# Patient Record
Sex: Female | Born: 1945 | Race: White | Hispanic: No | Smoking: Former smoker
Health system: Southern US, Community
[De-identification: ages and names within clinical notes are randomized; demographics above are authoritative.]

## PROBLEM LIST (undated history)

## (undated) DIAGNOSIS — E785 Hyperlipidemia, unspecified: Secondary | ICD-10-CM

## (undated) DIAGNOSIS — N189 Chronic kidney disease, unspecified: Secondary | ICD-10-CM

## (undated) DIAGNOSIS — I509 Heart failure, unspecified: Secondary | ICD-10-CM

## (undated) DIAGNOSIS — J45909 Unspecified asthma, uncomplicated: Secondary | ICD-10-CM

## (undated) DIAGNOSIS — I10 Essential (primary) hypertension: Secondary | ICD-10-CM

## (undated) DIAGNOSIS — I4891 Unspecified atrial fibrillation: Secondary | ICD-10-CM

## (undated) DIAGNOSIS — J449 Chronic obstructive pulmonary disease, unspecified: Secondary | ICD-10-CM

## (undated) HISTORY — PX: OTHER SURGICAL HISTORY: SHX169

---

## 2008-10-21 ENCOUNTER — Ambulatory Visit: Payer: Self-pay | Admitting: Family Medicine

## 2011-08-04 ENCOUNTER — Ambulatory Visit: Payer: Self-pay | Admitting: Internal Medicine

## 2011-11-06 ENCOUNTER — Inpatient Hospital Stay: Payer: Self-pay | Admitting: Internal Medicine

## 2011-11-06 ENCOUNTER — Ambulatory Visit: Payer: Self-pay

## 2012-06-02 ENCOUNTER — Ambulatory Visit: Payer: Self-pay

## 2013-01-31 ENCOUNTER — Ambulatory Visit: Payer: Self-pay | Admitting: Family Medicine

## 2015-10-20 ENCOUNTER — Other Ambulatory Visit: Payer: Self-pay | Admitting: Internal Medicine

## 2015-10-20 ENCOUNTER — Ambulatory Visit
Admission: RE | Admit: 2015-10-20 | Discharge: 2015-10-20 | Disposition: A | Discharge status: Home / Self Care | Payer: BLUE CROSS/BLUE SHIELD | Source: Ambulatory Visit | Attending: Internal Medicine | Admitting: Internal Medicine

## 2015-10-20 DIAGNOSIS — I517 Cardiomegaly: Secondary | ICD-10-CM | POA: Diagnosis not present

## 2015-10-20 DIAGNOSIS — J189 Pneumonia, unspecified organism: Secondary | ICD-10-CM | POA: Diagnosis not present

## 2015-10-20 DIAGNOSIS — R0602 Shortness of breath: Secondary | ICD-10-CM | POA: Diagnosis present

## 2015-10-20 DIAGNOSIS — J90 Pleural effusion, not elsewhere classified: Secondary | ICD-10-CM | POA: Insufficient documentation

## 2015-10-20 DIAGNOSIS — J449 Chronic obstructive pulmonary disease, unspecified: Secondary | ICD-10-CM | POA: Insufficient documentation

## 2015-11-07 ENCOUNTER — Ambulatory Visit
Admission: RE | Admit: 2015-11-07 | Discharge: 2015-11-07 | Disposition: A | Discharge status: Home / Self Care | Payer: BLUE CROSS/BLUE SHIELD | Source: Ambulatory Visit | Attending: Internal Medicine | Admitting: Internal Medicine

## 2015-11-07 ENCOUNTER — Other Ambulatory Visit: Payer: Self-pay | Admitting: Internal Medicine

## 2015-11-07 DIAGNOSIS — R9389 Abnormal findings on diagnostic imaging of other specified body structures: Secondary | ICD-10-CM

## 2015-11-07 DIAGNOSIS — J439 Emphysema, unspecified: Secondary | ICD-10-CM | POA: Diagnosis not present

## 2015-11-07 DIAGNOSIS — R918 Other nonspecific abnormal finding of lung field: Secondary | ICD-10-CM | POA: Insufficient documentation

## 2015-11-28 ENCOUNTER — Emergency Department: Payer: Medicare Other

## 2015-11-28 ENCOUNTER — Inpatient Hospital Stay
Admission: EM | Admit: 2015-11-28 | Discharge: 2015-11-30 | DRG: 190 | Disposition: A | Discharge status: Home / Self Care | Payer: Medicare Other | Attending: Internal Medicine | Admitting: Internal Medicine

## 2015-11-28 ENCOUNTER — Encounter: Payer: Self-pay | Admitting: Emergency Medicine

## 2015-11-28 DIAGNOSIS — J9602 Acute respiratory failure with hypercapnia: Secondary | ICD-10-CM | POA: Diagnosis present

## 2015-11-28 DIAGNOSIS — Z801 Family history of malignant neoplasm of trachea, bronchus and lung: Secondary | ICD-10-CM | POA: Diagnosis not present

## 2015-11-28 DIAGNOSIS — Z8249 Family history of ischemic heart disease and other diseases of the circulatory system: Secondary | ICD-10-CM

## 2015-11-28 DIAGNOSIS — Z7901 Long term (current) use of anticoagulants: Secondary | ICD-10-CM

## 2015-11-28 DIAGNOSIS — I509 Heart failure, unspecified: Secondary | ICD-10-CM | POA: Diagnosis present

## 2015-11-28 DIAGNOSIS — F17210 Nicotine dependence, cigarettes, uncomplicated: Secondary | ICD-10-CM | POA: Diagnosis present

## 2015-11-28 DIAGNOSIS — G4730 Sleep apnea, unspecified: Secondary | ICD-10-CM | POA: Diagnosis present

## 2015-11-28 DIAGNOSIS — E785 Hyperlipidemia, unspecified: Secondary | ICD-10-CM | POA: Diagnosis present

## 2015-11-28 DIAGNOSIS — J9601 Acute respiratory failure with hypoxia: Secondary | ICD-10-CM | POA: Diagnosis present

## 2015-11-28 DIAGNOSIS — J441 Chronic obstructive pulmonary disease with (acute) exacerbation: Secondary | ICD-10-CM | POA: Diagnosis present

## 2015-11-28 DIAGNOSIS — J189 Pneumonia, unspecified organism: Secondary | ICD-10-CM | POA: Diagnosis present

## 2015-11-28 DIAGNOSIS — I4891 Unspecified atrial fibrillation: Secondary | ICD-10-CM | POA: Diagnosis present

## 2015-11-28 DIAGNOSIS — R0602 Shortness of breath: Secondary | ICD-10-CM | POA: Diagnosis present

## 2015-11-28 DIAGNOSIS — I429 Cardiomyopathy, unspecified: Secondary | ICD-10-CM | POA: Diagnosis present

## 2015-11-28 DIAGNOSIS — R002 Palpitations: Secondary | ICD-10-CM | POA: Diagnosis present

## 2015-11-28 DIAGNOSIS — J45909 Unspecified asthma, uncomplicated: Secondary | ICD-10-CM | POA: Diagnosis present

## 2015-11-28 DIAGNOSIS — N189 Chronic kidney disease, unspecified: Secondary | ICD-10-CM | POA: Diagnosis present

## 2015-11-28 DIAGNOSIS — J90 Pleural effusion, not elsewhere classified: Secondary | ICD-10-CM | POA: Diagnosis present

## 2015-11-28 HISTORY — DX: Essential (primary) hypertension: I10

## 2015-11-28 HISTORY — DX: Unspecified asthma, uncomplicated: J45.909

## 2015-11-28 HISTORY — DX: Chronic obstructive pulmonary disease, unspecified: J44.9

## 2015-11-28 HISTORY — DX: Heart failure, unspecified: I50.9

## 2015-11-28 LAB — COMPREHENSIVE METABOLIC PANEL
ALK PHOS: 68 U/L (ref 38–126)
ALT: 25 U/L (ref 14–54)
ANION GAP: 9 (ref 5–15)
AST: 20 U/L (ref 15–41)
Albumin: 3.8 g/dL (ref 3.5–5.0)
BUN: 18 mg/dL (ref 6–20)
CALCIUM: 9.6 mg/dL (ref 8.9–10.3)
CHLORIDE: 99 mmol/L — AB (ref 101–111)
CO2: 37 mmol/L — AB (ref 22–32)
CREATININE: 0.74 mg/dL (ref 0.44–1.00)
Glucose, Bld: 110 mg/dL — ABNORMAL HIGH (ref 65–99)
Potassium: 4.7 mmol/L (ref 3.5–5.1)
SODIUM: 145 mmol/L (ref 135–145)
Total Bilirubin: 0.7 mg/dL (ref 0.3–1.2)
Total Protein: 6.9 g/dL (ref 6.5–8.1)

## 2015-11-28 LAB — CBC WITH DIFFERENTIAL/PLATELET
BASOS ABS: 0.1 10*3/uL (ref 0–0.1)
Basophils Relative: 1 %
EOS ABS: 0 10*3/uL (ref 0–0.7)
EOS PCT: 0 %
HCT: 45.3 % (ref 35.0–47.0)
HEMOGLOBIN: 14.2 g/dL (ref 12.0–16.0)
LYMPHS ABS: 0.9 10*3/uL — AB (ref 1.0–3.6)
Lymphocytes Relative: 8 %
MCH: 30.2 pg (ref 26.0–34.0)
MCHC: 31.3 g/dL — ABNORMAL LOW (ref 32.0–36.0)
MCV: 96.6 fL (ref 80.0–100.0)
Monocytes Absolute: 0.4 10*3/uL (ref 0.2–0.9)
Monocytes Relative: 4 %
NEUTROS PCT: 87 %
Neutro Abs: 10.2 10*3/uL — ABNORMAL HIGH (ref 1.4–6.5)
PLATELETS: 337 10*3/uL (ref 150–440)
RBC: 4.69 MIL/uL (ref 3.80–5.20)
RDW: 17.5 % — ABNORMAL HIGH (ref 11.5–14.5)
WBC: 11.6 10*3/uL — AB (ref 3.6–11.0)

## 2015-11-28 LAB — BLOOD GAS, ARTERIAL
ALLENS TEST (PASS/FAIL): POSITIVE — AB
Acid-Base Excess: 8.1 mmol/L — ABNORMAL HIGH (ref 0.0–3.0)
Bicarbonate: 37.6 mEq/L — ABNORMAL HIGH (ref 21.0–28.0)
FIO2: 0.24
O2 SAT: 91.4 %
PCO2 ART: 80 mmHg — AB (ref 32.0–48.0)
PH ART: 7.28 — AB (ref 7.350–7.450)
Patient temperature: 37
pO2, Arterial: 70 mmHg — ABNORMAL LOW (ref 83.0–108.0)

## 2015-11-28 LAB — TROPONIN I
TROPONIN I: 0.05 ng/mL — AB (ref <0.031)
TROPONIN I: 0.05 ng/mL — AB (ref <0.031)
TROPONIN I: 0.04 ng/mL — AB (ref <0.031)

## 2015-11-28 LAB — LACTIC ACID, PLASMA
LACTIC ACID, VENOUS: 1 mmol/L (ref 0.5–2.0)
LACTIC ACID, VENOUS: 1 mmol/L (ref 0.5–2.0)

## 2015-11-28 LAB — GLUCOSE, CAPILLARY: GLUCOSE-CAPILLARY: 98 mg/dL (ref 65–99)

## 2015-11-28 LAB — MRSA PCR SCREENING: MRSA BY PCR: NEGATIVE

## 2015-11-28 MED ORDER — IPRATROPIUM-ALBUTEROL 0.5-2.5 (3) MG/3ML IN SOLN
3.0000 mL | Freq: Once | RESPIRATORY_TRACT | Status: AC
  Administered 2015-11-28: 3 mL via RESPIRATORY_TRACT

## 2015-11-28 MED ORDER — TIOTROPIUM BROMIDE MONOHYDRATE 18 MCG IN CAPS
1.0000 | ORAL_CAPSULE | Freq: Every day | RESPIRATORY_TRACT | Status: DC
  Administered 2015-11-28 – 2015-11-30 (×2): 18 ug via RESPIRATORY_TRACT
  Filled 2015-11-28 (×2): qty 5

## 2015-11-28 MED ORDER — FUROSEMIDE 10 MG/ML IJ SOLN
60.0000 mg | Freq: Once | INTRAMUSCULAR | Status: AC
  Administered 2015-11-28: 60 mg via INTRAVENOUS
  Filled 2015-11-28: qty 8

## 2015-11-28 MED ORDER — SODIUM CHLORIDE 0.9 % IV BOLUS (SEPSIS)
1000.0000 mL | Freq: Once | INTRAVENOUS | Status: AC
  Administered 2015-11-28: 1000 mL via INTRAVENOUS

## 2015-11-28 MED ORDER — METHYLPREDNISOLONE SODIUM SUCC 125 MG IJ SOLR
125.0000 mg | Freq: Once | INTRAMUSCULAR | Status: AC
  Administered 2015-11-28: 125 mg via INTRAVENOUS
  Filled 2015-11-28: qty 2

## 2015-11-28 MED ORDER — DABIGATRAN ETEXILATE MESYLATE 75 MG PO CAPS
150.0000 mg | ORAL_CAPSULE | Freq: Two times a day (BID) | ORAL | Status: DC
  Administered 2015-11-28 – 2015-11-30 (×4): 150 mg via ORAL
  Filled 2015-11-28 (×3): qty 2

## 2015-11-28 MED ORDER — FUROSEMIDE 20 MG PO TABS
20.0000 mg | ORAL_TABLET | Freq: Every day | ORAL | Status: DC
  Administered 2015-11-29 – 2015-11-30 (×2): 20 mg via ORAL
  Filled 2015-11-28 (×2): qty 1

## 2015-11-28 MED ORDER — LEVOFLOXACIN IN D5W 750 MG/150ML IV SOLN
750.0000 mg | Freq: Once | INTRAVENOUS | Status: AC
  Administered 2015-11-28: 750 mg via INTRAVENOUS
  Filled 2015-11-28: qty 150

## 2015-11-28 MED ORDER — PROPRANOLOL HCL 20 MG PO TABS
40.0000 mg | ORAL_TABLET | Freq: Two times a day (BID) | ORAL | Status: DC
  Administered 2015-11-28 – 2015-11-30 (×5): 40 mg via ORAL
  Filled 2015-11-28 (×2): qty 2
  Filled 2015-11-28: qty 4
  Filled 2015-11-28 (×2): qty 2
  Filled 2015-11-28: qty 4

## 2015-11-28 MED ORDER — ACETAMINOPHEN 325 MG PO TABS: 650.0000 mg | ORAL_TABLET | Freq: Four times a day (QID) | ORAL | Status: DC | PRN

## 2015-11-28 MED ORDER — SODIUM CHLORIDE 0.9 % IJ SOLN
3.0000 mL | Freq: Two times a day (BID) | INTRAMUSCULAR | Status: DC
  Administered 2015-11-29 – 2015-11-30 (×4): 3 mL via INTRAVENOUS

## 2015-11-28 MED ORDER — VITAMIN D 1000 UNITS PO TABS
1000.0000 [IU] | ORAL_TABLET | Freq: Every day | ORAL | Status: DC
  Administered 2015-11-28 – 2015-11-30 (×3): 1000 [IU] via ORAL
  Filled 2015-11-28 (×3): qty 1

## 2015-11-28 MED ORDER — LISINOPRIL 5 MG PO TABS
2.5000 mg | ORAL_TABLET | Freq: Every day | ORAL | Status: DC
Start: 2015-11-28 — End: 2015-11-30
  Administered 2015-11-28 – 2015-11-29 (×2): 2.5 mg via ORAL
  Filled 2015-11-28: qty 1
  Filled 2015-11-28: qty 3

## 2015-11-28 MED ORDER — DILTIAZEM HCL ER COATED BEADS 240 MG PO CP24
240.0000 mg | ORAL_CAPSULE | Freq: Every day | ORAL | Status: DC
  Administered 2015-11-28 – 2015-11-30 (×3): 240 mg via ORAL
  Filled 2015-11-28: qty 2
  Filled 2015-11-28 (×2): qty 1

## 2015-11-28 MED ORDER — DABIGATRAN ETEXILATE MESYLATE 75 MG PO CAPS
150.0000 mg | ORAL_CAPSULE | Freq: Once | ORAL | Status: DC
  Filled 2015-11-28: qty 2

## 2015-11-28 MED ORDER — METHYLPREDNISOLONE SODIUM SUCC 125 MG IJ SOLR
60.0000 mg | Freq: Four times a day (QID) | INTRAMUSCULAR | Status: DC
  Administered 2015-11-28 – 2015-11-29 (×4): 60 mg via INTRAVENOUS
  Filled 2015-11-28 (×4): qty 2

## 2015-11-28 MED ORDER — ATORVASTATIN CALCIUM 20 MG PO TABS
20.0000 mg | ORAL_TABLET | Freq: Every day | ORAL | Status: DC
  Administered 2015-11-28 – 2015-11-30 (×3): 20 mg via ORAL
  Filled 2015-11-28 (×3): qty 1

## 2015-11-28 MED ORDER — DILTIAZEM HCL 100 MG IV SOLR
10.0000 mg/h | Freq: Once | INTRAVENOUS | Status: AC
  Administered 2015-11-28: 10 mg/h via INTRAVENOUS
  Filled 2015-11-28: qty 100

## 2015-11-28 MED ORDER — IPRATROPIUM-ALBUTEROL 0.5-2.5 (3) MG/3ML IN SOLN
3.0000 mL | Freq: Four times a day (QID) | RESPIRATORY_TRACT | Status: DC
  Administered 2015-11-28 – 2015-11-30 (×8): 3 mL via RESPIRATORY_TRACT
  Filled 2015-11-28 (×8): qty 3

## 2015-11-28 MED ORDER — DILTIAZEM HCL 25 MG/5ML IV SOLN
10.0000 mg | Freq: Once | INTRAVENOUS | Status: AC
  Administered 2015-11-28: 10 mg via INTRAVENOUS
  Filled 2015-11-28: qty 5

## 2015-11-28 MED ORDER — DEXTROSE 5 % IV SOLN
5.0000 mg/h | Freq: Once | INTRAVENOUS | Status: AC
  Administered 2015-11-28: 5 mg/h via INTRAVENOUS
  Filled 2015-11-28: qty 100

## 2015-11-28 MED ORDER — MONTELUKAST SODIUM 10 MG PO TABS
10.0000 mg | ORAL_TABLET | Freq: Every day | ORAL | Status: DC
  Administered 2015-11-28 – 2015-11-30 (×3): 10 mg via ORAL
  Filled 2015-11-28 (×3): qty 1

## 2015-11-28 MED ORDER — DILTIAZEM HCL 25 MG/5ML IV SOLN
10.0000 mg | Freq: Once | INTRAVENOUS | Status: AC
  Administered 2015-11-28: 10 mg via INTRAVENOUS

## 2015-11-28 MED ORDER — BUDESONIDE 0.25 MG/2ML IN SUSP
0.2500 mg | Freq: Two times a day (BID) | RESPIRATORY_TRACT | Status: DC
  Administered 2015-11-28 – 2015-11-30 (×5): 0.25 mg via RESPIRATORY_TRACT
  Filled 2015-11-28 (×5): qty 2

## 2015-11-28 MED ORDER — ADULT MULTIVITAMIN W/MINERALS CH
1.0000 | ORAL_TABLET | Freq: Every day | ORAL | Status: DC
  Administered 2015-11-28 – 2015-11-30 (×3): 1 via ORAL
  Filled 2015-11-28 (×3): qty 1

## 2015-11-28 MED ORDER — LEVOFLOXACIN IN D5W 500 MG/100ML IV SOLN
500.0000 mg | INTRAVENOUS | Status: DC
  Filled 2015-11-28: qty 100

## 2015-11-28 MED ORDER — IPRATROPIUM-ALBUTEROL 0.5-2.5 (3) MG/3ML IN SOLN
RESPIRATORY_TRACT | Status: AC
  Filled 2015-11-28: qty 9

## 2015-11-28 MED ORDER — ACETAMINOPHEN 650 MG RE SUPP: 650.0000 mg | Freq: Four times a day (QID) | RECTAL | Status: DC | PRN

## 2015-11-28 NOTE — H&P (Signed)
[place] [facility] [place] NAME: Elaine Decker    MR#:  [id number]  DATE OF BIRTH:  10/14/1946  DATE OF ADMISSION:  11/28/2015  PRIMARY CARE PHYSICIAN: Glendon Axe, MD   REQUESTING/REFERRING PHYSICIAN: Dr Kerman Passey  CHIEF COMPLAINT:   Chief Complaint  Patient presents with  . Shortness of Breath    HISTORY OF PRESENT ILLNESS:  Elaine Decker  is a 69 y.o. female presented with shortness of breath. She states that she's been having trouble breathing for the last 3 weeks. Coughing up beige phlegm. Past few days feeling very fatigued. Hospitalist services were contacted for further evaluation when chest x-ray showed right lower lobe pneumonia and patient was also in rapid atrial fibrillation. The patient has also had some trouble with her vision and she saw her eye doctor recently. She's been having palpitations, cough wheeze and shortness of breath.  PAST MEDICAL HISTORY:   Past Medical History  Diagnosis Date  . COPD (chronic obstructive pulmonary disease) ([place])   . Hypertension   . Asthma   . CHF (congestive heart failure) ([place])     PAST SURGICAL HISTORY:   Past Surgical History  Procedure Laterality Date  . Surgery on the neck      SOCIAL HISTORY:   Social History  Substance Use Topics  . Smoking status: Current Every Day Smoker -- 1.00 packs/day  . Smokeless tobacco: Not on file  . Alcohol Use: No    FAMILY HISTORY:   Family History  Problem Relation Age of Onset  . CAD Mother   . Lung cancer Father     DRUG ALLERGIES:  No Known Allergies  REVIEW OF SYSTEMS:  CONSTITUTIONAL: No fever, positive for fatigue EYES: No blurred or double vision. She's been having some 3 the vision out of her right eye which she saw her eye doctor for and she was referred to a specialist EARS, NOSE, AND THROAT: No tinnitus or ear pain. No sore throat RESPIRATORY: Positive for cough, shortness of breath, and wheezing. No  hemoptysis.  CARDIOVASCULAR: No chest pain. Positive for palpitations GASTROINTESTINAL: No nausea, vomiting, diarrhea or abdominal pain. No blood in bowel movements GENITOURINARY: No dysuria, hematuria.  ENDOCRINE: No polyuria, nocturia,  HEMATOLOGY: No anemia, easy bruising or bleeding SKIN: No rash or lesion. MUSCULOSKELETAL: No joint pain or arthritis.   NEUROLOGIC: No tingling, numbness, weakness.  PSYCHIATRY: No anxiety or depression.   MEDICATIONS AT HOME:   Prior to Admission medications   Medication Sig Start Date End Date Taking? Authorizing Provider  ADVAIR DISKUS 250-50 MCG/DOSE AEPB Take 2 puffs by mouth 2 (two) times daily. 10/28/15  Yes Historical Provider, MD  atorvastatin (LIPITOR) 20 MG tablet Take 20 mg by mouth daily. 11/09/15  Yes Historical Provider, MD  CARTIA XT 240 MG 24 hr capsule Take 1 capsule by mouth daily. 11/05/15  Yes Historical Provider, MD  cholecalciferol (VITAMIN D) 1000 UNITS tablet Take 1,000 Units by mouth daily.   Yes Historical Provider, MD  furosemide (LASIX) 20 MG tablet Take 1 tablet by mouth daily. 11/14/15  Yes Historical Provider, MD  lisinopril (PRINIVIL,ZESTRIL) 2.5 MG tablet Take 1 tablet by mouth daily. 11/07/15  Yes Historical Provider, MD  montelukast (SINGULAIR) 10 MG tablet Take 1 tablet by mouth daily. 11/07/15  Yes Historical Provider, MD  Multiple Vitamin (MULTIVITAMIN) tablet Take 1 tablet by mouth daily.   Yes Historical Provider, MD  PRADAXA 150 MG CAPS capsule Take 1 capsule by mouth 2 (two) times  daily. 11/05/15  Yes Historical Provider, MD  propranolol (INDERAL) 40 MG tablet Take 1 tablet by mouth 2 (two) times daily. 09/13/15  Yes Historical Provider, MD  SPIRIVA RESPIMAT 1.25 MCG/ACT AERS Take 1 puff by mouth daily. 11/02/15  Yes Historical Provider, MD  VENTOLIN HFA 108 (90 BASE) MCG/ACT inhaler Take 2 puffs by mouth every 4 (four) hours as needed. 11/05/15  Yes Historical Provider, MD      VITAL SIGNS:  Blood pressure  141/88, pulse 104, temperature 98.7 F (37.1 C), temperature source Oral, resp. rate 19, height 5\' 3"  (1.6 m), weight 79.833 kg (176 lb), SpO2 93 %. Heart weight when I saw her was 155 PHYSICAL EXAMINATION:  GENERAL:  69 y.o.-year-old patient lying in the bed with no acute distress.  EYES: Pupils equal, round, reactive to light and accommodation. No scleral icterus. Extraocular muscles intact.  HEENT: Head atraumatic, normocephalic. Oropharynx and nasopharynx clear.  NECK:  Supple, no jugular venous distention. No thyroid enlargement, no tenderness.  LUNGS: Decreased breath sounds bilaterally, positive wheezing, no rales,rhonchi or crepitation. No use of accessory muscles of respiration.  CARDIOVASCULAR: S1, S2 irregularly irregular tachycardic. 2/6 systolic murmur, no rubs, or gallops.  ABDOMEN: Soft, nontender, nondistended. Bowel sounds present. No organomegaly or mass.  EXTREMITIES: 2+ pedal edema, no cyanosis, or clubbing.  NEUROLOGIC: Cranial nerves II through XII are intact. Muscle strength 5/5 in all extremities. Sensation intact. Gait not checked.  PSYCHIATRIC: The patient is alert and oriented x 3.  SKIN: Chronic lower extremity discoloration  LABORATORY PANEL:   CBC  Recent Labs Lab 11/28/15 0923  WBC 11.6*  HGB 14.2  HCT 45.3  PLT 337   ------------------------------------------------------------------------------------------------------------------  Chemistries   Recent Labs Lab 11/28/15 0923  NA 145  K 4.7  CL 99*  CO2 37*  GLUCOSE 110*  BUN 18  CREATININE 0.74  CALCIUM 9.6  AST 20  ALT 25  ALKPHOS 68  BILITOT 0.7   ------------------------------------------------------------------------------------------------------------------  Cardiac Enzymes  Recent Labs Lab 11/28/15 0923  TROPONINI 0.05*   ------------------------------------------------------------------------------------------------------------------  RADIOLOGY:  Dg Chest Portable 1  View  11/28/2015  CLINICAL DATA:  Short of breath EXAM: PORTABLE CHEST 1 VIEW COMPARISON:  11/07/2015 FINDINGS: Progression of right lower lobe airspace disease and right pleural effusion. Mild left lower lobe atelectasis and possibly minimal effusion Mild vascular congestion without edema. IMPRESSION: Right lower lobe airspace disease and right effusion has developed since the prior study. This may represent pneumonia or possibly heart failure. Mild left lower lobe atelectasis and minimal effusion. Electronically Signed   By: Franchot Gallo M.D.   On: 11/28/2015 09:47    EKG:   Atrial fibrillation rapid ventricular response  IMPRESSION AND PLAN:   1. Acute hypoxic hypercarbic respiratory failure. Patient placed on oxygen supplementation. Will put in for a consult for Dr. Devona Konig her pulmonologist 2. Rapid atrial fibrillation- patient was placed on a Cardizem drip in the ER and will titrate up to 15 mg per hour. Patient also takes low-dose beta blocker which I will restart. I will also try to restart her oral Cardizem to try to get off the drip. Cardiology consult with her cardiologist Dr. Lorinda Creed 3. COPD exacerbation, right lower lobe pneumonia- start IV Solu-Medrol, budesonide nebulizers, DuoNeb nebulizers and IV Levaquin. 4. History of sleep apnea- I will try BiPAP at night with her CO2 retention. May be candidate for Trilogy at home. 5. Tobacco abuse smoking cessation counseling done 3 minutes by me. Patient refused nicotine patch. 6. Essential  hypertension continue usual medications 7. History of congestive heart failure- ER physician gave a dose of Lasix but I think this is more secondary to COPD exacerbation and pneumonia.  All the records are reviewed and case discussed with ED provider. Management plans discussed with the patient, family and they are in agreement.  CODE STATUS: Full code  TOTAL TIME TAKING CARE OF THIS PATIENT: 50 minutes, patient will be admitted to the  critical care unit.    Loletha Grayer M.D on 11/28/2015 at 2:19 PM  Between 7am to 6pm - Pager - [phone]  After 6pm call admission pager [facility]  Office  [phone]  CC: Primary care physician; Glendon Axe, MD

## 2015-11-28 NOTE — ED Provider Notes (Signed)
[facility] Emergency Department Provider Note  Time seen: 9:30 AM  I have reviewed the triage vital signs and the nursing notes.   HISTORY  Chief Complaint Shortness of Breath    HPI Elaine Decker is a 69 y.o. female with a past medical history of CHF, CK D, COPD on 2 L O2 24/7, atrial fibrillation on Pradaxa, who presents to the emergency department with shortness of breath. According to the patient for the past 2 days she has been progressively more short of breath. Mild cough but states that is baseline for her. Patient only able to talk in 1-2 word sentences due to shortness of breath. Denies fever. Denies chest pain. On arrival to the emergency department and the patient has a oxygen saturation of 74% on 2 L O2.    PMHx: . Atrial fibrillation  . CHF (congestive heart failure)  . Chronic kidney disease  Chronic renal insufficiency  . COPD (chronic obstructive pulmonary disease)  . Hx of myocardial perfusion scan 05/01/07  EF 59%, normal. Sub max exercise.  Mar[place] Kitchen Hyperlipidemia  . Hypertension    There are no active problems to display for this patient.   No past surgical history on file.  No current outpatient prescriptions on file.  Allergies Review of patient's allergies indicates not on file.  No family history on file.  Social History Social History  Substance Use Topics  . Smoking status: Not on file  . Smokeless tobacco: Not on file  . Alcohol Use: Not on file    Review of Systems Constitutional: Negative for fever Cardiovascular: Negative for chest pain. Respiratory: Positive for shortness of breath. Gastrointestinal: Negative for abdominal pain Musculoskeletal: Negative for back pain. Neurological: Negative for headache 10-point ROS otherwise negative.  ____________________________________________   PHYSICAL EXAM:  VITAL SIGNS: ED Triage Vitals  Enc Vitals Group     BP 11/28/15 0917 133/78 mmHg     Pulse Rate  11/28/15 0917 76     Resp 11/28/15 0917 22     Temp 11/28/15 0917 98.9 F (37.2 C)     Temp Source 11/28/15 0917 Oral     SpO2 11/28/15 0917 86 %     Weight 11/28/15 0917 173 lb (78.472 kg)     Height 11/28/15 0917 5\' 3"  (1.6 m)     Head Cir --      Peak Flow --      Pain Score 11/28/15 0929 8     Pain Loc --      Pain Edu? --      Excl. in [place]? --     Constitutional: Alert and oriented. Well appearing and in no distress. Eyes: Normal exam ENT   Head: Normocephalic and atraumatic   Mouth/Throat: Mucous membranes are moist. Cardiovascular: Irregular rhythm, rate around 150 bpm Respiratory: Mild tachypnea, very diminished breath sounds bilaterally. No obvious wheezes, rales, rhonchi. Gastrointestinal: Soft and nontender. No distention.   Musculoskeletal: Nontender with normal range of motion in all extremities. 2+ lower extremity edema bilaterally. Neurologic:  Normal speech and language. No gross focal neurologic deficits are appreciated. Speech is normal. Skin:  Skin is warm, dry and intact.  Psychiatric: Mood and affect are normal. Speech and behavior are normal.  ____________________________________________    EKG  EKG reviewed and interpreted by myself shows atrial fibrillation at 153 bpm, narrow QRS, normal axis, nonspecific ST changes present. Mild lateral ST depressions likely due to demand ischemia. No ST elevation noted  ____________________________________________  RADIOLOGY  Right lower airspace disease, effusion versus pneumonia versus edema  ____________________________________________  CRITICAL CARE Performed by: Harvest Dark   Total critical care time: 30 minutes  Critical care time was exclusive of separately billable procedures and treating other patients.  Critical care was necessary to treat or prevent imminent or life-threatening deterioration.  Critical care was time spent personally by me on the following activities: development  of treatment plan with patient and/or surrogate as well as nursing, discussions with consultants, evaluation of patient's response to treatment, examination of patient, obtaining history from patient or surrogate, ordering and performing treatments and interventions, ordering and review of laboratory studies, ordering and review of radiographic studies, pulse oximetry and re-evaluation of patient's condition.      INITIAL IMPRESSION / ASSESSMENT AND PLAN / ED COURSE  Pertinent labs & imaging results that were available during my care of the patient were reviewed by me and considered in my medical decision making (see chart for details).  Patient presents the emergency department with dyspnea 2 days. Patient has very diminished breath sounds bilaterally, 74% oxygen saturation on 2 L O2. Patient has a lower extremity edema, history of COPD as well as CHF, it is unclear which she is suffering from today. I have ordered BiPAP, we will begin with DuoNeb times, we will closely monitor in the emergency department while awaiting lab results.  ----------------------------------------- 9:54 AM on 11/28/2015 -----------------------------------------  Heart rate continues to be elevated most consistent with age of fibrillation with rapid ventricular response. Blood pressure is elevated in the 140s, we will give diltiazem 10 mg IV and continue to closely monitor.  ----------------------------------------- 10:21 AM on 11/28/2015 -----------------------------------------  Patient feeling much better after breathing treatments, currently satting 90-93 percent on 2 L. I have canceled the BiPAP. Given the vast improvement after breathing treatments, most likely COPD exacerbation. Chest x-ray reading as possible infiltrate versus edema and right lung, patient states she was recently treated for pneumonia 3 weeks ago, which this could be related. We'll cover with antibiotics, Solu-Medrol, and continue to monitor  closely in the emergency department. The patient's heart rate briefly came down after diltiazem push, will repeat push, if unimproved after second bolus we'll place the patient on a drip.  Sometimes in patient's heart rate remains in the 130s/140s status post 2 rounds of IV diltiazem. We'll start on an IV diltiazem drip at this time. Patient is doing much better from a respiratory standpoint currently satting 95-96 percent on 4 L. We will cover with antibiotics, admit to the hospital for age of fibrillation with rapid ventricular response and COPD exacerbation.  ____________________________________________   FINAL CLINICAL IMPRESSION(S) / ED DIAGNOSES  Dyspnea COPD exacerbation Atrial fibrillation with rapid ventricular response  Harvest Dark, MD 11/28/15 1110

## 2015-11-28 NOTE — Consult Note (Signed)
[facility] NOTE  Patient ID: Elaine Decker MRN: [id number] DOB/AGE: 1946/07/11 69 y.o.  Admit date: 11/28/2015 Referring Physician Dr. Leslye Peer Primary Physician Dr. Lilla Shook Primary Cardiologist Dr. 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  Patient recently been treated for pneumonia.  She began noting an irregular heartbeat present to emergency room where she was noted be in atrial fibrillation with rapid ventricular response.  At home she had been treated with lisinopril and cardia XT.  She had been anticoagulated with Pradaxa.  She was placed on a Cardizem drip.  Currently is in atrial fibrillation with a rate of approximately 70 May, 1985. She continues to smoke cigarettes.  She is currently hemodynamically stable.  ROS Review of Systems - History obtained from chart review and the patient General ROS: positive for  - chills Respiratory ROS: positive for - shortness of breath Cardiovascular ROS: positive for - dyspnea on exertion, palpitations, rapid heart rate and shortness of breath Gastrointestinal ROS: no abdominal pain, change in bowel habits, or black or bloody stools Musculoskeletal ROS: negative Neurological ROS: no TIA or stroke symptoms   Past Medical History  Diagnosis Date  . COPD (chronic obstructive pulmonary disease) ([place])   . Hypertension   . Asthma   . CHF (congestive heart failure) ([facility])     Family History  Problem Relation Age of Onset  . CAD Mother   . Lung cancer Father     Social History   Social History  . Marital Status: Divorced    Spouse Name: N/A  . Number of Children: N/A  . Years of Education: N/A   Occupational History  . Not on file.   Social History Main Topics  . Smoking status: Current Every Day Smoker -- 1.00  packs/day  . Smokeless tobacco: Not on file  . Alcohol Use: No  . Drug Use: Not on file  . Sexual Activity: Not on file   Other Topics Concern  . Not on file   Social History Narrative  . No narrative on file    Past Surgical History  Procedure Laterality Date  . Surgery on the neck       Prescriptions prior to admission  Medication Sig Dispense Refill Last Dose  . ADVAIR DISKUS 250-50 MCG/DOSE AEPB Take 2 puffs by mouth 2 (two) times daily.  5 11/27/2015 at Unknown time  . atorvastatin (LIPITOR) 20 MG tablet Take 20 mg by mouth daily.  6 11/27/2015 at Unknown time  . CARTIA XT 240 MG 24 hr capsule Take 1 capsule by mouth daily.  6 11/27/2015 at Unknown time  . cholecalciferol (VITAMIN D) 1000 UNITS tablet Take 1,000 Units by mouth daily.   11/27/2015 at Unknown time  . furosemide (LASIX) 20 MG tablet Take 1 tablet by mouth daily.  2 11/27/2015 at Unknown time  . lisinopril (PRINIVIL,ZESTRIL) 2.5 MG tablet Take 1 tablet by mouth daily.  6 11/27/2015 at Unknown time  . montelukast (SINGULAIR) 10 MG tablet Take 1 tablet by mouth daily.  5 11/27/2015 at Unknown time  . Multiple Vitamin (MULTIVITAMIN) tablet Take 1 tablet by mouth daily.   11/27/2015 at Unknown time  . PRADAXA 150 MG CAPS capsule Take 1 capsule by mouth 2 (two) times daily.  6 11/27/2015 at Unknown time  . propranolol (INDERAL)  40 MG tablet Take 1 tablet by mouth 2 (two) times daily.  3 11/27/2015 at Unknown time  . SPIRIVA RESPIMAT 1.25 MCG/ACT AERS Take 1 puff by mouth daily.  5 11/27/2015 at Unknown time  . VENTOLIN HFA 108 (90 BASE) MCG/ACT inhaler Take 2 puffs by mouth every 4 (four) hours as needed.  5 11/27/2015 at Unknown time    Physical Exam: Blood pressure 118/70, pulse 89, temperature 98.4 F (36.9 C), temperature source Oral, resp. rate 19, height 5\' 3"  (1.6 m), weight 79.833 kg (176 lb), SpO2 92 %.    General appearance: alert and cooperative Resp: rhonchi bibasilar Cardio: irregularly irregular  rhythm GI: soft, non-tender; bowel sounds normal; no masses,  no organomegaly Extremities: extremities normal, atraumatic, no cyanosis or edema Neurologic: Grossly normal Labs:   Lab Results  Component Value Date   WBC 11.6* 11/28/2015   HGB 14.2 11/28/2015   HCT 45.3 11/28/2015   MCV 96.6 11/28/2015   PLT 337 11/28/2015    Recent Labs Lab 11/28/15 0923  NA 145  K 4.7  CL 99*  CO2 37*  BUN 18  CREATININE 0.74  CALCIUM 9.6  PROT 6.9  BILITOT 0.7  ALKPHOS 68  ALT 25  AST 20  GLUCOSE 110*   Lab Results  Component Value Date   TROPONINI 0.04* 11/28/2015      Radiology:   Probable pneumonia with no pulmonary edema EKG:  Atrial fibrillation with variable ventricular response  ASSESSMENT AND PLAN:   Patient's history of paroxysmally atrial fibrillation who has been treated as an outpatient with cardia XT as well as anticoagulated with Pradaxa.  She a cardiomyopathy which is mild at 45%.  She was treated for pneumonia.  She noted her heart rate was somewhat irregular and presented to the emergency room where she was noted be in atrial fibrillation with rapid ventricular response.  Rate is being controlled with Cardizem IV.  Will wean off of IV Cardizem and continue with p.o. Cardizem using 30 mg of Cardizem short-acting for breakthrough.  Will continue with Pradaxa.  Transferred to telemetry in follow-up consider discharge when rate is stable. Signed: Teodoro Spray MD, [facility] 11/28/2015, 7:56 PM

## 2015-11-28 NOTE — ED Notes (Signed)
Reports sob and states she feels disoriented.

## 2015-11-28 NOTE — ED Notes (Signed)
While on 2L nasal cannula, pt desat to 74% when up to bedside commode; pt placed on 4L O2 to maintain sat >92%.

## 2015-11-28 NOTE — Progress Notes (Signed)
Cardizem drip discontinued per order Dr. Ubaldo Glassing.

## 2015-11-28 NOTE — ED Notes (Signed)
Past few days c/o sob, cough, family found pt somewhat disoriented, was not on her o2

## 2015-11-29 LAB — BASIC METABOLIC PANEL
Anion gap: 4 — ABNORMAL LOW (ref 5–15)
BUN: 20 mg/dL (ref 6–20)
CALCIUM: 9 mg/dL (ref 8.9–10.3)
CHLORIDE: 97 mmol/L — AB (ref 101–111)
CO2: 41 mmol/L — AB (ref 22–32)
CREATININE: 0.83 mg/dL (ref 0.44–1.00)
GFR calc Af Amer: >60 mL/min (ref >60)
GFR calc non Af Amer: >60 mL/min (ref >60)
GLUCOSE: 176 mg/dL — AB (ref 65–99)
Potassium: 5 mmol/L (ref 3.5–5.1)
Sodium: 142 mmol/L (ref 135–145)

## 2015-11-29 LAB — CBC
HCT: 42.5 % (ref 35.0–47.0)
Hemoglobin: 13.7 g/dL (ref 12.0–16.0)
MCH: 30.8 pg (ref 26.0–34.0)
MCHC: 32.3 g/dL (ref 32.0–36.0)
MCV: 95.3 fL (ref 80.0–100.0)
PLATELETS: 223 10*3/uL (ref 150–440)
RBC: 4.45 MIL/uL (ref 3.80–5.20)
RDW: 16.1 % — ABNORMAL HIGH (ref 11.5–14.5)
WBC: 5.7 10*3/uL (ref 3.6–11.0)

## 2015-11-29 MED ORDER — PREDNISONE 50 MG PO TABS
50.0000 mg | ORAL_TABLET | Freq: Every day | ORAL | Status: DC
  Administered 2015-11-30: 50 mg via ORAL
  Filled 2015-11-29: qty 1

## 2015-11-29 MED ORDER — AZITHROMYCIN 500 MG IV SOLR
250.0000 mg | INTRAVENOUS | Status: DC
  Administered 2015-11-29: 250 mg via INTRAVENOUS
  Filled 2015-11-29 (×2): qty 250

## 2015-11-29 MED ORDER — DEXTROSE 5 % IV SOLN
1.0000 g | INTRAVENOUS | Status: DC
  Administered 2015-11-29: 1 g via INTRAVENOUS
  Filled 2015-11-29 (×2): qty 10

## 2015-11-29 NOTE — Progress Notes (Signed)
A&O. Up with one assist. Slept with bipap on all night. No complaints. IV solumedrol given. On antibiotics.

## 2015-11-29 NOTE — Progress Notes (Signed)
[place]  SUBJECTIVE: Remains in atrial fib with controlled rate.  Patient is still short of breath with productive cough.  Will continue to control rate with oral medications and follow heart rate.  Would defer aggressive attempts at cardioversion until her pulmonary status stabilizes.  Continue with anticoagulation with Pradaxa.   Filed Vitals:   11/29/15 1100 11/29/15 1220 11/29/15 1959 11/29/15 2009  BP:  132/82 122/75   Pulse:  106 100   Temp:  97.6 F (36.4 C) 97.8 F (36.6 C)   TempSrc:  Oral Oral   Resp:  20 18   Height:      Weight:      SpO2: 95% 92% 91% 92%    Intake/Output Summary (Last 24 hours) at 11/29/15 2032 Last data filed at 11/29/15 1803  Gross per 24 hour  Intake    840 ml  Output   1275 ml  Net   -435 ml    LABS: Basic Metabolic Panel:  Recent Labs  11/28/15 0923 11/29/15 0504  NA 145 142  K 4.7 5.0  CL 99* 97*  CO2 37* 41*  GLUCOSE 110* 176*  BUN 18 20  CREATININE 0.74 0.83  CALCIUM 9.6 9.0   Liver Function Tests:  Recent Labs  11/28/15 0923  AST 20  ALT 25  ALKPHOS 68  BILITOT 0.7  PROT 6.9  ALBUMIN 3.8   No results for input(s): LIPASE, AMYLASE in the last 72 hours. CBC:  Recent Labs  11/28/15 0923 11/29/15 0504  WBC 11.6* 5.7  NEUTROABS 10.2*  --   HGB 14.2 13.7  HCT 45.3 42.5  MCV 96.6 95.3  PLT 337 223   Cardiac Enzymes:  Recent Labs  11/28/15 0923 11/28/15 1442 11/28/15 1802  TROPONINI 0.05* 0.05* 0.04*   BNP: Invalid input(s): POCBNP D-Dimer: No results for input(s): DDIMER in the last 72 hours. Hemoglobin A1C: No results for input(s): HGBA1C in the last 72 hours. Fasting Lipid Panel: No results for input(s): CHOL, HDL, LDLCALC, TRIG, CHOLHDL, LDLDIRECT in the last 72 hours. Thyroid Function Tests: No results for input(s): TSH, T4TOTAL, T3FREE, THYROIDAB in the last 72 hours.  Invalid input(s): FREET3 Anemia Panel: No results for input(s): VITAMINB12,  FOLATE, FERRITIN, TIBC, IRON, RETICCTPCT in the last 72 hours.   Physical Exam: Blood pressure 122/75, pulse 100, temperature 97.8 F (36.6 C), temperature source Oral, resp. rate 18, height 5\' 3"  (1.6 m), weight 79.833 kg (176 lb), SpO2 92 %.    General appearance: alert and cooperative Resp: rhonchi bilaterally Cardio: irregularly irregular rhythm GI: soft, non-tender; bowel sounds normal; no masses,  no organomegaly Neurologic: Grossly normal  TELEMETRY: Reviewed telemetry pt in   Atrial fibrillation with variable ventricular response  ASSESSMENT AND PLAN:  Active Problems:   Rapid atrial fibrillation ([place]) -continue with rate control with Cardizem at 240 mg daily.  Continue with anticoagulation with dabigatran.  Will consider increasing Cardizem showed a rate increase however for now will continued rate control.  Would not aggressively try to cardiovert until pulmonary status stabilizes.    Teodoro Spray., MD, [facility] 11/29/2015 8:32 PM

## 2015-11-29 NOTE — Progress Notes (Signed)
[facility] [place] NAME: Elaine Decker    MR#:  [id number]  DATE OF BIRTH:  [birth year]  SUBJECTIVE:  Pt came in with increasing sob and cough Feels better today  REVIEW OF SYSTEMS:   Review of Systems  Constitutional: Negative for fever, chills and weight loss.  HENT: Negative for ear discharge, ear pain and nosebleeds.   Eyes: Negative for blurred vision, pain and discharge.  Respiratory: Positive for cough and shortness of breath. Negative for sputum production, wheezing and stridor.   Cardiovascular: Negative for chest pain, palpitations, orthopnea and PND.  Gastrointestinal: Negative for nausea, vomiting, abdominal pain and diarrhea.  Genitourinary: Negative for urgency and frequency.  Musculoskeletal: Negative for back pain and joint pain.  Neurological: Positive for weakness. Negative for sensory change, speech change and focal weakness.  Psychiatric/Behavioral: Negative for depression and hallucinations. The patient is not nervous/anxious.   All other systems reviewed and are negative.  Tolerating Diet:yes Tolerating PT: not needed  DRUG ALLERGIES:  No Known Allergies  VITALS:  Blood pressure 132/82, pulse 106, temperature 97.6 F (36.4 C), temperature source Oral, resp. rate 20, height 5\' 3"  (1.6 m), weight 176 lb (79.833 kg), SpO2 92 %.  PHYSICAL EXAMINATION:   Physical Exam  GENERAL:  69 y.o.-year-old patient lying in the bed with no acute distress.  EYES: Pupils equal, round, reactive to light and accommodation. No scleral icterus. Extraocular muscles intact.  HEENT: Head atraumatic, normocephalic. Oropharynx and nasopharynx clear.  NECK:  Supple, no jugular venous distention. No thyroid enlargement, no tenderness.  LUNGS: decreased breath sounds bilaterally, no wheezing, rales, rhonchi. No use of accessory muscles of respiration.  CARDIOVASCULAR: S1, S2 normal. No murmurs, rubs, or gallops.  ABDOMEN: Soft,  nontender, nondistended. Bowel sounds present. No organomegaly or mass.  EXTREMITIES: No cyanosis, clubbing or edema b/l.    NEUROLOGIC: Cranial nerves II through XII are intact. No focal Motor or sensory deficits b/l.   PSYCHIATRIC: The patient is alert and oriented x 3.  SKIN: No obvious rash, lesion, or ulcer.    LABORATORY PANEL:   CBC  Recent Labs Lab 11/29/15 0504  WBC 5.7  HGB 13.7  HCT 42.5  PLT 223    Chemistries   Recent Labs Lab 11/28/15 0923 11/29/15 0504  NA 145 142  K 4.7 5.0  CL 99* 97*  CO2 37* 41*  GLUCOSE 110* 176*  BUN 18 20  CREATININE 0.74 0.83  CALCIUM 9.6 9.0  AST 20  --   ALT 25  --   ALKPHOS 68  --   BILITOT 0.7  --     Cardiac Enzymes  Recent Labs Lab 11/28/15 1802  TROPONINI 0.04*    RADIOLOGY:  Dg Chest Portable 1 View  11/28/2015  CLINICAL DATA:  Short of breath EXAM: PORTABLE CHEST 1 VIEW COMPARISON:  11/07/2015 FINDINGS: Progression of right lower lobe airspace disease and right pleural effusion. Mild left lower lobe atelectasis and possibly minimal effusion Mild vascular congestion without edema. IMPRESSION: Right lower lobe airspace disease and right effusion has developed since the prior study. This may represent pneumonia or possibly heart failure. Mild left lower lobe atelectasis and minimal effusion. Electronically Signed   By: Franchot Gallo M.D.   On: 11/28/2015 09:47     ASSESSMENT AND PLAN:   Elaine Decker is a 69 y.o. female presented with shortness of breath. She states that she's been having trouble breathing for the last 3 weeks. Coughing up  beige phlegm. Past few days feeling very fatigued   1. Acute hypoxic hypercarbic respiratory failure.  -Patient placed on oxygen supplementation. - consult for Dr. Devona Konig her pulmonologist  2. Rapid atrial fibrillation- patient was placed on a Cardizem drip in the ER and will titrate up to 15 mg per hour.  -wean her off CCB drip and cont oral cardizem and po BB -  Cardiology consult with her cardiologist Dr. Josefa Half  3. COPD exacerbation, right lower lobe pneumonia- - IV Solu-Medrol, budesonide nebulizers, DuoNeb nebulizers and IV Levaquin.  4. History of sleep apnea-  will try BiPAP at night with her CO2 retention. May be candidate for Trilogy at home.  5. Tobacco abuse smoking cessation counseling done 3 minutes by me. Patient refused nicotine patch.  6. Essential hypertension continue usual medications  7. History of congestive heart failure- ER physician gave a dose of Lasix but think this is more secondary to COPD exacerbation and pneumonia.  Case discussed with Care Management/Social Worker. Management plans discussed with the patient, family and they are in agreement.  CODE STATUS: full  DVT Prophylaxis: lovenox  TOTAL TIME TAKING CARE OF THIS PATIENT35 minutes.  >50% time spent on counselling and coordination of care  POSSIBLE D/C 1-2DAYS, DEPENDING ON CLINICAL CONDITION.   Elaine Decker M.D on 11/29/2015 at 1:13 PM  Between 7am to 6pm - Pager - [phone]  After 6pm go to [email or web address] - password [email or web address] [facility]  Office  [phone]  CC: Primary care physician; Glendon Axe, MD

## 2015-11-29 NOTE — Consult Note (Signed)
Pulmonary Critical Care  Initial Consult Note   Elaine Decker [id number] DOB: 01/06/1946 DOA: 11/28/2015  Referring physician: Cordelia Poche, MD PCP: Glendon Axe, MD   Chief Complaint: Acute respiratory failure  HPI: Elaine Decker is a 69 y.o. female with a history of COPD presented to the hospital with increased respiratory distress. Patient was initially admitted to the ICU and placed on BIPAP. She has been started on steroids and antibiotics. She has had a cough and congestion noted. She states that she has had no fevers noted. Patient was felt to have a pneumonia on admission and she was also in Atrial fibrillation. She had no chest pain noted. She had been having some palpitations. She denies having any blood in her sputum. Patient has noted increased fatigue.    Review of Systems:  Constitutional:  No weight loss, night sweats, Fevers, chills, +fatigue.  HEENT:  No headaches, nasal congestion  Cardio-vascular:  No chest pain, anasarca, dizziness, +palpitations  GI:  No heartburn, indigestion, abdominal pain, nausea, vomiting  Resp:  +shortness of breath +productive cough, No coughing up of blood. +wheezing Skin:  no rash or lesions.  Musculoskeletal:  No joint pain or swelling.   Remainder ROS performed and is unremarkable other than noted in HPI  Past Medical History  Diagnosis Date  . COPD (chronic obstructive pulmonary disease) ([place])   . Hypertension   . Asthma   . CHF (congestive heart failure) [facility])    Past Surgical History  Procedure Laterality Date  . Surgery on the neck     Social History:  reports that she has been smoking.  She does not have any smokeless tobacco history on file. She reports that she does not drink alcohol. Her drug history is not on file.  No Known Allergies  Family History  Problem Relation Age of Onset  . CAD Mother   . Lung cancer Father     Prior to Admission medications   Medication Sig Start Date End Date Taking?  Authorizing Provider  ADVAIR DISKUS 250-50 MCG/DOSE AEPB Take 2 puffs by mouth 2 (two) times daily. 10/28/15  Yes Historical Provider, MD  atorvastatin (LIPITOR) 20 MG tablet Take 20 mg by mouth daily. 11/09/15  Yes Historical Provider, MD  CARTIA XT 240 MG 24 hr capsule Take 1 capsule by mouth daily. 11/05/15  Yes Historical Provider, MD  cholecalciferol (VITAMIN D) 1000 UNITS tablet Take 1,000 Units by mouth daily.   Yes Historical Provider, MD  furosemide (LASIX) 20 MG tablet Take 1 tablet by mouth daily. 11/14/15  Yes Historical Provider, MD  lisinopril (PRINIVIL,ZESTRIL) 2.5 MG tablet Take 1 tablet by mouth daily. 11/07/15  Yes Historical Provider, MD  montelukast (SINGULAIR) 10 MG tablet Take 1 tablet by mouth daily. 11/07/15  Yes Historical Provider, MD  Multiple Vitamin (MULTIVITAMIN) tablet Take 1 tablet by mouth daily.   Yes Historical Provider, MD  PRADAXA 150 MG CAPS capsule Take 1 capsule by mouth 2 (two) times daily. 11/05/15  Yes Historical Provider, MD  propranolol (INDERAL) 40 MG tablet Take 1 tablet by mouth 2 (two) times daily. 09/13/15  Yes Historical Provider, MD  SPIRIVA RESPIMAT 1.25 MCG/ACT AERS Take 1 puff by mouth daily. 11/02/15  Yes Historical Provider, MD  VENTOLIN HFA 108 (90 BASE) MCG/ACT inhaler Take 2 puffs by mouth every 4 (four) hours as needed. 11/05/15  Yes Historical Provider, MD   Physical Exam: Filed Vitals:   11/29/15 0520 11/29/15 1050 11/29/15 1055 11/29/15 1100  BP: 144/95  Pulse: 100     Temp: 97.7 F (36.5 C)     TempSrc: Oral     Resp: 20     Height:      Weight:      SpO2: 95% 95% 83% 95%    Wt Readings from Last 3 Encounters:  11/28/15 79.833 kg (176 lb)    General:  Appears calm and comfortable Eyes: PERRL, normal lids, irises & conjunctiva ENT: grossly normal hearing, lips & tongue Neck: no LAD, masses or thyromegaly Cardiovascular: IRR, No LE edema. Respiratory: Bilaterally diminished. Normal respiratory effort. Abdomen:  soft, nontender Skin: no rash or induration seen on limited exam Musculoskeletal: grossly normal tone BUE/BLE Psychiatric: grossly normal mood and affect Neurologic: grossly non-focal.          Labs on Admission:  Basic Metabolic Panel:  Recent Labs Lab 11/28/15 0923 11/29/15 0504  NA 145 142  K 4.7 5.0  CL 99* 97*  CO2 37* 41*  GLUCOSE 110* 176*  BUN 18 20  CREATININE 0.74 0.83  CALCIUM 9.6 9.0   Liver Function Tests:  Recent Labs Lab 11/28/15 0923  AST 20  ALT 25  ALKPHOS 68  BILITOT 0.7  PROT 6.9  ALBUMIN 3.8   No results for input(s): LIPASE, AMYLASE in the last 168 hours. No results for input(s): AMMONIA in the last 168 hours. CBC:  Recent Labs Lab 11/28/15 0923 11/29/15 0504  WBC 11.6* 5.7  NEUTROABS 10.2*  --   HGB 14.2 13.7  HCT 45.3 42.5  MCV 96.6 95.3  PLT 337 223   Cardiac Enzymes:  Recent Labs Lab 11/28/15 0923 11/28/15 1442 11/28/15 1802  TROPONINI 0.05* 0.05* 0.04*    BNP (last 3 results) No results for input(s): BNP in the last 8760 hours.  ProBNP (last 3 results) No results for input(s): PROBNP in the last 8760 hours.  CBG:  Recent Labs Lab 11/28/15 1439  GLUCAP 98    Radiological Exams on Admission: Dg Chest Portable 1 View  11/28/2015  CLINICAL DATA:  Short of breath EXAM: PORTABLE CHEST 1 VIEW COMPARISON:  11/07/2015 FINDINGS: Progression of right lower lobe airspace disease and right pleural effusion. Mild left lower lobe atelectasis and possibly minimal effusion Mild vascular congestion without edema. IMPRESSION: Right lower lobe airspace disease and right effusion has developed since the prior study. This may represent pneumonia or possibly heart failure. Mild left lower lobe atelectasis and minimal effusion. Electronically Signed   By: Franchot Gallo M.D.   On: 11/28/2015 09:47      EKG: Independently reviewed.  Assessment/Plan Active Problems:   Rapid atrial fibrillation ([place])   1. Acute respiratory  failure with hypoxia -will continue with oxygen therapy -continue with steroids -BIPAP as needed  2. RLL pneumonia -she has been started on IV antibiotics -there may be a effusion present also would consider getting a CT chest  3. COPD with exacerbation -aggressive bronchodilators -continue with steroids -started on antibiotics  4. Ongoing tobacco Use -had a lengthy discussion regarding smoking cessation  5. Atrial fibrillation -rate controlled  Code Status: full code  Family Communication: brother Disposition Plan: home      I have personally obtained a history, examined the patient, evaluated laboratory and imaging results, formulated the assessment and plan and placed orders.  The Patient requires high complexity decision making for assessment and support.    Allyne Gee, MD [facility] Pulmonary Critical Care Medicine Sleep Medicine

## 2015-11-30 ENCOUNTER — Inpatient Hospital Stay: Payer: Medicare Other

## 2015-11-30 ENCOUNTER — Encounter: Payer: Self-pay | Admitting: Radiology

## 2015-11-30 MED ORDER — FUROSEMIDE 40 MG PO TABS: 40.0000 mg | ORAL_TABLET | Freq: Every day | ORAL | Status: DC

## 2015-11-30 MED ORDER — IOHEXOL 300 MG/ML  SOLN
75.0000 mL | Freq: Once | INTRAMUSCULAR | Status: AC | PRN
  Administered 2015-11-30: 75 mL via INTRAVENOUS

## 2015-11-30 MED ORDER — DILTIAZEM HCL ER COATED BEADS 300 MG PO CP24: 300.0000 mg | ORAL_CAPSULE | Freq: Every day | ORAL | Status: DC

## 2015-11-30 MED ORDER — AZITHROMYCIN 250 MG PO TABS
250.0000 mg | ORAL_TABLET | Freq: Every day | ORAL | Status: DC
  Administered 2015-11-30: 250 mg via ORAL
  Filled 2015-11-30: qty 1

## 2015-11-30 MED ORDER — CEFUROXIME AXETIL 250 MG PO TABS
500.0000 mg | ORAL_TABLET | Freq: Two times a day (BID) | ORAL | Status: DC
  Administered 2015-11-30: 500 mg via ORAL
  Filled 2015-11-30: qty 2

## 2015-11-30 MED ORDER — PREDNISONE 50 MG PO TABS: ORAL_TABLET | ORAL | Status: DC

## 2015-11-30 MED ORDER — LISINOPRIL 5 MG PO TABS
5.0000 mg | ORAL_TABLET | Freq: Every day | ORAL | Status: DC
  Administered 2015-11-30: 5 mg via ORAL
  Filled 2015-11-30: qty 1

## 2015-11-30 MED ORDER — AZITHROMYCIN 250 MG PO TABS: ORAL_TABLET | ORAL | Status: DC

## 2015-11-30 MED ORDER — IPRATROPIUM-ALBUTEROL 0.5-2.5 (3) MG/3ML IN SOLN: 3.0000 mL | Freq: Four times a day (QID) | RESPIRATORY_TRACT | Status: AC

## 2015-11-30 MED ORDER — DILTIAZEM HCL ER COATED BEADS 180 MG PO CP24: 300.0000 mg | ORAL_CAPSULE | Freq: Every day | ORAL | Status: DC

## 2015-11-30 MED ORDER — CEFUROXIME AXETIL 500 MG PO TABS: 500.0000 mg | ORAL_TABLET | Freq: Two times a day (BID) | ORAL | Status: DC

## 2015-11-30 NOTE — Care Management Important Message (Signed)
Important Message  Patient Details  Name: Elaine Decker MRN: [id number] Date of Birth: [birth year]   Medicare Important Message Given:  Yes    Juliann Pulse A Deliyah Muckle 11/30/2015, 11:30 AM

## 2015-11-30 NOTE — Clinical Documentation Improvement (Signed)
Internal Medicine  Can the diagnosis of CHF be further specified?    Acuity - Acute, Chronic, Acute on Chronic   Type - Systolic, Diastolic, Systolic and Diastolic  Other  Clinically Undetermined  Please exercise your independent, professional judgment when responding. A specific answer is not anticipated or expected.   Thank You,  Katoya Amato J Noriah Osgood, RN, BSN Health Information Management [place] [phone] 

## 2015-11-30 NOTE — Discharge Instructions (Signed)
DO not take your PRADAXA till your right sided lung fluid is removed next week

## 2015-11-30 NOTE — Care Management (Signed)
Patient to be discharged home today.  Patient has continuous home O2 provided through [place] patient.  Brother to bring portable O2 for discharge.

## 2015-11-30 NOTE — Clinical Documentation Improvement (Signed)
Internal Medicine  Can the diagnosis of CKD be further specified?   CKD Stage I - GFR greater than or equal to 90  CKD Stage II - GFR 60-89  CKD Stage III - GFR 30-59  CKD Stage IV - GFR 15-29  CKD Stage V - GFR < 15  ESRD (End Stage Renal Disease)  Other condition  Unable to clinically determine   Supporting Information: : (risk factors, signs and symptoms, diagnostics, treatment) 11/29/15: GFR > 60; Crea= 0.83; BUN= 20  Please exercise your independent, professional judgment when responding. A specific answer is not anticipated or expected.   Thank You, Rolm Gala, RN, [place] [phone]

## 2015-11-30 NOTE — Discharge Summary (Signed)
[facility] NAME: Elaine Decker    MR#:  [id number]  [place] OF BIRTH:  08-27-1946  DATE OF ADMISSION:  11/28/2015 ADMITTING PHYSICIAN: Elaine Grayer, MD  DATE OF DISCHARGE: 11/30/15  PRIMARY CARE PHYSICIAN: Singh,Jasmine, MD    ADMISSION DIAGNOSIS:  Diff breathing   DISCHARGE DIAGNOSIS:  Acute on chornic copd flare Chronic home oxygen Right ML pneumonia with moderate right sided pleural effusion (thoracentesis to be done as outpt next week) Chronic smoker Chronic afib on pradaxa (on hold now due to planning thoracentesis)  SECONDARY DIAGNOSIS:   Past Medical History  Diagnosis Date  . COPD (chronic obstructive pulmonary disease) ([place])   . Hypertension   . Asthma   . CHF (congestive heart failure) [facility])     HOSPITAL COURSE:  Elaine Decker is a 69 y.o. female presented with shortness of breath. She states that she's been having trouble breathing for the last 3 weeks. Coughing up beige phlegm. Past few days feeling very fatigued  1. Acute hypoxic hypercarbic respiratory failure.  -Patient placed on oxygen supplementation. - consult withr Dr. Devona Konig her pulmonologist noted. Recommends outpt IR to do right sided thoracentesis. Patient is advised to be off Pradaxa until her procedure is completed. -Patient is scheduled to see Leta Baptist PA at Dr. Noberto Retort An office on Monday at 10 AM. He will discuss with IR and get the procedure scheduled.  2. Rapid atrial fibrillation- patient was placed on a Cardizem drip in the ER and will titrate up to 15 mg per hour.  -wean her off CCB drip and cont oral cardizem  - Cardiology consult with dr Ubaldo Glassing noted -Dr. Wanda Plump ATH is okay with patient being off pradaxa till  her effusion is tapped. -On Cardizem 300 by mouth daily  3. COPD exacerbation, right lower lobe pneumonia- - IV Solu-Medrol, budesonide nebulizers, DuoNeb nebulizers and IV Zithromax and Rocephin -Changed to by mouth  steroid taper and by mouth antibiotics as outpatient -Patient advised to use oxygen or daytime  4. History of sleep apnea- will try BiPAP at night with her CO2 retention.  5. Tobacco abuse smoking cessation counseling done 3 minutes by me. Patient refused nicotine patch. She is motivated to quit smoking 6. Essential hypertension continue usual medications  7. History of congestive heart failure- ER physician gave a dose of Lasix but think this is more secondary to COPD exacerbation and pneumonia. -No evidence of heart failure on current admission  Discussed with patient and brother at length agreeable for discharge planning. Discussed with pulmonary and cardiology and okay from their standpoint for patient to be discharged   CONSULTS OBTAINED:  Treatment Team:  Teodoro Spray, MD Allyne Gee, MD Isaias Cowman, MD  DRUG ALLERGIES:  No Known Allergies  DISCHARGE MEDICATIONS:   Current Discharge Medication List    START taking these medications   Details  azithromycin (ZITHROMAX) 250 MG tablet Take as directed Qty: 5 each, Refills: 0    cefUROXime (CEFTIN) 500 MG tablet Take 1 tablet (500 mg total) by mouth 2 (two) times daily with a meal. Qty: 10 tablet, Refills: 0    ipratropium-albuterol (DUONEB) 0.5-2.5 (3) MG/3ML SOLN Take 3 mLs by nebulization every 6 (six) hours. Qty: 360 mL, Refills: 1    predniSONE (DELTASONE) 50 MG tablet Take 50 mg daily then taper by 10 mg daily and then stop Qty: 15 tablet, Refills: 0      CONTINUE these medications which have  CHANGED   Details  diltiazem (CARDIZEM CD) 300 MG 24 hr capsule Take 1 capsule (300 mg total) by mouth daily. Qty: 30 capsule, Refills: 0      CONTINUE these medications which have NOT CHANGED   Details  ADVAIR DISKUS 250-50 MCG/DOSE AEPB Take 2 puffs by mouth 2 (two) times daily. Refills: 5    atorvastatin (LIPITOR) 20 MG tablet Take 20 mg by mouth daily. Refills: 6    cholecalciferol (VITAMIN D)  1000 UNITS tablet Take 1,000 Units by mouth daily.    furosemide (LASIX) 20 MG tablet Take 1 tablet by mouth daily. Refills: 2    lisinopril (PRINIVIL,ZESTRIL) 2.5 MG tablet Take 1 tablet by mouth daily. Refills: 6    montelukast (SINGULAIR) 10 MG tablet Take 1 tablet by mouth daily. Refills: 5    Multiple Vitamin (MULTIVITAMIN) tablet Take 1 tablet by mouth daily.    propranolol (INDERAL) 40 MG tablet Take 1 tablet by mouth 2 (two) times daily. Refills: 3    SPIRIVA RESPIMAT 1.25 MCG/ACT AERS Take 1 puff by mouth daily. Refills: 5    VENTOLIN HFA 108 (90 BASE) MCG/ACT inhaler Take 2 puffs by mouth every 4 (four) hours as needed. Refills: 5      STOP taking these medications     PRADAXA 150 MG CAPS capsule         If you experience worsening of your admission symptoms, develop shortness of breath, life threatening emergency, suicidal or homicidal thoughts you must seek medical attention immediately by calling 911 or calling your MD immediately  if symptoms less severe.  You Must read complete instructions/literature along with all the possible adverse reactions/side effects for all the Medicines you take and that have been prescribed to you. Take any new Medicines after you have completely understood and accept all the possible adverse reactions/side effects.   Please note  You were cared for by a hospitalist during your hospital stay. If you have any questions about your discharge medications or the care you received while you were in the hospital after you are discharged, you can call the unit and asked to speak with the hospitalist on call if the hospitalist that took care of you is not available. Once you are discharged, your primary care physician will handle any further medical issues. Please note that NO REFILLS for any discharge medications will be authorized once you are discharged, as it is imperative that you return to your primary care physician (or establish a  relationship with a primary care physician if you do not have one) for your aftercare needs so that they can reassess your need for medications and monitor your lab values. Today   SUBJECTIVE  Doing well. Some shortness of breath  VITAL SIGNS:  Blood pressure 124/74, pulse 100, temperature 98.1 F (36.7 C), temperature source Oral, resp. rate 16, height 5\' 3"  (1.6 m), weight 176 lb (79.833 kg), SpO2 91 %.  I/O:    Intake/Output Summary (Last 24 hours) at 11/30/15 1308 Last data filed at 11/30/15 0750  Gross per 24 hour  Intake    840 ml  Output    750 ml  Net     90 ml    PHYSICAL EXAMINATION:  GENERAL:  69 y.o.-year-old patient lying in the bed with no acute distress.  EYES: Pupils equal, round, reactive to light and accommodation. No scleral icterus. Extraocular muscles intact.  HEENT: Head atraumatic, normocephalic. Oropharynx and nasopharynx clear.  NECK:  Supple, no jugular venous  distention. No thyroid enlargement, no tenderness.  LUNGS: coarse breath sounds bilaterally, no wheezing, rales,rhonchi or crepitation. No use of accessory muscles of respiration.  CARDIOVASCULAR: S1, S2 normal. No murmurs, rubs, or gallops.  ABDOMEN: Soft, non-tender, non-distended. Bowel sounds present. No organomegaly or mass.  EXTREMITIES: No pedal edema, cyanosis, or clubbing.  NEUROLOGIC: Cranial nerves II through XII are intact. Muscle strength 5/5 in all extremities. Sensation intact. Gait not checked.  PSYCHIATRIC: The patient is alert and oriented x 3.  SKIN: No obvious rash, lesion, or ulcer.   DATA REVIEW:   CBC   Recent Labs Lab 11/29/15 0504  WBC 5.7  HGB 13.7  HCT 42.5  PLT 223    Chemistries   Recent Labs Lab 11/28/15 0923 11/29/15 0504  NA 145 142  K 4.7 5.0  CL 99* 97*  CO2 37* 41*  GLUCOSE 110* 176*  BUN 18 20  CREATININE 0.74 0.83  CALCIUM 9.6 9.0  AST 20  --   ALT 25  --   ALKPHOS 68  --   BILITOT 0.7  --     Microbiology Results   Recent Results  (from the past 240 hour(s))  Blood culture (routine x 2)     Status: None (Preliminary result)   Collection Time: 11/28/15  9:23 AM  Result Value Ref Range Status   Specimen Description BLOOD LEFT ASSIST CONTROL  Final   Special Requests   Final    BOTTLES DRAWN AEROBIC AND ANAEROBIC  5CC AERO 3CC ANERO   Culture NO GROWTH < 24 HOURS  Final   Report Status PENDING  Incomplete  Blood culture (routine x 2)     Status: None (Preliminary result)   Collection Time: 11/28/15  9:29 AM  Result Value Ref Range Status   Specimen Description BLOOD LEFT WRIST  Final   Special Requests   Final    BOTTLES DRAWN AEROBIC AND ANAEROBIC  1CC ANAERO 4CC AERO   Culture NO GROWTH < 24 HOURS  Final   Report Status PENDING  Incomplete  MRSA PCR Screening     Status: None   Collection Time: 11/28/15  2:49 PM  Result Value Ref Range Status   MRSA by PCR NEGATIVE NEGATIVE Final    Comment:        The GeneXpert MRSA Assay (FDA approved for NASAL specimens only), is one component of a comprehensive MRSA colonization surveillance program. It is not intended to diagnose MRSA infection nor to guide or monitor treatment for MRSA infections.     RADIOLOGY:  Ct Chest W Contrast  11/30/2015  CLINICAL DATA:  Admitted to hospital for pneumonia. Complains of cough with some shortness of breath. EXAM: CT CHEST WITH CONTRAST TECHNIQUE: Multidetector CT imaging of the chest was performed during intravenous contrast administration. CONTRAST:  63mL OMNIPAQUE IOHEXOL 300 MG/ML  SOLN COMPARISON:  Chest x-ray 11/28/2015 FINDINGS: Lungs are adequately inflated and demonstrate a moderate size right pleural effusion and small left pleural effusion. There is associated bibasilar consolidation likely compressive atelectasis right worse than left. Mild patchy airspace opacification over the right middle lobe which may be due to infection. Minimal focal subpleural nodularity over the lateral left base. Mild centrilobular  emphysematous disease. Possible web over the right mainstem bronchus. Moderate cardiomegaly. There is calcification over the mitral valve annulus. Pulmonary arterial system is within normal. There is calcified plaque over the thoracic aorta. Mild ectasia of the ascending thoracic aorta measuring 3.7 cm in AP diameter. No significant mediastinal or hilar  adenopathy. No evidence of axillary adenopathy. Remaining mediastinal structures are within normal. Images through the upper abdomen demonstrate a 1.5 cm simple cyst over the upper pole of the right kidney. Bilateral small adrenal adenomas. Possible gallstone. Degenerative change of the spine. IMPRESSION: Moderate size right effusion and small left effusion with associated bibasilar atelectasis. Patchy airspace process over the right middle lobe which may be due to pneumonia. Recommend followup CT 4-6 weeks. Mild emphysematous disease. Moderate cardiomegaly.  Calcification of the mitral valve annulus. Mild ectasia of the ascending thoracic aorta measuring 3.7 cm in AP diameter. Recommend annual imaging followup by CTA or MRA. This recommendation follows 2010 ACCF/AHA/AATS/ACR/ASA/SCA/SCAI/SIR/STS/SVM Guidelines for the Diagnosis and Management of Patients with Thoracic Aortic Disease. Circulation.2010; [id number]. Small bilateral adrenal adenomas. 1.5 cm right renal cyst. Possible gallstone. Electronically Signed   By: Marin Olp M.D.   On: 11/30/2015 10:50     Management plans discussed with the patient, family and they are in agreement.  CODE STATUS:     Code Status Orders        Start     Ordered   11/28/15 1142  Full code   Continuous     11/28/15 1141      TOTAL TIME TAKING CARE OF THIS PATIENT: 40 minutes.    Clementina Mareno M.D on 11/30/2015 at 1:08 PM  Between 7am to 6pm - Pager - [phone] After 6pm go to [email or web address] - password [email or web address] [facility]  Office  [phone]  CC: Primary care physician;  Glendon Axe, MD

## 2015-11-30 NOTE — Progress Notes (Signed)
[facility]  SUBJECTIVE: sob/sfib   Filed Vitals:   11/30/15 0208 11/30/15 0423 11/30/15 0808 11/30/15 0900  BP:  131/79  121/73  Pulse:  101  102  Temp:  98.1 F (36.7 C)  98.4 F (36.9 C)  TempSrc:  Oral  Oral  Resp:  18  16  Height:      Weight:      SpO2: 94% 94% 92% 88%    Intake/Output Summary (Last 24 hours) at 11/30/15 1133 Last data filed at 11/30/15 0750  Gross per 24 hour  Intake    840 ml  Output    750 ml  Net     90 ml    LABS: Basic Metabolic Panel:  Recent Labs  11/28/15 0923 11/29/15 0504  NA 145 142  K 4.7 5.0  CL 99* 97*  CO2 37* 41*  GLUCOSE 110* 176*  BUN 18 20  CREATININE 0.74 0.83  CALCIUM 9.6 9.0   Liver Function Tests:  Recent Labs  11/28/15 0923  AST 20  ALT 25  ALKPHOS 68  BILITOT 0.7  PROT 6.9  ALBUMIN 3.8   No results for input(s): LIPASE, AMYLASE in the last 72 hours. CBC:  Recent Labs  11/28/15 0923 11/29/15 0504  WBC 11.6* 5.7  NEUTROABS 10.2*  --   HGB 14.2 13.7  HCT 45.3 42.5  MCV 96.6 95.3  PLT 337 223   Cardiac Enzymes:  Recent Labs  11/28/15 0923 11/28/15 1442 11/28/15 1802  TROPONINI 0.05* 0.05* 0.04*   BNP: Invalid input(s): POCBNP D-Dimer: No results for input(s): DDIMER in the last 72 hours. Hemoglobin A1C: No results for input(s): HGBA1C in the last 72 hours. Fasting Lipid Panel: No results for input(s): CHOL, HDL, LDLCALC, TRIG, CHOLHDL, LDLDIRECT in the last 72 hours. Thyroid Function Tests: No results for input(s): TSH, T4TOTAL, T3FREE, THYROIDAB in the last 72 hours.  Invalid input(s): FREET3 Anemia Panel: No results for input(s): VITAMINB12, FOLATE, FERRITIN, TIBC, IRON, RETICCTPCT in the last 72 hours.   Physical Exam: Blood pressure 121/73, pulse 102, temperature 98.4 F (36.9 C), temperature source Oral, resp. rate 16, height 5\' 3"  (1.6 m), weight 79.833 kg (176 lb), SpO2 88 %.   General appearance: alert and cooperative Resp:  diminished breath sounds RLL Chest wall: no tenderness Cardio: irregularly irregular rhythm GI: soft, non-tender; bowel sounds normal; no masses,  no organomegaly Neurologic: Grossly normal  TELEMETRY: Reviewed telemetry pt in afib with variable ventricular response:  ASSESSMENT AND PLAN:  Active Problems:   Rapid atrial fibrillation (HCC)-rate is fairly well controlled but still with intermittant rapid vr. Would agree with increasing cardizem to 300 daily. Has large right pleural effusion which will need thoracentesis for therapeutic and diagnostic purposes. She will need to come off of pradaxa for at least 3 days prior to the procedure. She should resume the following day after the procedure. OK for discharge from cardiac standpoint with outpatient follow up.     Teodoro Spray., MD, [facility] 11/30/2015 11:33 AM

## 2015-11-30 NOTE — Progress Notes (Signed)
Pt is a&o, VSS, Aflutter on tele with no complaints of pain or discomfort. Repeat CXR completed. Pt seen by Cardiology and feels ok with going home. Orders to d/c pt to home. IV and tele removed. Pt awaiting brothers arrival with portable home O2 tank. Will continue to assess.

## 2015-12-03 LAB — CULTURE, BLOOD (ROUTINE X 2)
CULTURE: NO GROWTH
Culture: NO GROWTH

## 2015-12-05 ENCOUNTER — Ambulatory Visit
Admission: AD | Admit: 2015-12-05 | Discharge: 2015-12-05 | Disposition: A | Discharge status: Home / Self Care | Payer: BLUE CROSS/BLUE SHIELD | Source: Ambulatory Visit | Attending: Internal Medicine | Admitting: Internal Medicine

## 2015-12-05 ENCOUNTER — Other Ambulatory Visit: Payer: Self-pay | Admitting: Internal Medicine

## 2015-12-05 ENCOUNTER — Ambulatory Visit
Admission: RE | Admit: 2015-12-05 | Discharge: 2015-12-05 | Disposition: A | Discharge status: Home / Self Care | Payer: BLUE CROSS/BLUE SHIELD | Source: Ambulatory Visit | Attending: Internal Medicine | Admitting: Internal Medicine

## 2015-12-05 DIAGNOSIS — J90 Pleural effusion, not elsewhere classified: Secondary | ICD-10-CM | POA: Insufficient documentation

## 2015-12-05 DIAGNOSIS — R918 Other nonspecific abnormal finding of lung field: Secondary | ICD-10-CM | POA: Diagnosis not present

## 2015-12-06 ENCOUNTER — Ambulatory Visit
Admission: RE | Admit: 2015-12-06 | Discharge: 2015-12-06 | Disposition: A | Discharge status: Home / Self Care | Payer: BLUE CROSS/BLUE SHIELD | Source: Ambulatory Visit | Attending: Internal Medicine | Admitting: Internal Medicine

## 2015-12-06 DIAGNOSIS — R911 Solitary pulmonary nodule: Secondary | ICD-10-CM | POA: Diagnosis not present

## 2015-12-06 DIAGNOSIS — J90 Pleural effusion, not elsewhere classified: Secondary | ICD-10-CM | POA: Insufficient documentation

## 2015-12-06 LAB — LACTATE DEHYDROGENASE, PLEURAL OR PERITONEAL FLUID: LD, Fluid: 103 U/L — ABNORMAL HIGH (ref 3–23)

## 2015-12-06 LAB — BODY FLUID CELL COUNT WITH DIFFERENTIAL
EOS FL: 0 %
LYMPHS FL: 89 %
MONOCYTE-MACROPHAGE-SEROUS FLUID: 5 %
Neutrophil Count, Fluid: 6 %
Other Cells, Fluid: 0 %
WBC FLUID: 186 uL

## 2015-12-06 LAB — GLUCOSE, SEROUS FLUID: GLUCOSE FL: 117 mg/dL

## 2015-12-06 LAB — PROTEIN, BODY FLUID

## 2015-12-06 NOTE — Procedures (Signed)
US guided right thoracentesis.  Removed 300 ml of yellow fluid.  No immediate complication.

## 2015-12-07 LAB — CYTOLOGY - NON PAP

## 2015-12-10 LAB — BODY FLUID CULTURE: CULTURE: NO GROWTH

## 2015-12-12 LAB — PH, BODY FLUID: PH, BODY FLUID: 8.6

## 2015-12-21 ENCOUNTER — Other Ambulatory Visit: Payer: Self-pay | Admitting: Physician Assistant

## 2015-12-21 DIAGNOSIS — J189 Pneumonia, unspecified organism: Secondary | ICD-10-CM

## 2015-12-23 ENCOUNTER — Ambulatory Visit
Admission: RE | Admit: 2015-12-23 | Discharge: 2015-12-23 | Disposition: A | Discharge status: Home / Self Care | Payer: BLUE CROSS/BLUE SHIELD | Source: Ambulatory Visit | Attending: Physician Assistant | Admitting: Physician Assistant

## 2015-12-23 DIAGNOSIS — I712 Thoracic aortic aneurysm, without rupture: Secondary | ICD-10-CM | POA: Diagnosis not present

## 2015-12-23 DIAGNOSIS — I2510 Atherosclerotic heart disease of native coronary artery without angina pectoris: Secondary | ICD-10-CM | POA: Insufficient documentation

## 2015-12-23 DIAGNOSIS — J189 Pneumonia, unspecified organism: Secondary | ICD-10-CM | POA: Diagnosis present

## 2015-12-23 MED ORDER — IOHEXOL 350 MG/ML SOLN
75.0000 mL | Freq: Once | INTRAVENOUS | Status: AC | PRN
  Administered 2015-12-23: 75 mL via INTRAVENOUS

## 2016-01-11 ENCOUNTER — Other Ambulatory Visit: Payer: Self-pay | Admitting: Internal Medicine

## 2016-01-11 DIAGNOSIS — R0602 Shortness of breath: Secondary | ICD-10-CM

## 2016-01-12 ENCOUNTER — Ambulatory Visit
Admission: RE | Admit: 2016-01-12 | Discharge: 2016-01-12 | Disposition: A | Discharge status: Home / Self Care | Payer: BLUE CROSS/BLUE SHIELD | Source: Ambulatory Visit | Attending: Internal Medicine | Admitting: Internal Medicine

## 2016-01-12 ENCOUNTER — Other Ambulatory Visit: Payer: Self-pay | Admitting: Physician Assistant

## 2016-01-12 DIAGNOSIS — I517 Cardiomegaly: Secondary | ICD-10-CM | POA: Insufficient documentation

## 2016-01-12 DIAGNOSIS — D381 Neoplasm of uncertain behavior of trachea, bronchus and lung: Secondary | ICD-10-CM

## 2016-01-12 DIAGNOSIS — R918 Other nonspecific abnormal finding of lung field: Secondary | ICD-10-CM | POA: Diagnosis not present

## 2016-01-12 DIAGNOSIS — J90 Pleural effusion, not elsewhere classified: Secondary | ICD-10-CM | POA: Diagnosis not present

## 2016-01-12 DIAGNOSIS — R0602 Shortness of breath: Secondary | ICD-10-CM | POA: Diagnosis present

## 2016-01-18 ENCOUNTER — Ambulatory Visit
Admission: RE | Admit: 2016-01-18 | Discharge: 2016-01-18 | Disposition: A | Discharge status: Home / Self Care | Payer: BLUE CROSS/BLUE SHIELD | Source: Ambulatory Visit | Attending: Physician Assistant | Admitting: Physician Assistant

## 2016-01-18 DIAGNOSIS — J189 Pneumonia, unspecified organism: Secondary | ICD-10-CM | POA: Insufficient documentation

## 2016-01-18 DIAGNOSIS — J90 Pleural effusion, not elsewhere classified: Secondary | ICD-10-CM | POA: Diagnosis not present

## 2016-01-18 DIAGNOSIS — J9819 Other pulmonary collapse: Secondary | ICD-10-CM | POA: Diagnosis not present

## 2016-01-18 DIAGNOSIS — D381 Neoplasm of uncertain behavior of trachea, bronchus and lung: Secondary | ICD-10-CM

## 2016-01-18 DIAGNOSIS — I272 Other secondary pulmonary hypertension: Secondary | ICD-10-CM | POA: Diagnosis not present

## 2016-01-18 DIAGNOSIS — D3501 Benign neoplasm of right adrenal gland: Secondary | ICD-10-CM | POA: Diagnosis not present

## 2016-01-18 DIAGNOSIS — I2510 Atherosclerotic heart disease of native coronary artery without angina pectoris: Secondary | ICD-10-CM | POA: Insufficient documentation

## 2016-01-18 DIAGNOSIS — N200 Calculus of kidney: Secondary | ICD-10-CM | POA: Diagnosis not present

## 2016-01-18 LAB — POCT I-STAT CREATININE: CREATININE: 0.7 mg/dL (ref 0.44–1.00)

## 2016-01-18 MED ORDER — IOHEXOL 300 MG/ML  SOLN
75.0000 mL | Freq: Once | INTRAMUSCULAR | Status: AC | PRN
  Administered 2016-01-18: 75 mL via INTRAVENOUS

## 2016-01-19 ENCOUNTER — Inpatient Hospital Stay
Admission: EM | Admit: 2016-01-19 | Discharge: 2016-01-24 | DRG: 291 | Disposition: A | Discharge status: Home / Self Care | Payer: Medicare Other | Attending: Internal Medicine | Admitting: Internal Medicine

## 2016-01-19 ENCOUNTER — Encounter: Payer: Self-pay | Admitting: Emergency Medicine

## 2016-01-19 DIAGNOSIS — R7303 Prediabetes: Secondary | ICD-10-CM

## 2016-01-19 DIAGNOSIS — J9622 Acute and chronic respiratory failure with hypercapnia: Secondary | ICD-10-CM | POA: Diagnosis present

## 2016-01-19 DIAGNOSIS — Z7901 Long term (current) use of anticoagulants: Secondary | ICD-10-CM

## 2016-01-19 DIAGNOSIS — R0602 Shortness of breath: Secondary | ICD-10-CM | POA: Diagnosis present

## 2016-01-19 DIAGNOSIS — Z9981 Dependence on supplemental oxygen: Secondary | ICD-10-CM | POA: Diagnosis not present

## 2016-01-19 DIAGNOSIS — J45909 Unspecified asthma, uncomplicated: Secondary | ICD-10-CM | POA: Diagnosis present

## 2016-01-19 DIAGNOSIS — J441 Chronic obstructive pulmonary disease with (acute) exacerbation: Secondary | ICD-10-CM | POA: Diagnosis present

## 2016-01-19 DIAGNOSIS — I5023 Acute on chronic systolic (congestive) heart failure: Secondary | ICD-10-CM | POA: Diagnosis present

## 2016-01-19 DIAGNOSIS — Z8249 Family history of ischemic heart disease and other diseases of the circulatory system: Secondary | ICD-10-CM | POA: Diagnosis not present

## 2016-01-19 DIAGNOSIS — I4892 Unspecified atrial flutter: Secondary | ICD-10-CM | POA: Diagnosis present

## 2016-01-19 DIAGNOSIS — Z8701 Personal history of pneumonia (recurrent): Secondary | ICD-10-CM | POA: Diagnosis not present

## 2016-01-19 DIAGNOSIS — R188 Other ascites: Secondary | ICD-10-CM

## 2016-01-19 DIAGNOSIS — Z9889 Other specified postprocedural states: Secondary | ICD-10-CM

## 2016-01-19 DIAGNOSIS — E785 Hyperlipidemia, unspecified: Secondary | ICD-10-CM | POA: Diagnosis present

## 2016-01-19 DIAGNOSIS — Z801 Family history of malignant neoplasm of trachea, bronchus and lung: Secondary | ICD-10-CM

## 2016-01-19 DIAGNOSIS — J90 Pleural effusion, not elsewhere classified: Secondary | ICD-10-CM

## 2016-01-19 DIAGNOSIS — N189 Chronic kidney disease, unspecified: Secondary | ICD-10-CM | POA: Diagnosis present

## 2016-01-19 DIAGNOSIS — J440 Chronic obstructive pulmonary disease with acute lower respiratory infection: Secondary | ICD-10-CM | POA: Diagnosis present

## 2016-01-19 DIAGNOSIS — M7989 Other specified soft tissue disorders: Secondary | ICD-10-CM

## 2016-01-19 DIAGNOSIS — Z79899 Other long term (current) drug therapy: Secondary | ICD-10-CM | POA: Diagnosis not present

## 2016-01-19 DIAGNOSIS — I480 Paroxysmal atrial fibrillation: Secondary | ICD-10-CM | POA: Diagnosis present

## 2016-01-19 DIAGNOSIS — I10 Essential (primary) hypertension: Secondary | ICD-10-CM | POA: Diagnosis present

## 2016-01-19 DIAGNOSIS — J9621 Acute and chronic respiratory failure with hypoxia: Secondary | ICD-10-CM | POA: Diagnosis present

## 2016-01-19 DIAGNOSIS — I130 Hypertensive heart and chronic kidney disease with heart failure and stage 1 through stage 4 chronic kidney disease, or unspecified chronic kidney disease: Secondary | ICD-10-CM | POA: Diagnosis present

## 2016-01-19 DIAGNOSIS — J189 Pneumonia, unspecified organism: Secondary | ICD-10-CM

## 2016-01-19 DIAGNOSIS — Z87891 Personal history of nicotine dependence: Secondary | ICD-10-CM | POA: Diagnosis not present

## 2016-01-19 DIAGNOSIS — R531 Weakness: Secondary | ICD-10-CM

## 2016-01-19 DIAGNOSIS — R609 Edema, unspecified: Secondary | ICD-10-CM

## 2016-01-19 HISTORY — DX: Unspecified atrial fibrillation: I48.91

## 2016-01-19 HISTORY — DX: Hyperlipidemia, unspecified: E78.5

## 2016-01-19 HISTORY — DX: Chronic kidney disease, unspecified: N18.9

## 2016-01-19 LAB — CBC WITH DIFFERENTIAL/PLATELET
BASOS PCT: 1 %
Basophils Absolute: 0 10*3/uL (ref 0–0.1)
EOS ABS: 0.1 10*3/uL (ref 0–0.7)
EOS PCT: 1 %
HCT: 38.8 % (ref 35.0–47.0)
HEMOGLOBIN: 12.2 g/dL (ref 12.0–16.0)
Lymphocytes Relative: 10 %
Lymphs Abs: 1 10*3/uL (ref 1.0–3.6)
MCH: 29.5 pg (ref 26.0–34.0)
MCHC: 31.6 g/dL — AB (ref 32.0–36.0)
MCV: 93.6 fL (ref 80.0–100.0)
Monocytes Absolute: 0.7 10*3/uL (ref 0.2–0.9)
Monocytes Relative: 7 %
NEUTROS PCT: 81 %
Neutro Abs: 8.1 10*3/uL — ABNORMAL HIGH (ref 1.4–6.5)
PLATELETS: 243 10*3/uL (ref 150–440)
RBC: 4.15 MIL/uL (ref 3.80–5.20)
RDW: 17.1 % — ABNORMAL HIGH (ref 11.5–14.5)
WBC: 10 10*3/uL (ref 3.6–11.0)

## 2016-01-19 LAB — BASIC METABOLIC PANEL
Anion gap: 5 (ref 5–15)
BUN: 14 mg/dL (ref 6–20)
CHLORIDE: 99 mmol/L — AB (ref 101–111)
CO2: 37 mmol/L — ABNORMAL HIGH (ref 22–32)
CREATININE: 0.73 mg/dL (ref 0.44–1.00)
Calcium: 9.3 mg/dL (ref 8.9–10.3)
Glucose, Bld: 113 mg/dL — ABNORMAL HIGH (ref 65–99)
POTASSIUM: 4.1 mmol/L (ref 3.5–5.1)
SODIUM: 141 mmol/L (ref 135–145)

## 2016-01-19 LAB — TROPONIN I

## 2016-01-19 LAB — BRAIN NATRIURETIC PEPTIDE: B NATRIURETIC PEPTIDE 5: 1048 pg/mL — AB (ref 0.0–100.0)

## 2016-01-19 MED ORDER — LEVALBUTEROL HCL 0.63 MG/3ML IN NEBU: 0.6300 mg | INHALATION_SOLUTION | Freq: Four times a day (QID) | RESPIRATORY_TRACT | Status: DC | PRN

## 2016-01-19 MED ORDER — SODIUM CHLORIDE 0.9% FLUSH
3.0000 mL | Freq: Two times a day (BID) | INTRAVENOUS | Status: DC
  Administered 2016-01-19 – 2016-01-24 (×10): 3 mL via INTRAVENOUS

## 2016-01-19 MED ORDER — LISINOPRIL 5 MG PO TABS
2.5000 mg | ORAL_TABLET | Freq: Two times a day (BID) | ORAL | Status: DC
  Administered 2016-01-19 – 2016-01-24 (×10): 2.5 mg via ORAL
  Filled 2016-01-19 (×10): qty 1

## 2016-01-19 MED ORDER — FUROSEMIDE 10 MG/ML IJ SOLN
20.0000 mg | Freq: Once | INTRAMUSCULAR | Status: AC
  Administered 2016-01-19: 20 mg via INTRAVENOUS
  Filled 2016-01-19: qty 2

## 2016-01-19 MED ORDER — ACETAMINOPHEN 650 MG RE SUPP: 650.0000 mg | Freq: Four times a day (QID) | RECTAL | Status: DC | PRN

## 2016-01-19 MED ORDER — ONDANSETRON HCL 4 MG/2ML IJ SOLN: 4.0000 mg | Freq: Four times a day (QID) | INTRAMUSCULAR | Status: DC | PRN

## 2016-01-19 MED ORDER — ACETAMINOPHEN 325 MG PO TABS: 650.0000 mg | ORAL_TABLET | Freq: Four times a day (QID) | ORAL | Status: DC | PRN

## 2016-01-19 MED ORDER — MONTELUKAST SODIUM 10 MG PO TABS
10.0000 mg | ORAL_TABLET | Freq: Every day | ORAL | Status: DC
  Administered 2016-01-19 – 2016-01-23 (×5): 10 mg via ORAL
  Filled 2016-01-19 (×5): qty 1

## 2016-01-19 MED ORDER — DABIGATRAN ETEXILATE MESYLATE 75 MG PO CAPS
150.0000 mg | ORAL_CAPSULE | Freq: Two times a day (BID) | ORAL | Status: DC
Start: 2016-01-19 — End: 2016-01-22
  Administered 2016-01-19 – 2016-01-22 (×6): 150 mg via ORAL
  Filled 2016-01-19 (×6): qty 2

## 2016-01-19 MED ORDER — ATORVASTATIN CALCIUM 20 MG PO TABS
20.0000 mg | ORAL_TABLET | Freq: Every day | ORAL | Status: DC
  Administered 2016-01-19 – 2016-01-23 (×5): 20 mg via ORAL
  Filled 2016-01-19 (×5): qty 1

## 2016-01-19 MED ORDER — TIOTROPIUM BROMIDE MONOHYDRATE 18 MCG IN CAPS
18.0000 ug | ORAL_CAPSULE | Freq: Every day | RESPIRATORY_TRACT | Status: DC
  Administered 2016-01-20 – 2016-01-24 (×5): 18 ug via RESPIRATORY_TRACT
  Filled 2016-01-19: qty 5

## 2016-01-19 MED ORDER — PROPRANOLOL HCL 20 MG PO TABS
40.0000 mg | ORAL_TABLET | Freq: Two times a day (BID) | ORAL | Status: DC
  Administered 2016-01-19 – 2016-01-24 (×10): 40 mg via ORAL
  Filled 2016-01-19 (×11): qty 2

## 2016-01-19 MED ORDER — MOMETASONE FURO-FORMOTEROL FUM 100-5 MCG/ACT IN AERO
2.0000 | INHALATION_SPRAY | Freq: Two times a day (BID) | RESPIRATORY_TRACT | Status: DC
  Administered 2016-01-19 – 2016-01-24 (×10): 2 via RESPIRATORY_TRACT
  Filled 2016-01-19: qty 8.8

## 2016-01-19 MED ORDER — ONDANSETRON HCL 4 MG PO TABS: 4.0000 mg | ORAL_TABLET | Freq: Four times a day (QID) | ORAL | Status: DC | PRN

## 2016-01-19 MED ORDER — DILTIAZEM HCL ER COATED BEADS 240 MG PO CP24
240.0000 mg | ORAL_CAPSULE | Freq: Every day | ORAL | Status: DC
  Administered 2016-01-20 – 2016-01-24 (×5): 240 mg via ORAL
  Filled 2016-01-19 (×6): qty 1

## 2016-01-19 NOTE — ED Notes (Signed)
Pt given meal tray at this time, sitting up in bed eating and tolerating well. No acute distress noted.

## 2016-01-19 NOTE — ED Notes (Addendum)
Pt reports shortness of breath for a few days, worse today. Pt states "I've got fluid on my lungs"; pt on portable oxygen (2L). Pt had CT yesterday, were unable to get her admitted due to limited beds in this hospital- Dr Humphrey Rolls ordered.

## 2016-01-19 NOTE — ED Provider Notes (Signed)
[facility] Emergency Department Provider Note   ____________________________________________  Time seen: ~1625  I have reviewed the triage vital signs and the nursing notes.   HISTORY  Chief Complaint Shortness of Breath   History limited by: Not Limited   HPI Elaine Decker is a 70 y.o. female who presents to the emergency department today because of concerns for increasing shortness breath and findings on CT scan. Patient states that she has been dealing with shortness of breath for a number of months. She was placed on home oxygen after her hospitalization at the end of last year. She states she has been using this intermittently. She has been found to have fluid in the lung in the past and states that she did have this fluid drained at one point. The patient states that she has also been put on multiple rounds of antibiotics. She states she has been on 3 rounds of antibiotics and finished the final round 2 days ago. She denies any recent fevers.     Past Medical History  Diagnosis Date  . COPD (chronic obstructive pulmonary disease) ([place])   . Hypertension   . Asthma   . CHF (congestive heart failure) [facility])     Patient Active Problem List   Diagnosis Date Noted  . Rapid atrial fibrillation ([place]) 11/28/2015    Past Surgical History  Procedure Laterality Date  . Surgery on the neck      Current Outpatient Rx  Name  Route  Sig  Dispense  Refill  . ADVAIR DISKUS 250-50 MCG/DOSE AEPB   Oral   Take 2 puffs by mouth 2 (two) times daily.      5     Dispense as written.   Marland Kitchen atorvastatin (LIPITOR) 20 MG tablet   Oral   Take 20 mg by mouth daily.      6   . azithromycin (ZITHROMAX) 250 MG tablet      Take as directed   5 each   0   . cefUROXime (CEFTIN) 500 MG tablet   Oral   Take 1 tablet (500 mg total) by mouth 2 (two) times daily with a meal.   10 tablet   0   . cholecalciferol (VITAMIN D) 1000 UNITS tablet   Oral   Take  1,000 Units by mouth daily.         Marland Kitchen diltiazem (CARDIZEM CD) 300 MG 24 hr capsule   Oral   Take 1 capsule (300 mg total) by mouth daily.   30 capsule   0   . furosemide (LASIX) 20 MG tablet   Oral   Take 1 tablet by mouth daily.      2   . ipratropium-albuterol (DUONEB) 0.5-2.5 (3) MG/3ML SOLN   Nebulization   Take 3 mLs by nebulization every 6 (six) hours.   360 mL   1   . lisinopril (PRINIVIL,ZESTRIL) 2.5 MG tablet   Oral   Take 1 tablet by mouth daily.      6   . montelukast (SINGULAIR) 10 MG tablet   Oral   Take 1 tablet by mouth daily.      5   . Multiple Vitamin (MULTIVITAMIN) tablet   Oral   Take 1 tablet by mouth daily.         . potassium chloride SA (K-DUR,KLOR-CON) 20 MEQ tablet   Oral   Take 20 mEq by mouth daily.         . predniSONE (DELTASONE) 50  MG tablet      Take 50 mg daily then taper by 10 mg daily and then stop   15 tablet   0   . propranolol (INDERAL) 40 MG tablet   Oral   Take 1 tablet by mouth 2 (two) times daily.      3   . SPIRIVA RESPIMAT 1.25 MCG/ACT AERS   Oral   Take 1 puff by mouth daily.      5     Dispense as written.   . VENTOLIN HFA 108 (90 BASE) MCG/ACT inhaler   Oral   Take 2 puffs by mouth every 4 (four) hours as needed.      5     Dispense as written.     Allergies Review of patient's allergies indicates no known allergies.  Family History  Problem Relation Age of Onset  . CAD Mother   . Lung cancer Father     Social History Social History  Substance Use Topics  . Smoking status: Former Smoker -- 1.00 packs/day  . Smokeless tobacco: None  . Alcohol Use: No    Review of Systems  Constitutional: Negative for fever. Cardiovascular: Negative for chest pain. Respiratory: Positive for shortness of breath. Gastrointestinal: Negative for abdominal pain, vomiting and diarrhea. Neurological: Negative for headaches, focal weakness or numbness.  10-point ROS otherwise  negative.  ____________________________________________   PHYSICAL EXAM:  VITAL SIGNS: ED Triage Vitals  Enc Vitals Group     BP 01/19/16 1615 143/63 mmHg     Pulse Rate 01/19/16 1615 84     Resp 01/19/16 1615 20     Temp 01/19/16 1615 98 F (36.7 C)     Temp Source 01/19/16 1615 Oral     SpO2 --      Weight 01/19/16 1615 170 lb (77.111 kg)     Height 01/19/16 1615 5\' 3"  (1.6 m)     Head Cir --      Peak Flow --      Pain Score 01/19/16 1616 0   Constitutional: Alert and oriented. Well appearing and in no distress. Eyes: Conjunctivae are normal. PERRL. Normal extraocular movements. ENT   Head: Normocephalic and atraumatic.   Nose: No congestion/rhinnorhea.   Mouth/Throat: Mucous membranes are moist.   Neck: No stridor. Hematological/Lymphatic/Immunilogical: No cervical lymphadenopathy. Cardiovascular: Normal rate, regular rhythm.  No murmurs, rubs, or gallops. Respiratory: Normal respiratory effort without tachypnea nor retractions. Breath sounds diminished in the right lower lung Gastrointestinal: Soft and nontender. No distention. There is no CVA tenderness. Genitourinary: Deferred Musculoskeletal: Normal range of motion in all extremities. No joint effusions. 1+ bilateral pitting edema Neurologic:  Normal speech and language. No gross focal neurologic deficits are appreciated.  Skin:  Skin is warm, dry and intact. No rash noted. Psychiatric: Mood and affect are normal. Speech and behavior are normal. Patient exhibits appropriate insight and judgment.  ____________________________________________    LABS (pertinent positives/negatives)  Labs Reviewed  CBC WITH DIFFERENTIAL/PLATELET - Abnormal; Notable for the following:    MCHC 31.6 (*)    RDW 17.1 (*)    Neutro Abs 8.1 (*)    All other components within normal limits  BASIC METABOLIC PANEL - Abnormal; Notable for the following:    Chloride 99 (*)    CO2 37 (*)    Glucose, Bld 113 (*)    All other  components within normal limits  TROPONIN I     ____________________________________________   EKG  I, Nance Pear, attending physician, personally  viewed and interpreted this EKG  EKG Time: 1620 Rate: 97 Rhythm: atrial flutter with variable av block Axis: normal Intervals: qtc 490 QRS: narrow, q wave V1 ST changes: no st eleavtion Impression: abnormal ekg   ____________________________________________    RADIOLOGY  None   ____________________________________________   PROCEDURES  Procedure(s) performed: None  Critical Care performed: No  ____________________________________________   INITIAL IMPRESSION / ASSESSMENT AND PLAN / ED COURSE  Pertinent labs & imaging results that were available during my care of the patient were reviewed by me and considered in my medical decision making (see chart for details).  Patient presented to the emergency department today because of concerns for shortness of breath and abnormal CT scan. I reviewed the results of the CT scan does show pleural effusion and collapse of right long. Patient without any fevers or leukocytosis here. Will plan on admission to the hospital service for potential thoracentesis.  ____________________________________________   FINAL CLINICAL IMPRESSION(S) / ED DIAGNOSES  Final diagnoses:  Shortness of breath  Pleural effusion     Nance Pear, MD 01/19/16 Joen Laura

## 2016-01-19 NOTE — H&P (Addendum)
[facility] NAME: [name]    MR#:  [id number]  DATE OF BIRTH:  January 17, 1946  DATE OF ADMISSION:  01/19/2016  PRIMARY CARE PHYSICIAN: Glendon Axe, MD   REQUESTING/REFERRING PHYSICIAN: Archie Balboa, M.D.  CHIEF COMPLAINT:   Chief Complaint  Patient presents with  . Shortness of Breath    HISTORY OF PRESENT ILLNESS:  Elaine Decker  is a 70 y.o. female who presents with increasing shortness of breath. Patient has a known history of COPD, and recently has been having recurrent right-sided pleural effusion. She was treated multiple times with rounds of antibiotics for presumed pneumonia. She states that she had some fluid draining from her right lung in December, and that studies were sent on this but she does not know what the results were what studies were sent for analysis. Her pulmonologist is Dr. Humphrey Rolls.  She comes in today due to worsening of her chronic shortness of breath. She states she is on oxygen at home, but despite this she has been getting more dyspneic.  Initial O2 sats by EMS were in the 60s. Here in the ED lab workup was largely benign. CT chest showed moderate right-sided pleural effusion which is noticeably increased from prior imaging. Suggests some possible left lower lobe infiltrate, the patient denies any strong infectious symptoms. She is also in A. fib/A flutter. Oxygen saturation improved significantly with increase in O2 via nasal cannula. Hospitalists were called for further evaluation and admission.  PAST MEDICAL HISTORY:   Past Medical History  Diagnosis Date  . COPD (chronic obstructive pulmonary disease) ([place])   . Hypertension   . Asthma   . CHF (congestive heart failure) ([place])   . A-fib ([place])   . CKD (chronic kidney disease)   . HLD (hyperlipidemia)     PAST SURGICAL HISTORY:   Past Surgical History  Procedure Laterality Date  . Surgery on the neck      SOCIAL HISTORY:   Social History   Substance Use Topics  . Smoking status: Former Smoker -- 1.00 packs/day  . Smokeless tobacco: Not on file  . Alcohol Use: No    FAMILY HISTORY:   Family History  Problem Relation Age of Onset  . CAD Mother   . Lung cancer Father     DRUG ALLERGIES:  No Known Allergies  MEDICATIONS AT HOME:   Prior to Admission medications   Medication Sig Start Date End Date Taking? Authorizing Provider  acetaminophen (TYLENOL) 325 MG tablet Take 325-650 mg by mouth at bedtime.   Yes Historical Provider, MD  albuterol (PROVENTIL HFA;VENTOLIN HFA) 108 (90 Base) MCG/ACT inhaler Inhale 2 puffs into the lungs every 4 (four) hours as needed for wheezing or shortness of breath.   Yes Historical Provider, MD  atorvastatin (LIPITOR) 20 MG tablet Take 20 mg by mouth at bedtime.    Yes Historical Provider, MD  cholecalciferol (VITAMIN D) 1000 UNITS tablet Take 1,000 Units by mouth daily.   Yes Historical Provider, MD  dabigatran (PRADAXA) 150 MG CAPS capsule Take 150 mg by mouth 2 (two) times daily.   Yes Historical Provider, MD  diltiazem (DILACOR XR) 240 MG 24 hr capsule Take 240 mg by mouth daily.   Yes Historical Provider, MD  Fluticasone-Salmeterol (ADVAIR) 250-50 MCG/DOSE AEPB Inhale 1 puff into the lungs 2 (two) times daily.   Yes Historical Provider, MD  ipratropium-albuterol (DUONEB) 0.5-2.5 (3) MG/3ML SOLN Take 3 mLs by nebulization every 6 (six) hours. Patient taking  differently: Take 3 mLs by nebulization every 6 (six) hours as needed (for shortness of breath).  11/30/15  Yes Fritzi Mandes, MD  lisinopril (PRINIVIL,ZESTRIL) 2.5 MG tablet Take 2.5 mg by mouth 2 (two) times daily.    Yes Historical Provider, MD  montelukast (SINGULAIR) 10 MG tablet Take 10 mg by mouth at bedtime.    Yes Historical Provider, MD  Multiple Vitamin (MULTIVITAMIN) tablet Take 1 tablet by mouth daily.   Yes Historical Provider, MD  propranolol (INDERAL) 40 MG tablet Take 40 mg by mouth 2 (two) times daily.    Yes Historical  Provider, MD  tiotropium (SPIRIVA) 18 MCG inhalation capsule Place 18 mcg into inhaler and inhale daily.   Yes Historical Provider, MD    REVIEW OF SYSTEMS:  Review of Systems  Constitutional: Positive for malaise/fatigue. Negative for fever, chills and weight loss.  HENT: Negative for ear pain, hearing loss and tinnitus.   Eyes: Negative for blurred vision, double vision, pain and redness.  Respiratory: Positive for sputum production and shortness of breath. Negative for cough and hemoptysis.   Cardiovascular: Positive for leg swelling. Negative for chest pain, palpitations and orthopnea.  Gastrointestinal: Negative for nausea, vomiting, abdominal pain, diarrhea and constipation.  Genitourinary: Negative for dysuria, frequency and hematuria.  Musculoskeletal: Negative for back pain, joint pain and neck pain.  Skin:       No acne, rash, or lesions  Neurological: Negative for dizziness, tremors, focal weakness and weakness.  Endo/Heme/Allergies: Negative for polydipsia. Does not bruise/bleed easily.  Psychiatric/Behavioral: Negative for depression. The patient is not nervous/anxious and does not have insomnia.      VITAL SIGNS:   Filed Vitals:   01/19/16 1615 01/19/16 1646 01/19/16 1815  BP: 143/63 140/60 139/85  Pulse: 84 100 35  Temp: 98 F (36.7 C)    TempSrc: Oral    Resp: 20 22 16   Height: 5\' 3"  (1.6 m)    Weight: 77.111 kg (170 lb)    SpO2:  97% 95%   Wt Readings from Last 3 Encounters:  01/19/16 77.111 kg (170 lb)  11/28/15 79.833 kg (176 lb)    PHYSICAL EXAMINATION:  Physical Exam  Vitals reviewed. Constitutional: She is oriented to person, place, and time. She appears well-developed and well-nourished. No distress.  HENT:  Head: Normocephalic and atraumatic.  Mouth/Throat: Oropharynx is clear and moist.  Eyes: Conjunctivae and EOM are normal. Pupils are equal, round, and reactive to light. No scleral icterus.  Neck: Normal range of motion. Neck supple. No JVD  present. No thyromegaly present.  Cardiovascular: Intact distal pulses.  Exam reveals no gallop and no friction rub.   No murmur heard. Irregular rhythm, controlled rate  Respiratory: She is in respiratory distress (Mild). She has no wheezes. She has no rales.  Breath sounds diminished in her mid right lung, and absent in her right lower lobe. Left-sided breath sounds normal  GI: Soft. Bowel sounds are normal. She exhibits no distension. There is no tenderness.  Musculoskeletal: Normal range of motion. She exhibits edema (2+ up to her thighs bilateral lower extremities).  No arthritis, no gout  Lymphadenopathy:    She has no cervical adenopathy.  Neurological: She is alert and oriented to person, place, and time. No cranial nerve deficit.  No dysarthria, no aphasia  Skin: Skin is warm and dry. No rash noted. No erythema.  Psychiatric: She has a normal mood and affect. Her behavior is normal. Judgment and thought content normal.    LABORATORY PANEL:  CBC  Recent Labs Lab 01/19/16 1646  WBC 10.0  HGB 12.2  HCT 38.8  PLT 243   ------------------------------------------------------------------------------------------------------------------  Chemistries   Recent Labs Lab 01/19/16 1646  NA 141  K 4.1  CL 99*  CO2 37*  GLUCOSE 113*  BUN 14  CREATININE 0.73  CALCIUM 9.3   ------------------------------------------------------------------------------------------------------------------  Cardiac Enzymes  Recent Labs Lab 01/19/16 1646  TROPONINI <0.03   ------------------------------------------------------------------------------------------------------------------  RADIOLOGY:  Ct Chest W Contrast  01/18/2016  CLINICAL DATA:  Follow-up abnormal CT chest 12/23/2015, pneumonia on 3 rounds of antibiotics without relief. Productive cough over shortness of breath and congestion. EXAM: CT CHEST WITH CONTRAST TECHNIQUE: Multidetector CT imaging of the chest was performed  during intravenous contrast administration. CONTRAST:  22mL OMNIPAQUE IOHEXOL 300 MG/ML  SOLN COMPARISON:  12/23/2015 and 11/30/2015. FINDINGS: Mediastinum/Nodes: Low internal jugular and mediastinal lymph nodes are not enlarged by CT size criteria. There is right hilar lymphoid tissue. No axillary adenopathy. Ascending aorta measures up to 3.9 cm. Pulmonary arteries and heart are enlarged. Coronary artery calcification. No pericardial effusion. Lungs/Pleura: Moderate right pleural effusion, increased, with trace anterior loculation. Tiny left pleural effusion, new. Increasing collapse/ consolidation in the right middle and right lower lobes. Scattered peribronchovascular nodularity and consolidation, worst in the left lower lobe, as before. Findings are superimposed on mild centrilobular emphysema. Airway is otherwise unremarkable. Upper abdomen: Liver may be slightly decreased in attenuation. Low-attenuation adrenal nodules measure 1.6 cm on the right and 2.2 cm on the left, stable. 2.0 cm low-attenuation lesion in the upper pole right kidney is likely a cyst. Punctate left renal stone. Visualized portions of the spleen, pancreas, stomach and bowel are otherwise grossly unremarkable. No upper abdominal adenopathy. Musculoskeletal: No worrisome lytic or sclerotic lesions. Degenerative changes are seen in the spine. Old left rib fractures. IMPRESSION: 1. Increasing collapse/consolidation in the right middle and right lower lobes with persistent scattered peribronchovascular nodularity and consolidation, worst in the left lower lobe. Findings are indicative of ongoing/progressive pneumonia. Continued follow-up to clearing is recommended. 2. Moderate right pleural effusion, increased. Tiny left pleural effusion, new. 3. Enlarged pulmonary arteries, indicative of pulmonary arterial hypertension. 4. Coronary artery calcification. 5. Liver appears fatty. 6. Right adrenal adenoma.  Probable left adrenal adenoma. 7.  Punctate left renal stone. Electronically Signed   By: Lorin Picket M.D.   On: 01/18/2016 09:59    EKG:   Orders placed or performed during the hospital encounter of 01/19/16  . ED EKG  . ED EKG  . EKG 12-Lead  . EKG 12-Lead    IMPRESSION AND PLAN:  Principal Problem:   Acute on chronic respiratory failure with hypoxia (HCC) - continue O2 via nasal cannula, monitor with continuous pulse ox and telemetry tonight. Treat underlying problems as below. Active Problems:   Paroxysmal atrial fibrillation (HCC) - currently rate controlled, patient is intracranially with Primaxin, we'll continue rate controlling medications and monitor her closely. Concerned that she may be in heart failure as a potential cause of this recurrent effusion. BNP ordered, as well as an echocardiogram. Cardiology consulted.   Recurrent pleural effusion, right - previously has been presumed to be related to recurring pneumonias and infection. However, over the past 6 months patient states that she's been treated 3 times antibiotics for pneumonia. She has a possibility of infectious symptoms at this time, with no fever or chills, normal white blood cell count. We will hold off on antibiotics for now, though I have a low threshold for starting these  if anything changes in her clinical situation. Ordered thoracentesis to drain and evaluate this fluid. Pulmonology consult as well.   COPD exacerbation ([place]) - likely exacerbated by the effusion. Continue home inhalers, and when necessary DuoNeb's here.   Essential hypertension - controlled, continue home meds   HLD (hyperlipidemia) - continue home meds  All the records are reviewed and case discussed with ED provider. Management plans discussed with the patient and/or family.  DVT PROPHYLAXIS: Systemic anticoagulation  GI PROPHYLAXIS: None  ADMISSION STATUS: Inpatient  CODE STATUS: Full Code Status History    Date Active Date Inactive Code Status Order ID Comments User  Context   11/28/2015 11:41 AM 11/30/2015  5:51 PM Full Code [id number]  Loletha Grayer, MD ED      TOTAL TIME TAKING CARE OF THIS PATIENT: 50 minutes.    Metta Koranda FIELDING 01/19/2016, 7:54 PM  [facility]  Office  [phone]  CC: Primary care physician; Glendon Axe, MD

## 2016-01-20 ENCOUNTER — Inpatient Hospital Stay: Payer: Medicare Other

## 2016-01-20 LAB — BODY FLUID CELL COUNT WITH DIFFERENTIAL
EOS FL: 0 %
LYMPHS FL: 75 %
MONOCYTE-MACROPHAGE-SEROUS FLUID: 12 %
NEUTROPHIL FLUID: 13 %
OTHER CELLS FL: 0 %
Total Nucleated Cell Count, Fluid: 2794 cu mm

## 2016-01-20 LAB — CBC
HCT: 38.8 % (ref 35.0–47.0)
Hemoglobin: 12 g/dL (ref 12.0–16.0)
MCH: 30.1 pg (ref 26.0–34.0)
MCHC: 31 g/dL — ABNORMAL LOW (ref 32.0–36.0)
MCV: 97 fL (ref 80.0–100.0)
PLATELETS: 243 10*3/uL (ref 150–440)
RBC: 4 MIL/uL (ref 3.80–5.20)
RDW: 17.8 % — AB (ref 11.5–14.5)
WBC: 8.9 10*3/uL (ref 3.6–11.0)

## 2016-01-20 LAB — BASIC METABOLIC PANEL
Anion gap: 4 — ABNORMAL LOW (ref 5–15)
BUN: 14 mg/dL (ref 6–20)
CHLORIDE: 101 mmol/L (ref 101–111)
CO2: 40 mmol/L — ABNORMAL HIGH (ref 22–32)
CREATININE: 0.77 mg/dL (ref 0.44–1.00)
Calcium: 9.1 mg/dL (ref 8.9–10.3)
Glucose, Bld: 131 mg/dL — ABNORMAL HIGH (ref 65–99)
POTASSIUM: 4.3 mmol/L (ref 3.5–5.1)
SODIUM: 145 mmol/L (ref 135–145)

## 2016-01-20 LAB — LACTATE DEHYDROGENASE, PLEURAL OR PERITONEAL FLUID: LD FL: 60 U/L — AB (ref 3–23)

## 2016-01-20 LAB — GLUCOSE, SEROUS FLUID: Glucose, Fluid: 125 mg/dL

## 2016-01-20 LAB — PROTEIN, BODY FLUID

## 2016-01-20 LAB — PROCALCITONIN: Procalcitonin: <0.1 ng/mL

## 2016-01-20 MED ORDER — FUROSEMIDE 10 MG/ML IJ SOLN
40.0000 mg | Freq: Every day | INTRAMUSCULAR | Status: DC
  Administered 2016-01-20 – 2016-01-24 (×5): 40 mg via INTRAVENOUS
  Filled 2016-01-20 (×5): qty 4

## 2016-01-20 MED ORDER — PREDNISONE 50 MG PO TABS
50.0000 mg | ORAL_TABLET | Freq: Every day | ORAL | Status: DC
  Administered 2016-01-21 – 2016-01-24 (×4): 50 mg via ORAL
  Filled 2016-01-20 (×4): qty 1

## 2016-01-20 NOTE — Care Management (Signed)
It is reported that patient has chronic 02 and need for recurrent thoracentesis.  CM has not assessed

## 2016-01-20 NOTE — Progress Notes (Signed)
Elaine Decker is a 70 y.o. female patient admitted from ED awake, alert - oriented  X 4 - no acute distress noted.  VSS - Blood pressure 145/79, pulse 94, temperature 97.7 F (36.5 C), temperature source Oral, resp. rate 18, height 5\' 3"  (1.6 m), weight 187 lb 12.8 oz (85.186 kg), SpO2 90 %.    IV in place, occlusive dsg intact without redness.  Orientation to room, and floor completed with information packet given to patient/family. Admission INP armband ID verified with patient/family, and in place.   SR up x 2, fall assessment complete, with patient and family able to verbalize understanding of risk associated with falls, and verbalized understanding to call nsg before up out of bed.  Call light within reach, patient able to voice, and demonstrate understanding.  Skin assessed and telemetry box verified with Delrae Rend, RN.   Will cont to eval and treat per MD orders.  Rachael Fee, RN

## 2016-01-20 NOTE — Procedures (Signed)
US guided right thoracentesis.  Removed 1.1 liters of amber colored fluid without complication.  CXR pending.

## 2016-01-20 NOTE — Consult Note (Signed)
[facility] Cardiology  CARDIOLOGY CONSULT NOTE  Patient ID: Elaine Decker MRN: [id number] DOB/AGE: 68/29/1947 68 y.o.  Admit date: 01/19/2016 Referring Physician Hower Primary Physician Candiss Norse Primary Cardiologist Aziel Morgan Reason for Consultation atrial fibrillation  HPI: 70 year old female referred for evaluation of atrial fibrillation, and chronic systolic congestive heart failure. She has known history of paroxysmal atrial fibrillation/atrial flutter, chads Vasc 3, currently on Pradaxa for stroke prevention. Patient has known history of mildly reduced left ventricular function, LVEF of 45%, with mild chronic systolic congestive heart failure. The patient has known COPD, on home oxygen therapy, with history of recurrent right pleural effusion, status post thoracentesis 12/16 with initial clinical improvement. Patient returns with worsening shortness of breath. CT scan reveals moderate right sided pleural effusion and possible left lower lobe infiltrate. The patient denies chest pain, palpitations, heart racing. She does have chronic peripheral edema.  Review of systems complete and found to be negative unless listed above     Past Medical History  Diagnosis Date  . COPD (chronic obstructive pulmonary disease) ([place])   . Hypertension   . Asthma   . CHF (congestive heart failure) ([place])   . A-fib ([place])   . CKD (chronic kidney disease)   . HLD (hyperlipidemia)     Past Surgical History  Procedure Laterality Date  . Surgery on the neck      Prescriptions prior to admission  Medication Sig Dispense Refill Last Dose  . acetaminophen (TYLENOL) 325 MG tablet Take 325-650 mg by mouth at bedtime.   01/18/2016 at pm  . albuterol (PROVENTIL HFA;VENTOLIN HFA) 108 (90 Base) MCG/ACT inhaler Inhale 2 puffs into the lungs every 4 (four) hours as needed for wheezing or shortness of breath.   PRN  . atorvastatin (LIPITOR) 20 MG tablet Take 20 mg by mouth at bedtime.   6 01/18/2016 at pm  . cholecalciferol  (VITAMIN D) 1000 UNITS tablet Take 1,000 Units by mouth daily.   01/19/2016 at am  . dabigatran (PRADAXA) 150 MG CAPS capsule Take 150 mg by mouth 2 (two) times daily.   01/19/2016 at 0700  . diltiazem (DILACOR XR) 240 MG 24 hr capsule Take 240 mg by mouth daily.   01/19/2016 at am  . Fluticasone-Salmeterol (ADVAIR) 250-50 MCG/DOSE AEPB Inhale 1 puff into the lungs 2 (two) times daily.   01/19/2016 at am  . ipratropium-albuterol (DUONEB) 0.5-2.5 (3) MG/3ML SOLN Take 3 mLs by nebulization every 6 (six) hours. (Patient taking differently: Take 3 mLs by nebulization every 6 (six) hours as needed (for shortness of breath). ) 360 mL 1 PRN  . lisinopril (PRINIVIL,ZESTRIL) 2.5 MG tablet Take 2.5 mg by mouth 2 (two) times daily.   6 01/19/2016 at am  . montelukast (SINGULAIR) 10 MG tablet Take 10 mg by mouth at bedtime.   5 01/18/2016 at pm  . Multiple Vitamin (MULTIVITAMIN) tablet Take 1 tablet by mouth daily.   01/19/2016 at am  . propranolol (INDERAL) 40 MG tablet Take 40 mg by mouth 2 (two) times daily.   3 01/19/2016 at 0700  . tiotropium (SPIRIVA) 18 MCG inhalation capsule Place 18 mcg into inhaler and inhale daily.   01/19/2016 at am   Social History   Social History  . Marital Status: Divorced    Spouse Name: N/A  . Number of Children: N/A  . Years of Education: N/A   Occupational History  . Not on file.   Social History Main Topics  . Smoking status: Former Smoker -- 1.00 packs/day  Quit date: 11/27/2015  . Smokeless tobacco: Not on file  . Alcohol Use: No  . Drug Use: No  . Sexual Activity: Not on file   Other Topics Concern  . Not on file   Social History Narrative    Family History  Problem Relation Age of Onset  . CAD Mother   . Lung cancer Father       Review of systems complete and found to be negative unless listed above      PHYSICAL EXAM  General: Well developed, well nourished, in no acute distress HEENT:  Normocephalic and atramatic Neck:  No JVD.  Lungs: Clear  bilaterally to auscultation and percussion. Heart: HRRR . Normal S1 and S2 without gallops or murmurs.  Abdomen: Bowel sounds are positive, abdomen soft and non-tender  Msk:  Back normal, normal gait. Normal strength and tone for age. Extremities: No clubbing, cyanosis or edema.   Neuro: Alert and oriented X 3. Psych:  Good affect, responds appropriately  Labs:   Lab Results  Component Value Date   WBC 8.9 01/20/2016   HGB 12.0 01/20/2016   HCT 38.8 01/20/2016   MCV 97.0 01/20/2016   PLT 243 01/20/2016    Recent Labs Lab 01/19/16 1646  NA 141  K 4.1  CL 99*  CO2 37*  BUN 14  CREATININE 0.73  CALCIUM 9.3  GLUCOSE 113*   Lab Results  Component Value Date   TROPONINI <0.03 01/19/2016   No results found for: CHOL No results found for: HDL No results found for: LDLCALC No results found for: TRIG No results found for: CHOLHDL No results found for: LDLDIRECT    Radiology: Dg Chest 2 View  01/12/2016  CLINICAL DATA:  Shortness of breath. EXAM: CHEST  2 VIEW COMPARISON:  CT 12/23/2015. Chest x-ray 12/06/2015, 12/05/2015, 01/07/2011. FINDINGS: Mediastinum and hilar structures are stable. Cardiomegaly with mild pulmonary vascular prominence and interstitial prominence with right pleural effusion. Findings suggest mild congestive heart failure. Low lung volumes with basilar atelectasis . Old left rib fractures. IMPRESSION: 1. Cardiomegaly with pulmonary vascular prominence interstitial prominence with small right pleural effusions suggesting congestive heart failure. 2. Low lung volumes with basilar atelectasis and/or mild infiltrates, right side greater than left. Electronically Signed   By: Marcello Moores  Register   On: 01/12/2016 10:07   Ct Chest W Contrast  01/18/2016  CLINICAL DATA:  Follow-up abnormal CT chest 12/23/2015, pneumonia on 3 rounds of antibiotics without relief. Productive cough over shortness of breath and congestion. EXAM: CT CHEST WITH CONTRAST TECHNIQUE: Multidetector CT  imaging of the chest was performed during intravenous contrast administration. CONTRAST:  37mL OMNIPAQUE IOHEXOL 300 MG/ML  SOLN COMPARISON:  12/23/2015 and 11/30/2015. FINDINGS: Mediastinum/Nodes: Low internal jugular and mediastinal lymph nodes are not enlarged by CT size criteria. There is right hilar lymphoid tissue. No axillary adenopathy. Ascending aorta measures up to 3.9 cm. Pulmonary arteries and heart are enlarged. Coronary artery calcification. No pericardial effusion. Lungs/Pleura: Moderate right pleural effusion, increased, with trace anterior loculation. Tiny left pleural effusion, new. Increasing collapse/ consolidation in the right middle and right lower lobes. Scattered peribronchovascular nodularity and consolidation, worst in the left lower lobe, as before. Findings are superimposed on mild centrilobular emphysema. Airway is otherwise unremarkable. Upper abdomen: Liver may be slightly decreased in attenuation. Low-attenuation adrenal nodules measure 1.6 cm on the right and 2.2 cm on the left, stable. 2.0 cm low-attenuation lesion in the upper pole right kidney is likely a cyst. Punctate left renal stone. Visualized  portions of the spleen, pancreas, stomach and bowel are otherwise grossly unremarkable. No upper abdominal adenopathy. Musculoskeletal: No worrisome lytic or sclerotic lesions. Degenerative changes are seen in the spine. Old left rib fractures. IMPRESSION: 1. Increasing collapse/consolidation in the right middle and right lower lobes with persistent scattered peribronchovascular nodularity and consolidation, worst in the left lower lobe. Findings are indicative of ongoing/progressive pneumonia. Continued follow-up to clearing is recommended. 2. Moderate right pleural effusion, increased. Tiny left pleural effusion, new. 3. Enlarged pulmonary arteries, indicative of pulmonary arterial hypertension. 4. Coronary artery calcification. 5. Liver appears fatty. 6. Right adrenal adenoma.   Probable left adrenal adenoma. 7. Punctate left renal stone. Electronically Signed   By: Lorin Picket M.D.   On: 01/18/2016 09:59   Ct Chest W Contrast  12/23/2015  CLINICAL DATA:  Pneumonia. EXAM: CT CHEST WITH CONTRAST TECHNIQUE: Multidetector CT imaging of the chest was performed during intravenous contrast administration. CONTRAST:  92mL OMNIPAQUE IOHEXOL 350 MG/ML SOLN COMPARISON:  CT scan of November 30, 2015. FINDINGS: 4 cm ascending thoracic aortic aneurysm is noted. There is no evidence of thoracic aortic dissection. No pneumothorax is noted. Minimal right pleural effusion is noted which is significantly improved compared to prior exam. No pleural effusion is noted on the left. Mild emphysematous changes noted in the upper lobes bilaterally. There are new multiple ill-defined nodular airspace opacities in left lung base most consistent with multi focal pneumonia. Right posterior basilar opacity noted on prior exam is significantly improved, although residual opacity remains consistent with atelectasis or pneumonia. New large area of consolidation is noted anteriorly in right upper lobe most consistent with pneumonia. Stable mildly enlarged mediastinal adenopathy is noted. This most likely is inflammatory in origin. Mild coronary artery calcifications are noted. Visualized portion of upper abdomen demonstrates stable bilateral adrenal nodules. Old left rib fractures are noted. IMPRESSION: 4 cm ascending thoracic aortic aneurysm without evidence of dissection. Recommend annual imaging followup by CTA or MRA. This recommendation follows 2010 ACCF/AHA/AATS/ACR/ASA/SCA/SCAI/SIR/STS/SVM Guidelines for the Diagnosis and Management of Patients with Thoracic Aortic Disease. Circulation. 2010; [id number]. Mild coronary artery calcifications are noted suggesting coronary artery disease. Stable bilateral adrenal nodules are noted. Mild emphysematous changes noted in the upper lobes bilaterally. Minimal right  pleural effusion is noted which is significantly improved compared to prior exam. New multiple nodular airspace opacities are noted in the left lung base, as well as new large areas of consolidation seen anteriorly in right upper lobe, most consistent with multifocal pneumonia. Followup CT scan in 3-4 weeks after antibiotic administration is recommended to ensure resolution and rule out underlying neoplasm or malignancy. Electronically Signed   By: Marijo Conception, M.D.   On: 12/23/2015 13:09    EKG: Atrial fibrillation/atrial flutter at a rate of 95 bpm  ASSESSMENT AND PLAN:   1. Paroxysmal H fibrillation/atrial flutter, rate currently controlled, chads Vascor 3, on Pradaxa for stroke prevention 2. Known history of mildly reduced left ventricular function, LVEF 45%, with chronic systolic congestive heart failure, with mild peripheral edema, without orthopnea or PND 3. Known COPD, history of recurrent pneumonia, recurrent right-sided pleural effusion, temporary improvement after recent thoracentesis. Do not suspect right pleural effusion is primarily due to congestive heart failure.  Recommendations  1. Continue current medications 2. Review 2-D echocardiogram 3. Low-dose maintenance furosemide for peripheral edema  Signed: Jawan Chavarria MD,PhD, [facility] 01/20/2016, 8:20 AM

## 2016-01-20 NOTE — Progress Notes (Signed)
[place] [facility] [place] NAME: Elaine Decker    MR#:  [id number]  DATE OF BIRTH:  01/05/1946  SUBJECTIVE:  Patient admitted to the hospital2/2/17 with shortness of breath and she she's been dealing with intermittently for many months. She also complains of having some lower extremity edema, nonproductive cough denies chest pain fevers chills No acute issues overnight, still complains of shortness of breath mainly with activity still complains of nonproductive cough  REVIEW OF SYSTEMS:  CONSTITUTIONAL: No fever, positive fatigue or weakness.  EYES: No blurred or double vision.  EARS, NOSE, AND THROAT: No tinnitus or ear pain.  RESPIRATORY: Positive cough, shortness of breath, denies wheezing or hemoptysis.  CARDIOVASCULAR: No chest pain, orthopnea, positive edema.  GASTROINTESTINAL: No nausea, vomiting, diarrhea or abdominal pain.  GENITOURINARY: No dysuria, hematuria.  ENDOCRINE: No polyuria, nocturia,  HEMATOLOGY: No anemia, easy bruising or bleeding SKIN: No rash or lesion. MUSCULOSKELETAL: No joint pain or arthritis.   NEUROLOGIC: No tingling, numbness, weakness.  PSYCHIATRY: No anxiety or depression.   DRUG ALLERGIES:  No Known Allergies  VITALS:  Blood pressure 129/78, pulse 73, temperature 97.7 F (36.5 C), temperature source Oral, resp. rate 18, height 5\' 3"  (1.6 m), weight 85.276 kg (188 lb), SpO2 95 %.  PHYSICAL EXAMINATION:  VITAL SIGNS: Filed Vitals:   01/20/16 0532 01/20/16 0900  BP: 115/60 129/78  Pulse: 63 73  Temp:    Resp: 18 18   GENERAL:69 y.o.female currently in no acute distress.  HEAD: Normocephalic, atraumatic.  EYES: Pupils equal, round, reactive to light. Extraocular muscles intact. No scleral icterus.  MOUTH: Moist mucosal membrane. Dentition intact. No abscess noted.  EAR, NOSE, THROAT: Clear without exudates. No external lesions.  NECK: Supple. No thyromegaly. No nodules. No JVD.  PULMONARY:  Diminished breath sounds in all lung fields secondary to poor respiratory effort without wheeze rails or rhonci. No use of accessory muscles, poor respiratory effort. Poor air entry bilaterally CHEST: Nontender to palpation.  CARDIOVASCULAR: S1 and S2. Regular rate and rhythm. No murmurs, rubs, or gallops. 2+ edema. Pedal pulses 2+ bilaterally.  GASTROINTESTINAL: Soft, nontender, nondistended. No masses. Positive bowel sounds. No hepatosplenomegaly.  MUSCULOSKELETAL: No swelling, clubbing, or edema. Range of motion full in all extremities.  NEUROLOGIC: Cranial nerves II through XII are intact. No gross focal neurological deficits. Sensation intact. Reflexes intact.  SKIN: No ulceration, lesions, rashes, or cyanosis. Skin warm and dry. Turgor intact.  PSYCHIATRIC: Mood, affect within normal limits. The patient is awake, alert and oriented x 3. Insight, judgment intact.      LABORATORY PANEL:   CBC  Recent Labs Lab 01/20/16 0534  WBC 8.9  HGB 12.0  HCT 38.8  PLT 243   ------------------------------------------------------------------------------------------------------------------  Chemistries   Recent Labs Lab 01/20/16 0534  NA 145  K 4.3  CL 101  CO2 40*  GLUCOSE 131*  BUN 14  CREATININE 0.77  CALCIUM 9.1   ------------------------------------------------------------------------------------------------------------------  Cardiac Enzymes  Recent Labs Lab 01/19/16 1646  TROPONINI <0.03   ------------------------------------------------------------------------------------------------------------------  RADIOLOGY:  No results found.  EKG:   Orders placed or performed during the hospital encounter of 01/19/16  . ED EKG  . ED EKG  . EKG 12-Lead  . EKG 12-Lead    ASSESSMENT AND PLAN:  70 year old Caucasian female history of COPD, systolic congestive heart failure, former tobacco use presenting with shortness of breath admitted to the hospital 01/19/16  1.  Acute on chronic respiratory failure with hypoxia: Likely  multifactorial concern for worsening pleural effusion, pneumonia, COPD exacerbation, systolic congestive heart failure. She has radiographic evidence but no real signs or symptoms of infection at this point in time including fevers, chills, leukocytosis-will add procalcitonin time to help sort out infectious etiology Patient scheduled for thoracentesis Cardiology input appreciated, we'll continue diuresis follow urine output renal function Actually, DuoNeb, steroids  2. Paroxysmal atrial fibrillation: Pradaxa, rate controlled 3. Essential hypertension: Lisinopril 4. Hyperlipidemia unspecified statin therapy 5. Venous thromboembolism prophylactic: Therapeutic pradaxa     All the records are reviewed and case discussed with Care Management/Social Workerr. Management plans discussed with the patient, family and they are in agreement.  CODE STATUS: Full  TOTAL TIME TAKING CARE OF THIS PATIENT: 40 minutes.   POSSIBLE D/C IN 2-3 DAYS, DEPENDING ON CLINICAL CONDITION.   Lilla Callejo,  Karenann Cai.D on 01/20/2016 at 10:27 AM  Between 7am to 6pm - Pager - [phone]  After 6pm: House Pager: - [phone]  [facility]  Office  [phone]  CC: Primary care physician; Glendon Axe, MD

## 2016-01-21 ENCOUNTER — Inpatient Hospital Stay
Admit: 2016-01-21 | Discharge: 2016-01-21 | Disposition: A | Discharge status: Home / Self Care | Payer: Medicare Other | Attending: Internal Medicine | Admitting: Internal Medicine

## 2016-01-21 ENCOUNTER — Encounter: Payer: Self-pay | Admitting: Radiology

## 2016-01-21 ENCOUNTER — Inpatient Hospital Stay: Payer: Medicare Other

## 2016-01-21 LAB — HEMOGLOBIN A1C: Hgb A1c MFr Bld: 6.3 % — ABNORMAL HIGH (ref 4.0–6.0)

## 2016-01-21 LAB — GLUCOSE, CAPILLARY
GLUCOSE-CAPILLARY: 168 mg/dL — AB (ref 65–99)
Glucose-Capillary: 219 mg/dL — ABNORMAL HIGH (ref 65–99)

## 2016-01-21 MED ORDER — LEVALBUTEROL HCL 0.63 MG/3ML IN NEBU
0.6300 mg | INHALATION_SOLUTION | RESPIRATORY_TRACT | Status: DC
  Administered 2016-01-21 – 2016-01-23 (×10): 0.63 mg via RESPIRATORY_TRACT
  Filled 2016-01-21 (×11): qty 3

## 2016-01-21 MED ORDER — INSULIN ASPART 100 UNIT/ML ~~LOC~~ SOLN
0.0000 [IU] | Freq: Three times a day (TID) | SUBCUTANEOUS | Status: DC
  Administered 2016-01-21: 3 [IU] via SUBCUTANEOUS
  Administered 2016-01-22: 1 [IU] via SUBCUTANEOUS
  Administered 2016-01-22 – 2016-01-23 (×2): 2 [IU] via SUBCUTANEOUS
  Filled 2016-01-21 (×2): qty 2
  Filled 2016-01-21: qty 3
  Filled 2016-01-21: qty 1

## 2016-01-21 MED ORDER — IOHEXOL 300 MG/ML  SOLN
75.0000 mL | Freq: Once | INTRAMUSCULAR | Status: AC | PRN
  Administered 2016-01-21: 75 mL via INTRAVENOUS

## 2016-01-21 NOTE — Consult Note (Signed)
Pulmonary Critical Care  Initial Consult Note   Elaine Decker [id number] DOB: [birth year] DOA: 01/19/2016  Referring physician: Diannia Ruder, MD PCP: Glendon Axe, MD   Chief Complaint: Pleural effusion  HPI: Elaine Decker is a 70 y.o. female with history of COPD and pleural effusion presents with worsening SOB. She recently underwent a thoracentesis which was unremarkable and now had a repeat scan done which shows increased fluid. She has had increased SOB noted. She has a chronic cough noted. No chest pain ntoed. She has no fevers noted. Patient is a smoker and she has COPD also which she takes inhalers for. A thoracentesis has been done and the fluid is more c/w transudate than inflammatory. Cytology is pending.   Review of Systems:  Constitutional:  No weight loss, night sweats, Fevers, chills, fatigue.  HEENT:  No headaches, nasal congestion, post nasal drip,  Cardio-vascular:  No chest pain, Orthopnea, PND, dizziness, palpitations  GI:  No heartburn, indigestion, abdominal pain, nausea, vomiting, diarrhea  Resp:  +shortness of breath +productive cough, No coughing up of blood Skin:  no rash or lesions.  Musculoskeletal:  No joint pain or swelling.   Remainder ROS performed and is unremarkable other than noted in HPI  Past Medical History  Diagnosis Date  . COPD (chronic obstructive pulmonary disease) ([place])   . Hypertension   . Asthma   . CHF (congestive heart failure) ([place])   . A-fib ([place])   . CKD (chronic kidney disease)   . HLD (hyperlipidemia)    Past Surgical History  Procedure Laterality Date  . Surgery on the neck     Social History:  reports that she quit smoking about 7 weeks ago. She does not have any smokeless tobacco history on file. She reports that she does not drink alcohol or use illicit drugs.  No Known Allergies  Family History  Problem Relation Age of Onset  . CAD Mother   . Lung cancer Father     Prior to Admission medications    Medication Sig Start Date End Date Taking? Authorizing Provider  acetaminophen (TYLENOL) 325 MG tablet Take 325-650 mg by mouth at bedtime.   Yes Historical Provider, MD  albuterol (PROVENTIL HFA;VENTOLIN HFA) 108 (90 Base) MCG/ACT inhaler Inhale 2 puffs into the lungs every 4 (four) hours as needed for wheezing or shortness of breath.   Yes Historical Provider, MD  atorvastatin (LIPITOR) 20 MG tablet Take 20 mg by mouth at bedtime.    Yes Historical Provider, MD  cholecalciferol (VITAMIN D) 1000 UNITS tablet Take 1,000 Units by mouth daily.   Yes Historical Provider, MD  dabigatran (PRADAXA) 150 MG CAPS capsule Take 150 mg by mouth 2 (two) times daily.   Yes Historical Provider, MD  diltiazem (DILACOR XR) 240 MG 24 hr capsule Take 240 mg by mouth daily.   Yes Historical Provider, MD  Fluticasone-Salmeterol (ADVAIR) 250-50 MCG/DOSE AEPB Inhale 1 puff into the lungs 2 (two) times daily.   Yes Historical Provider, MD  ipratropium-albuterol (DUONEB) 0.5-2.5 (3) MG/3ML SOLN Take 3 mLs by nebulization every 6 (six) hours. Patient taking differently: Take 3 mLs by nebulization every 6 (six) hours as needed (for shortness of breath).  11/30/15  Yes Fritzi Mandes, MD  lisinopril (PRINIVIL,ZESTRIL) 2.5 MG tablet Take 2.5 mg by mouth 2 (two) times daily.    Yes Historical Provider, MD  montelukast (SINGULAIR) 10 MG tablet Take 10 mg by mouth at bedtime.    Yes Historical Provider, MD  Multiple Vitamin (MULTIVITAMIN)  tablet Take 1 tablet by mouth daily.   Yes Historical Provider, MD  propranolol (INDERAL) 40 MG tablet Take 40 mg by mouth 2 (two) times daily.    Yes Historical Provider, MD  tiotropium (SPIRIVA) 18 MCG inhalation capsule Place 18 mcg into inhaler and inhale daily.   Yes Historical Provider, MD   Physical Exam: Filed Vitals:   01/20/16 1944 01/21/16 0515 01/21/16 0603 01/21/16 1111  BP: 117/72 113/80  121/77  Pulse: 73 67  76  Temp: 97.6 F (36.4 C) 98.1 F (36.7 C)  98.5 F (36.9 C)    TempSrc: Oral Oral  Oral  Resp: 18 20  21   Height:      Weight:   84.006 kg (185 lb 3.2 oz)   SpO2: 96% 96%  95%    Wt Readings from Last 3 Encounters:  01/21/16 84.006 kg (185 lb 3.2 oz)  11/28/15 79.833 kg (176 lb)    General:  Appears calm and comfortable Eyes: PERRL, normal lids, irises & conjunctiva ENT: grossly normal hearing, lips & tongue Neck: no LAD, masses or thyromegaly Cardiovascular: IRR, no m/r/g. No LE edema. Respiratory: no w/r/r. Normal respiratory effort. Abdomen: soft, nontender Skin: no rash or induration seen on limited exam Musculoskeletal: grossly normal tone BUE/BLE Psychiatric: grossly normal mood and affect Neurologic: grossly non-focal.          Labs on Admission:  Basic Metabolic Panel:  Recent Labs Lab 01/18/16 0917 01/19/16 1646 01/20/16 0534  NA  --  141 145  K  --  4.1 4.3  CL  --  99* 101  CO2  --  37* 40*  GLUCOSE  --  113* 131*  BUN  --  14 14  CREATININE 0.70 0.73 0.77  CALCIUM  --  9.3 9.1   Liver Function Tests: No results for input(s): AST, ALT, ALKPHOS, BILITOT, PROT, ALBUMIN in the last 168 hours. No results for input(s): LIPASE, AMYLASE in the last 168 hours. No results for input(s): AMMONIA in the last 168 hours. CBC:  Recent Labs Lab 01/19/16 1646 01/20/16 0534  WBC 10.0 8.9  NEUTROABS 8.1*  --   HGB 12.2 12.0  HCT 38.8 38.8  MCV 93.6 97.0  PLT 243 243   Cardiac Enzymes:  Recent Labs Lab 01/19/16 1646  TROPONINI <0.03    BNP (last 3 results)  Recent Labs  01/19/16 1646  BNP 1048.0*    ProBNP (last 3 results) No results for input(s): PROBNP in the last 8760 hours.  CBG: No results for input(s): GLUCAP in the last 168 hours.  Radiological Exams on Admission: Dg Chest 1 View  01/20/2016  CLINICAL DATA:  Status post 1.1 L right thoracentesis. EXAM: CHEST 1 VIEW COMPARISON:  01/20/2016, 01/18/2016, 01/12/2016 FINDINGS: Significant reduction in the right effusion following right thoracentesis.  No pneumothorax. Trace bilateral pleural effusions persist with bibasilar streaky atelectasis/ airspace disease. Heart is enlarged. Mild central vascular congestion. Remote bilateral rib fractures. Trachea is midline. Atherosclerosis of aorta. IMPRESSION: Negative for pneumothorax following right thoracentesis. Electronically Signed   By: Jerilynn Mages.  Shick M.D.   On: 01/20/2016 10:56   US Thoracentesis Asp Pleural Space W/img Guide  01/20/2016  INDICATION: Recurrent right pleural effusion and difficulty breathing. EXAM: ULTRASOUND GUIDED RIGHT THORACENTESIS MEDICATIONS: None. COMPLICATIONS: None immediate. PROCEDURE: An ultrasound guided thoracentesis was thoroughly discussed with the patient and questions answered. The benefits, risks, alternatives and complications were also discussed. The patient understands and wishes to proceed with the procedure. Written consent was obtained.  Ultrasound was performed to localize and mark an adequate pocket of fluid in the right posterior chest. The area was then prepped and draped in the normal sterile fashion. 1% Lidocaine was used for local anesthesia. Under ultrasound guidance a Safe-T-Centesis catheter catheter was introduced. Thoracentesis was performed. The catheter was removed and a dressing applied. FINDINGS: A total of approximately 1.1 L of amber colored fluid was removed. Samples were sent to the laboratory as requested by the clinical team. IMPRESSION: Successful ultrasound guided right thoracentesis yielding 1.1 L of pleural fluid. Electronically Signed   By: Markus Daft M.D.   On: 01/20/2016 12:37     Above xray post thoracentesis   EKG: Independently reviewed.  Assessment/Plan Principal Problem:   Acute on chronic respiratory failure with hypoxia (HCC) Active Problems:   Paroxysmal atrial fibrillation (HCC)   COPD exacerbation (HCC)   Essential hypertension   Recurrent right pleural effusion   HLD (hyperlipidemia)   1. Recurring Pleural  Effusion -fluid is not particularly inflammatory with the labs more c/w being a transudate with low protein and high glucose. als there is not an increase in neutrophils -I would suggest getting a repeat CT scan of the chest and reassessing the underlying lung -in addition we could consider doing a bronchoscopy to assess if there is any evidence of a mass causing obstruction and collapse after cytology results are known -I am not entirely convinced that the fluid is due to infection at this time  2. COPD with exacerbation -as you are treating continue with inhalers  3. Chronic Respiratory failure -on oxygen will need to assess prior to discharge     I have personally obtained a history, examined the patient, evaluated laboratory and imaging results, formulated the assessment and plan and placed orders.  The Patient requires high complexity decision making for assessment and support.    Allyne Gee, MD [facility] Pulmonary Critical Care Medicine Sleep Medicine

## 2016-01-21 NOTE — Progress Notes (Signed)
Informed MD [place] pt's CBG 219, she will order SSI novolog

## 2016-01-21 NOTE — Progress Notes (Signed)
*  PRELIMINARY RESULTS* Echocardiogram 2D Echocardiogram has been performed.  Elaine Decker 01/21/2016, 3:24 PM

## 2016-01-21 NOTE — Progress Notes (Signed)
[facility] Cardiology  SUBJECTIVE: I feel better   Filed Vitals:   01/20/16 1231 01/20/16 1944 01/21/16 0515 01/21/16 0603  BP:  117/72 113/80   Pulse: 83 73 67   Temp:  97.6 F (36.4 C) 98.1 F (36.7 C)   TempSrc:  Oral Oral   Resp:  18 20   Height:      Weight:    84.006 kg (185 lb 3.2 oz)  SpO2:  96% 96%      Intake/Output Summary (Last 24 hours) at 01/21/16 1035 Last data filed at 01/21/16 1009  Gross per 24 hour  Intake    720 ml  Output   1800 ml  Net  -1080 ml      PHYSICAL EXAM  General: Well developed, well nourished, in no acute distress HEENT:  Normocephalic and atramatic Neck:  No JVD.  Lungs: Clear bilaterally to auscultation and percussion. Heart: HRRR . Normal S1 and S2 without gallops or murmurs.  Abdomen: Bowel sounds are positive, abdomen soft and non-tender  Msk:  Back normal, normal gait. Normal strength and tone for age. Extremities: No clubbing, cyanosis or edema.   Neuro: Alert and oriented X 3. Psych:  Good affect, responds appropriately   LABS: Basic Metabolic Panel:  Recent Labs  01/19/16 1646 01/20/16 0534  NA 141 145  K 4.1 4.3  CL 99* 101  CO2 37* 40*  GLUCOSE 113* 131*  BUN 14 14  CREATININE 0.73 0.77  CALCIUM 9.3 9.1   Liver Function Tests: No results for input(s): AST, ALT, ALKPHOS, BILITOT, PROT, ALBUMIN in the last 72 hours. No results for input(s): LIPASE, AMYLASE in the last 72 hours. CBC:  Recent Labs  01/19/16 1646 01/20/16 0534  WBC 10.0 8.9  NEUTROABS 8.1*  --   HGB 12.2 12.0  HCT 38.8 38.8  MCV 93.6 97.0  PLT 243 243   Cardiac Enzymes:  Recent Labs  01/19/16 1646  TROPONINI <0.03   BNP: Invalid input(s): POCBNP D-Dimer: No results for input(s): DDIMER in the last 72 hours. Hemoglobin A1C: No results for input(s): HGBA1C in the last 72 hours. Fasting Lipid Panel: No results for input(s): CHOL, HDL, LDLCALC, TRIG, CHOLHDL, LDLDIRECT in the last 72 hours. Thyroid Function Tests: No results for  input(s): TSH, T4TOTAL, T3FREE, THYROIDAB in the last 72 hours.  Invalid input(s): FREET3 Anemia Panel: No results for input(s): VITAMINB12, FOLATE, FERRITIN, TIBC, IRON, RETICCTPCT in the last 72 hours.  Dg Chest 1 View  01/20/2016  CLINICAL DATA:  Status post 1.1 L right thoracentesis. EXAM: CHEST 1 VIEW COMPARISON:  01/20/2016, 01/18/2016, 01/12/2016 FINDINGS: Significant reduction in the right effusion following right thoracentesis. No pneumothorax. Trace bilateral pleural effusions persist with bibasilar streaky atelectasis/ airspace disease. Heart is enlarged. Mild central vascular congestion. Remote bilateral rib fractures. Trachea is midline. Atherosclerosis of aorta. IMPRESSION: Negative for pneumothorax following right thoracentesis. Electronically Signed   By: Jerilynn Mages.  Shick M.D.   On: 01/20/2016 10:56   US Thoracentesis Asp Pleural Space W/img Guide  01/20/2016  INDICATION: Recurrent right pleural effusion and difficulty breathing. EXAM: ULTRASOUND GUIDED RIGHT THORACENTESIS MEDICATIONS: None. COMPLICATIONS: None immediate. PROCEDURE: An ultrasound guided thoracentesis was thoroughly discussed with the patient and questions answered. The benefits, risks, alternatives and complications were also discussed. The patient understands and wishes to proceed with the procedure. Written consent was obtained. Ultrasound was performed to localize and mark an adequate pocket of fluid in the right posterior chest. The area was then prepped and draped in the normal  sterile fashion. 1% Lidocaine was used for local anesthesia. Under ultrasound guidance a Safe-T-Centesis catheter catheter was introduced. Thoracentesis was performed. The catheter was removed and a dressing applied. FINDINGS: A total of approximately 1.1 L of amber colored fluid was removed. Samples were sent to the laboratory as requested by the clinical team. IMPRESSION: Successful ultrasound guided right thoracentesis yielding 1.1 L of pleural fluid.  Electronically Signed   By: Markus Daft M.D.   On: 01/20/2016 12:37     Echo pending  TELEMETRY: Atrial flutter at 71 bpm  ASSESSMENT AND PLAN:  Principal Problem:   Acute on chronic respiratory failure with hypoxia (HCC) Active Problems:   Paroxysmal atrial fibrillation (HCC)   COPD exacerbation (HCC)   Essential hypertension   Recurrent right pleural effusion   HLD (hyperlipidemia)    1. Atrial fibrillation/atrial flutter, rate controlled, chads Vasc 3, on Pradaxa 2. COPD/recurrent pneumonia/recurrent right-sided pleural effusion, status post thoracentesis, with clinical improvement 3. History of mildly reduced left ventricular function, [id number] [id number], chronic systolic congestive heart failure, with mild peripheral edema  Recommendations  1. Continue current medications 2. Continue low-dose furosemide 3. Review 2-D echocardiogram 4. Consider discharge home if patient does well today     Dorrine Montone, MD, PhD, [facility] 01/21/2016 10:35 AM

## 2016-01-21 NOTE — Progress Notes (Signed)
[facility] [place] NAME: Elaine Decker    MR#:  [id number]  DATE OF BIRTH:  [birth year]  SUBJECTIVE:  Patient admitted to the hospital 01/19/16 with shortness of breath and she she's been dealing with intermittently for many months. She also complains of having some lower extremity edema, nonproductive cough denies chest pain fevers chills. Patient underwent thoracentesis which was consistent with transudate, cytology is pending, culture showed no growth so far.  No acute issues overnight, still complains of shortness of breath mainly with activity still complains of nonproductive cough. She does have lower extremity edema, Doppler ultrasound is pending, as well as abdominal ultrasound for ascites evaluation. Pulmonologist, Dr. Humphrey Rolls saw patient in consultation and felt that CT of the chest as needed to reassess underlying lung. He also recommended bronchoscopy to rule out obstruction and lung collapse. Cardiologist recommended to continue low-dose furosemide. Get echocardiogram for  A. fib/atrial flutter, and discharge home if patient does well Patient remains on 5 L of oxygen through nasal cannula, she is on 3 L at home REVIEW OF SYSTEMS:  CONSTITUTIONAL: No fever, positive fatigue or weakness.  EYES: No blurred or double vision.  EARS, NOSE, AND THROAT: No tinnitus or ear pain.  RESPIRATORY: Positive cough, shortness of breath, denies wheezing or hemoptysis.  CARDIOVASCULAR: No chest pain, orthopnea, positive edema.  GASTROINTESTINAL: No nausea, vomiting, diarrhea or abdominal pain.  GENITOURINARY: No dysuria, hematuria.  ENDOCRINE: No polyuria, nocturia,  HEMATOLOGY: No anemia, easy bruising or bleeding SKIN: No rash or lesion. MUSCULOSKELETAL: No joint pain or arthritis.   NEUROLOGIC: No tingling, numbness, weakness.  PSYCHIATRY: No anxiety or depression.   DRUG ALLERGIES:  No Known Allergies  VITALS:  Blood pressure 121/77, pulse 76,  temperature 98.5 F (36.9 C), temperature source Oral, resp. rate 21, height 5\' 3"  (1.6 m), weight 84.006 kg (185 lb 3.2 oz), SpO2 95 %.  PHYSICAL EXAMINATION:  VITAL SIGNS: Filed Vitals:   01/21/16 0515 01/21/16 1111  BP: 113/80 121/77  Pulse: 67 76  Temp: 98.1 F (36.7 C) 98.5 F (36.9 C)  Resp: 20 21   GENERAL:70 y.o.female currently in no acute distress.  HEAD: Normocephalic, atraumatic.  EYES: Pupils equal, round, reactive to light. Extraocular muscles intact. No scleral icterus.  MOUTH: Moist mucosal membrane. Dentition intact. No abscess noted.  EAR, NOSE, THROAT: Clear without exudates. No external lesions.  NECK: Supple. No thyromegaly. No nodules. No JVD.  PULMONARY: Diminished breath sounds in all lung fields without wheeze rails or rhonci. No use of accessory muscles, poor respiratory effort. Poor air entry bilaterally CHEST: Nontender to palpation.  CARDIOVASCULAR: S1 and S2. Regular rate and rhythm. No murmurs, rubs, or gallops. 2+ edema. Pedal pulses 2+ bilaterally.  GASTROINTESTINAL: Soft, nontender, nondistended. No masses. Positive bowel sounds. No hepatosplenomegaly.  MUSCULOSKELETAL: No swelling, clubbing, or edema. Range of motion full in all extremities.  NEUROLOGIC: Cranial nerves II through XII are intact. No gross focal neurological deficits. Sensation intact. Reflexes intact.  SKIN: No ulceration, lesions, rashes, or cyanosis. Skin warm and dry. Turgor intact.  PSYCHIATRIC: Mood, affect within normal limits. The patient is awake, alert and oriented x 3. Insight, judgment intact.      LABORATORY PANEL:   CBC  Recent Labs Lab 01/20/16 0534  WBC 8.9  HGB 12.0  HCT 38.8  PLT 243   ------------------------------------------------------------------------------------------------------------------  Chemistries   Recent Labs Lab 01/20/16 0534  NA 145  K 4.3  CL 101  CO2 40*  GLUCOSE 131*  BUN 14  CREATININE 0.77  CALCIUM 9.1    ------------------------------------------------------------------------------------------------------------------  Cardiac Enzymes  Recent Labs Lab 01/19/16 1646  TROPONINI <0.03   ------------------------------------------------------------------------------------------------------------------  RADIOLOGY:  Dg Chest 1 View  01/20/2016  CLINICAL DATA:  Status post 1.1 L right thoracentesis. EXAM: CHEST 1 VIEW COMPARISON:  01/20/2016, 01/18/2016, 01/12/2016 FINDINGS: Significant reduction in the right effusion following right thoracentesis. No pneumothorax. Trace bilateral pleural effusions persist with bibasilar streaky atelectasis/ airspace disease. Heart is enlarged. Mild central vascular congestion. Remote bilateral rib fractures. Trachea is midline. Atherosclerosis of aorta. IMPRESSION: Negative for pneumothorax following right thoracentesis. Electronically Signed   By: Jerilynn Mages.  Shick M.D.   On: 01/20/2016 10:56   Ct Chest W Contrast  01/21/2016  CLINICAL DATA:  Patient with history of pleural effusion. Shortness of breath and chest pressure. EXAM: CT CHEST WITH CONTRAST TECHNIQUE: Multidetector CT imaging of the chest was performed during intravenous contrast administration. CONTRAST:  41mL OMNIPAQUE IOHEXOL 300 MG/ML  SOLN COMPARISON:  Chest radiograph 01/20/2016; chest CT 01/17/2006 FINDINGS: Mediastinum/Nodes: Visualized thyroid is unremarkable. No enlarged axillary, mediastinal or hilar lymphadenopathy. Multiple sub cm mediastinal lymph nodes are demonstrated. Cardiomegaly. The main pulmonary artery is enlarged measuring up to 3.8 cm. The ascending aorta is dilated measuring 3.9 cm. Lungs/Pleura: Central airways are patent. Centrilobular and paraseptal emphysematous change. Persistent moderate right pleural effusion. Interval increase in size of small left pleural effusion. No pneumothorax. Re- demonstrated consolidation within the right lower lobe. Unchanged heterogeneous opacities within the  right middle and right upper lobes. Increased consolidation left lower lobe. Upper abdomen: Unchanged bilateral adrenal nodules, 1.6 cm on the right and 2.2 cm left. Unchanged probable cyst within the superior pole of the right kidney. Musculoskeletal: No aggressive or acute appearing osseous lesions. Multiple old healed left-sided rib fractures. IMPRESSION: Worsening consolidation left lower lobe with increased left pleural effusion. Additionally there is unchanged right lower and right middle lobe consolidation. Overall findings are concerning for multi focal pneumonia. Continued follow-up to resolution is recommended. Moderate right pleural effusion. Interval increase in size of small left pleural effusion. Enlarged main pulmonary artery as can be seen with pulmonary arterial hypertension. Electronically Signed   By: Lovey Newcomer M.D.   On: 01/21/2016 13:24   US Abdomen Limited  01/21/2016  CLINICAL DATA:  Leg swelling and right-sided pleural effusion. Evaluation for ascites. EXAM: LIMITED ABDOMEN ULTRASOUND FOR ASCITES TECHNIQUE: Limited ultrasound survey for ascites was performed in all four abdominal quadrants. COMPARISON:  None. FINDINGS: Ultrasound examination of all 4 quadrants of the abdomen did not demonstrate any significant fluid. IMPRESSION: No ascites visualized. Electronically Signed   By: Logan Bores M.D.   On: 01/21/2016 12:55   US Venous Img Lower Bilateral  01/21/2016  CLINICAL DATA:  Patient with bilateral lower extremity swelling for 4 months. EXAM: BILATERAL LOWER EXTREMITY VENOUS DOPPLER ULTRASOUND TECHNIQUE: Gray-scale sonography with graded compression, as well as color Doppler and duplex ultrasound were performed to evaluate the lower extremity deep venous systems from the level of the common femoral vein and including the common femoral, femoral, profunda femoral, popliteal and calf veins including the posterior tibial, peroneal and gastrocnemius veins when visible. The superficial  great saphenous vein was also interrogated. Spectral Doppler was utilized to evaluate flow at rest and with distal augmentation maneuvers in the common femoral, femoral and popliteal veins. COMPARISON:  None. FINDINGS: RIGHT LOWER EXTREMITY Common Femoral Vein: No evidence of thrombus. Normal compressibility, respiratory phasicity and response to augmentation. Saphenofemoral Junction: No  evidence of thrombus. Normal compressibility and flow on color Doppler imaging. Profunda Femoral Vein: No evidence of thrombus. Normal compressibility and flow on color Doppler imaging. Femoral Vein: No evidence of thrombus. Normal compressibility, respiratory phasicity and response to augmentation. Popliteal Vein: No evidence of thrombus. Normal compressibility, respiratory phasicity and response to augmentation. Calf Veins: No evidence of thrombus. Normal compressibility and flow on color Doppler imaging. Superficial Great Saphenous Vein: No evidence of thrombus. Normal compressibility and flow on color Doppler imaging. Venous Reflux:  None. Other Findings: Probable 3.0 cm baker cyst within the popliteal fossa. LEFT LOWER EXTREMITY Common Femoral Vein: No evidence of thrombus. Normal compressibility, respiratory phasicity and response to augmentation. Saphenofemoral Junction: No evidence of thrombus. Normal compressibility and flow on color Doppler imaging. Profunda Femoral Vein: No evidence of thrombus. Normal compressibility and flow on color Doppler imaging. Femoral Vein: No evidence of thrombus. Normal compressibility, respiratory phasicity and response to augmentation. Popliteal Vein: No evidence of thrombus. Normal compressibility, respiratory phasicity and response to augmentation. Calf Veins: No evidence of thrombus. Normal compressibility and flow on color Doppler imaging. Superficial Great Saphenous Vein: No evidence of thrombus. Normal compressibility and flow on color Doppler imaging. Venous Reflux:  None. Other  Findings:  None. IMPRESSION: No evidence of deep venous thrombosis. Electronically Signed   By: Lovey Newcomer M.D.   On: 01/21/2016 12:53   US Thoracentesis Asp Pleural Space W/img Guide  01/20/2016  INDICATION: Recurrent right pleural effusion and difficulty breathing. EXAM: ULTRASOUND GUIDED RIGHT THORACENTESIS MEDICATIONS: None. COMPLICATIONS: None immediate. PROCEDURE: An ultrasound guided thoracentesis was thoroughly discussed with the patient and questions answered. The benefits, risks, alternatives and complications were also discussed. The patient understands and wishes to proceed with the procedure. Written consent was obtained. Ultrasound was performed to localize and mark an adequate pocket of fluid in the right posterior chest. The area was then prepped and draped in the normal sterile fashion. 1% Lidocaine was used for local anesthesia. Under ultrasound guidance a Safe-T-Centesis catheter catheter was introduced. Thoracentesis was performed. The catheter was removed and a dressing applied. FINDINGS: A total of approximately 1.1 L of amber colored fluid was removed. Samples were sent to the laboratory as requested by the clinical team. IMPRESSION: Successful ultrasound guided right thoracentesis yielding 1.1 L of pleural fluid. Electronically Signed   By: Markus Daft M.D.   On: 01/20/2016 12:37    EKG:   Orders placed or performed during the hospital encounter of 01/19/16  . ED EKG  . ED EKG  . EKG 12-Lead  . EKG 12-Lead  . EKG    ASSESSMENT AND PLAN:  70 year old Caucasian female history of COPD, systolic congestive heart failure, former tobacco use presenting with shortness of breath admitted to the hospital 01/19/16  1. Acute on chronic respiratory failure with hypoxia, likely multifactorial due to pleural effusion, multifocal pneumonia, COPD exacerbation, systolic congestive heart failure. She has radiographic evidence but no real signs or symptoms of infection at this point in time  including fevers, chills, leukocytosis-, procalcitonin is pending to rule out infectious etiology Status post left thoracentesis on the February 3rd  with 1.1 L of fluid removal Cardiology input appreciated, we'll continue diuresis follow urine output renal function, ins and outs and weight.  Continue DuoNeb, steroids. Chest x-ray revealed worsening consolidation of left lower lobe with increased left pleural effusion, appreciate pulmonary consultation, likely bronchoscopy on Monday.  2. Paroxysmal atrial fibrillation: Pradaxa, rate controlled on propranolol 3. Essential hypertension: Lisinopril, propranolol. Blood pressure is  well controlled 4. Hyperlipidemia unspecified statin therapy 5. Venous thromboembolism prophylactic: Therapeutic pradaxa 6. Hyperglycemia. Get hemoglobin A1c 7. Lower extremity swelling, no DVT     All the records are reviewed and case discussed with Care Management/Social Workerr. Management plans discussed with the patient, family and they are in agreement.  CODE STATUS: Full  TOTAL TIME TAKING CARE OF THIS PATIENT: 40 minutes.  Discussed this patient's family for about 10-15 minutes, all questions were answered POSSIBLE D/C IN 2-3 DAYS, DEPENDING ON CLINICAL CONDITION.   Theodoro Grist M.D on 01/21/2016 at 2:26 PM  Between 7am to 6pm - Pager - [phone]  After 6pm: House Pager: - [phone]  [facility]  Office  [phone]  CC: Primary care physician; Glendon Axe, MD

## 2016-01-22 LAB — GLUCOSE, CAPILLARY
GLUCOSE-CAPILLARY: 102 mg/dL — AB (ref 65–99)
GLUCOSE-CAPILLARY: 138 mg/dL — AB (ref 65–99)
GLUCOSE-CAPILLARY: 178 mg/dL — AB (ref 65–99)
Glucose-Capillary: 152 mg/dL — ABNORMAL HIGH (ref 65–99)

## 2016-01-22 LAB — BASIC METABOLIC PANEL
ANION GAP: 8 (ref 5–15)
BUN: 13 mg/dL (ref 6–20)
CALCIUM: 9.2 mg/dL (ref 8.9–10.3)
CO2: 40 mmol/L — ABNORMAL HIGH (ref 22–32)
CREATININE: 0.6 mg/dL (ref 0.44–1.00)
Chloride: 95 mmol/L — ABNORMAL LOW (ref 101–111)
Glucose, Bld: 129 mg/dL — ABNORMAL HIGH (ref 65–99)
Potassium: 3.9 mmol/L (ref 3.5–5.1)
SODIUM: 143 mmol/L (ref 135–145)

## 2016-01-22 LAB — SURGICAL PCR SCREEN
MRSA, PCR: NEGATIVE
STAPHYLOCOCCUS AUREUS: NEGATIVE

## 2016-01-22 LAB — PROCALCITONIN: Procalcitonin: <0.1 ng/mL

## 2016-01-22 MED ORDER — VANCOMYCIN HCL IN DEXTROSE 1-5 GM/200ML-% IV SOLN
1000.0000 mg | Freq: Once | INTRAVENOUS | Status: AC
  Administered 2016-01-22: 1000 mg via INTRAVENOUS
  Filled 2016-01-22: qty 200

## 2016-01-22 MED ORDER — PIPERACILLIN-TAZOBACTAM 3.375 G IVPB
3.3750 g | Freq: Three times a day (TID) | INTRAVENOUS | Status: AC
  Administered 2016-01-22: 3.375 g via INTRAVENOUS
  Filled 2016-01-22: qty 50

## 2016-01-22 MED ORDER — VANCOMYCIN HCL 10 G IV SOLR
1500.0000 mg | Freq: Two times a day (BID) | INTRAVENOUS | Status: DC
  Administered 2016-01-22 – 2016-01-23 (×2): 1500 mg via INTRAVENOUS
  Filled 2016-01-22 (×2): qty 1500

## 2016-01-22 MED ORDER — PIPERACILLIN-TAZOBACTAM 3.375 G IVPB
3.3750 g | Freq: Three times a day (TID) | INTRAVENOUS | Status: DC
  Administered 2016-01-22 – 2016-01-23 (×3): 3.375 g via INTRAVENOUS
  Filled 2016-01-22 (×5): qty 50

## 2016-01-22 NOTE — Progress Notes (Signed)
[facility] [place] NAME: Elaine Decker    MR#:  [id number]  DATE OF BIRTH:  06/20/1946  SUBJECTIVE:  Patient admitted to the hospital 01/19/16 with shortness of breath and she she's been dealing with intermittently for many months. She also complains of having some lower extremity edema, nonproductive cough denies chest pain fevers chills. Patient underwent thoracentesis which was consistent with transudate, cytology is pending, culture showed no growth so far.  No acute issues overnight, still complains of shortness of breath mainly with activity still complains of nonproductive cough. She does have lower extremity edema, Doppler ultrasound is negative for DVT,  abdominal ultrasound was negative  for ascites . CT of chest revealed multifocal pneumonia and patient was initiated on broad-spectrum antibiotic therapy with Zithromax, vancomycin as well as Zosyn . She is lifting up yellowish looking thick phlegm .  Pulmonologist, Dr. Humphrey Rolls saw patient in consultation and  recommended bronchoscopy likely next week. Cardiologist recommended to continue low-dose furosemide. echocardiogram for  A. fib/atrial flutter was performed today, however, results are still pending, . Poor quality study.  Patient remains on  high oxygen requirement, down from 5 L to 4 L today, she is on 3 L at home Feels comfortable today  REVIEW OF SYSTEMS:  CONSTITUTIONAL: No fever, positive fatigue or weakness.  EYES: No blurred or double vision.  EARS, NOSE, AND THROAT: No tinnitus or ear pain.  RESPIRATORY: Positive cough, shortness of breath, denies wheezing or hemoptysis.  CARDIOVASCULAR: No chest pain, orthopnea, positive edema.  GASTROINTESTINAL: No nausea, vomiting, diarrhea or abdominal pain.  GENITOURINARY: No dysuria, hematuria.  ENDOCRINE: No polyuria, nocturia,  HEMATOLOGY: No anemia, easy bruising or bleeding SKIN: No rash or lesion. MUSCULOSKELETAL: No joint pain or  arthritis.   NEUROLOGIC: No tingling, numbness, weakness.  PSYCHIATRY: No anxiety or depression.   DRUG ALLERGIES:  No Known Allergies  VITALS:  Blood pressure 119/73, pulse 68, temperature 97.8 F (36.6 C), temperature source Oral, resp. rate 18, height 5\' 3"  (1.6 m), weight 83.28 kg (183 lb 9.6 oz), SpO2 91 %.  PHYSICAL EXAMINATION:  VITAL SIGNS: Filed Vitals:   01/22/16 0600 01/22/16 1111  BP:  119/73  Pulse:  68  Temp: 98.3 F (36.8 C) 97.8 F (36.6 C)  Resp:  18   GENERAL:70 y.o.female currently in no acute distress.  HEAD: Normocephalic, atraumatic.  EYES: Pupils equal, round, reactive to light. Extraocular muscles intact. No scleral icterus.  MOUTH: Moist mucosal membrane. Dentition intact. No abscess noted.  EAR, NOSE, THROAT: Clear without exudates. No external lesions.  NECK: Supple. No thyromegaly. No nodules. No JVD.  PULMONARY: Diminished breath sounds in all lung fields ,  intermittent wheezes,  rales And rhonchi. No use of accessory muscles, poor respiratory effort. Poor air entry bilaterally CHEST: Nontender to palpation.  CARDIOVASCULAR: S1 and S2. Regular rate and rhythm. No murmurs, rubs, or gallops. 2+ edema. Pedal pulses 2+ bilaterally.  GASTROINTESTINAL: Soft, nontender, nondistended. No masses. Positive bowel sounds. No hepatosplenomegaly.  MUSCULOSKELETAL:2+ lower extremity and ankle as well as feet swelling, , no cyanosis, or clubbing. Range of motion full in all extremities.  NEUROLOGIC: Cranial nerves II through XII are intact. No gross focal neurological deficits. Sensation intact. Reflexes intact.  SKIN: No ulceration, lesions, rashes, or cyanosis. Skin warm and dry. Turgor intact.  PSYCHIATRIC: Mood, affect within normal limits. The patient is awake, alert and oriented x 3. Insight, judgment intact.      LABORATORY PANEL:  CBC  Recent Labs Lab 01/20/16 0534  WBC 8.9  HGB 12.0  HCT 38.8  PLT 243    ------------------------------------------------------------------------------------------------------------------  Chemistries   Recent Labs Lab 01/22/16 0504  NA 143  K 3.9  CL 95*  CO2 40*  GLUCOSE 129*  BUN 13  CREATININE 0.60  CALCIUM 9.2   ------------------------------------------------------------------------------------------------------------------  Cardiac Enzymes  Recent Labs Lab 01/19/16 1646  TROPONINI <0.03   ------------------------------------------------------------------------------------------------------------------  RADIOLOGY:  Ct Chest W Contrast  01/21/2016  CLINICAL DATA:  Patient with history of pleural effusion. Shortness of breath and chest pressure. EXAM: CT CHEST WITH CONTRAST TECHNIQUE: Multidetector CT imaging of the chest was performed during intravenous contrast administration. CONTRAST:  34mL OMNIPAQUE IOHEXOL 300 MG/ML  SOLN COMPARISON:  Chest radiograph 01/20/2016; chest CT 01/17/2006 FINDINGS: Mediastinum/Nodes: Visualized thyroid is unremarkable. No enlarged axillary, mediastinal or hilar lymphadenopathy. Multiple sub cm mediastinal lymph nodes are demonstrated. Cardiomegaly. The main pulmonary artery is enlarged measuring up to 3.8 cm. The ascending aorta is dilated measuring 3.9 cm. Lungs/Pleura: Central airways are patent. Centrilobular and paraseptal emphysematous change. Persistent moderate right pleural effusion. Interval increase in size of small left pleural effusion. No pneumothorax. Re- demonstrated consolidation within the right lower lobe. Unchanged heterogeneous opacities within the right middle and right upper lobes. Increased consolidation left lower lobe. Upper abdomen: Unchanged bilateral adrenal nodules, 1.6 cm on the right and 2.2 cm left. Unchanged probable cyst within the superior pole of the right kidney. Musculoskeletal: No aggressive or acute appearing osseous lesions. Multiple old healed left-sided rib fractures.  IMPRESSION: Worsening consolidation left lower lobe with increased left pleural effusion. Additionally there is unchanged right lower and right middle lobe consolidation. Overall findings are concerning for multi focal pneumonia. Continued follow-up to resolution is recommended. Moderate right pleural effusion. Interval increase in size of small left pleural effusion. Enlarged main pulmonary artery as can be seen with pulmonary arterial hypertension. Electronically Signed   By: Lovey Newcomer M.D.   On: 01/21/2016 13:24   US Abdomen Limited  01/21/2016  CLINICAL DATA:  Leg swelling and right-sided pleural effusion. Evaluation for ascites. EXAM: LIMITED ABDOMEN ULTRASOUND FOR ASCITES TECHNIQUE: Limited ultrasound survey for ascites was performed in all four abdominal quadrants. COMPARISON:  None. FINDINGS: Ultrasound examination of all 4 quadrants of the abdomen did not demonstrate any significant fluid. IMPRESSION: No ascites visualized. Electronically Signed   By: Logan Bores M.D.   On: 01/21/2016 12:55   US Venous Img Lower Bilateral  01/21/2016  CLINICAL DATA:  Patient with bilateral lower extremity swelling for 4 months. EXAM: BILATERAL LOWER EXTREMITY VENOUS DOPPLER ULTRASOUND TECHNIQUE: Gray-scale sonography with graded compression, as well as color Doppler and duplex ultrasound were performed to evaluate the lower extremity deep venous systems from the level of the common femoral vein and including the common femoral, femoral, profunda femoral, popliteal and calf veins including the posterior tibial, peroneal and gastrocnemius veins when visible. The superficial great saphenous vein was also interrogated. Spectral Doppler was utilized to evaluate flow at rest and with distal augmentation maneuvers in the common femoral, femoral and popliteal veins. COMPARISON:  None. FINDINGS: RIGHT LOWER EXTREMITY Common Femoral Vein: No evidence of thrombus. Normal compressibility, respiratory phasicity and response to  augmentation. Saphenofemoral Junction: No evidence of thrombus. Normal compressibility and flow on color Doppler imaging. Profunda Femoral Vein: No evidence of thrombus. Normal compressibility and flow on color Doppler imaging. Femoral Vein: No evidence of thrombus. Normal compressibility, respiratory phasicity and response to augmentation. Popliteal Vein: No evidence  of thrombus. Normal compressibility, respiratory phasicity and response to augmentation. Calf Veins: No evidence of thrombus. Normal compressibility and flow on color Doppler imaging. Superficial Great Saphenous Vein: No evidence of thrombus. Normal compressibility and flow on color Doppler imaging. Venous Reflux:  None. Other Findings: Probable 3.0 cm baker cyst within the popliteal fossa. LEFT LOWER EXTREMITY Common Femoral Vein: No evidence of thrombus. Normal compressibility, respiratory phasicity and response to augmentation. Saphenofemoral Junction: No evidence of thrombus. Normal compressibility and flow on color Doppler imaging. Profunda Femoral Vein: No evidence of thrombus. Normal compressibility and flow on color Doppler imaging. Femoral Vein: No evidence of thrombus. Normal compressibility, respiratory phasicity and response to augmentation. Popliteal Vein: No evidence of thrombus. Normal compressibility, respiratory phasicity and response to augmentation. Calf Veins: No evidence of thrombus. Normal compressibility and flow on color Doppler imaging. Superficial Great Saphenous Vein: No evidence of thrombus. Normal compressibility and flow on color Doppler imaging. Venous Reflux:  None. Other Findings:  None. IMPRESSION: No evidence of deep venous thrombosis. Electronically Signed   By: Lovey Newcomer M.D.   On: 01/21/2016 12:53    EKG:   Orders placed or performed during the hospital encounter of 01/19/16  . ED EKG  . ED EKG  . EKG 12-Lead  . EKG 12-Lead  . EKG    ASSESSMENT AND PLAN:  71 year old Caucasian female history of  COPD, systolic congestive heart failure, former tobacco use presenting with shortness of breath admitted to the hospital 01/19/16  *. Acute on chronic respiratory failure with hypoxia and likely hypercapnia, multifactorial  due to pleural effusion, multifocal pneumonia, COPD exacerbation, systolic congestive heart failure. Status post left thoracentesis on the February 3rd  with 1.1 L of fluid removal. Continue patient on broad-spectrum antibiotic therapy as well as Lasix , following clinically , weaning off oxygen as tolerated  Cardiology input appreciated, we'll continue diuresis follow urine output renal function, ins and outs and weight, Patient is net negative of approximately 4.1 L since admission  Continue DuoNeb, steroids. , pulmonologist recommends to repeat CT scan of the chest after antibiotic therapy and possible bronchoscopy on Monday.   *Multifocal pneumonia , sputum cultures if possible. Continue patient on broad-spectrum antibiotic therapy with vancomycin, Zosyn and Zithromax   *Acute on chronic systolic CHF , echocardiogram is pending done during this admission , continue patient on lisinopril, propranolol and Lasix , patient is net negative of approximately 4 L since admission   *. Paroxysmal atrial fibrillation: Pradaxa, rate controlled on propranolol  *. Essential hypertension: Lisinopril, propranolol. Blood pressure is well controlled  *. Hyperlipidemia unspecified statin therapy  *. Venous thromboembolism prophylactic: Therapeutic pradaxa  *. Hyperglycemia. hemoglobin A1c 6.3 ,  concerning for prediabetes, initiate diabetic diet and diabetic education  *. Lower extremity swelling, no DVT     All the records are reviewed and case discussed with Care Management/Social Workerr. Management plans discussed with the patient, family and they are in agreement.  CODE STATUS: Full  TOTAL TIME TAKING CARE OF THIS PATIENT: 40 minutes.  Treatment plan as well as discharge  planning was discussed this patient extensively today  POSSIBLE D/C IN 2-3 DAYS, DEPENDING ON CLINICAL CONDITION.   Theodoro Grist M.D on 01/22/2016 at 1:34 PM  Between 7am to 6pm - Pager - [phone]  After 6pm: House Pager: - [phone]  [facility]  Office  [phone]  CC: Primary care physician; Glendon Axe, MD

## 2016-01-22 NOTE — Consult Note (Signed)
Pharmacy Antibiotic Note  Elaine Decker is a 70 y.o. female admitted on 01/19/2016 with pneumonia.  Pharmacy has been consulted for vancomycin and zosyn dosing.  Plan: Vancomycin 1500mg  IV every 12 hours.  Goal trough 15-20 mcg/mL. Zosyn 3.375g IV q8h (4 hour infusion).  Check Vanc trough on 2/6 at 1500, prior to 4th dose.  Anticipate removal of Vancomycin if pending MRSA PCR is negative.  Ke= 0.082 hr-1 T1/2= 8.45 hrs Vd= 58.31 L Cpk= 45.6, Ctr= 18.5  Height: 5\' 3"  (160 cm) Weight: 183 lb 9.6 oz (83.28 kg) IBW/kg (Calculated) : 52.4  Temp (24hrs), Avg:97.9 F (36.6 C), Min:97.5 F (36.4 C), Max:98.3 F (36.8 C)   Recent Labs Lab 01/18/16 0917 01/19/16 1646 01/20/16 0534 01/22/16 0504  WBC  --  10.0 8.9  --   CREATININE 0.70 0.73 0.77 0.60    Estimated Creatinine Clearance: 69.8 mL/min (by C-G formula based on Cr of 0.6).    No Known Allergies  Antimicrobials this admission: Anti-infectives    Start     Dose/Rate Route Frequency Ordered Stop   01/22/16 1730  piperacillin-tazobactam (ZOSYN) IVPB 3.375 g     3.375 g 12.5 mL/hr over 240 Minutes Intravenous 3 times per day 01/22/16 1221     01/22/16 1530  vancomycin (VANCOCIN) 1,500 mg in sodium chloride 0.9 % 500 mL IVPB     1,500 mg 250 mL/hr over 120 Minutes Intravenous Every 12 hours 01/22/16 1221     01/22/16 0900  piperacillin-tazobactam (ZOSYN) IVPB 3.375 g     3.375 g 12.5 mL/hr over 240 Minutes Intravenous 3 times per day 01/22/16 0840 01/22/16 1359   01/22/16 0900  vancomycin (VANCOCIN) IVPB 1000 mg/200 mL premix     1,000 mg 200 mL/hr over 60 Minutes Intravenous  Once 01/22/16 0840 01/22/16 1034      Microbiology results: Results for orders placed or performed during the hospital encounter of 01/19/16  Body fluid culture     Status: None (Preliminary result)   Collection Time: 01/20/16 10:10 AM  Result Value Ref Range Status   Specimen Description CYTO PLEU  Final   Special Requests NONE  Final   Gram Stain FEW WBC SEEN NO ORGANISMS SEEN   Final   Culture NO GROWTH 2 DAYS  Final   Report Status PENDING  Incomplete  Surgical pcr screen     Status: None   Collection Time: 01/22/16  9:50 AM  Result Value Ref Range Status   MRSA, PCR NEGATIVE NEGATIVE Final   Staphylococcus aureus NEGATIVE NEGATIVE Final    Comment:        The Xpert SA Assay (FDA approved for NASAL specimens in patients over 47 years of age), is one component of a comprehensive surveillance program.  Test performance has been validated by [facility] for patients greater than or equal to 13 year old. It is not intended to diagnose infection nor to guide or monitor treatment.     Thank you for allowing pharmacy to be a part of this patient's care.  Roe Coombs, PharmD Pharmacy Resident 01/22/2016

## 2016-01-22 NOTE — Progress Notes (Signed)
[facility]  SUBJECTIVE: I'm feeling better   Filed Vitals:   01/22/16 0501 01/22/16 0557 01/22/16 0600 01/22/16 0814  BP: 135/79     Pulse: 99     Temp:   98.3 F (36.8 C)   TempSrc:   Oral   Resp: 18     Height:      Weight:  83.28 kg (183 lb 9.6 oz)    SpO2: 97%   95%     Intake/Output Summary (Last 24 hours) at 01/22/16 1101 Last data filed at 01/22/16 1037  Gross per 24 hour  Intake    720 ml  Output   2500 ml  Net  -1780 ml      PHYSICAL EXAM  General: Well developed, well nourished, in no acute distress HEENT:  Normocephalic and atramatic Neck:  No JVD.  Lungs: Clear bilaterally to auscultation and percussion. Heart: HRRR . Normal S1 and S2 without gallops or murmurs.  Abdomen: Bowel sounds are positive, abdomen soft and non-tender  Msk:  Back normal, normal gait. Normal strength and tone for age. Extremities: No clubbing, cyanosis or edema.   Neuro: Alert and oriented X 3. Psych:  Good affect, responds appropriately   LABS: Basic Metabolic Panel:  Recent Labs  01/20/16 0534 01/22/16 0504  NA 145 143  K 4.3 3.9  CL 101 95*  CO2 40* 40*  GLUCOSE 131* 129*  BUN 14 13  CREATININE 0.77 0.60  CALCIUM 9.1 9.2   Liver Function Tests: No results for input(s): AST, ALT, ALKPHOS, BILITOT, PROT, ALBUMIN in the last 72 hours. No results for input(s): LIPASE, AMYLASE in the last 72 hours. CBC:  Recent Labs  01/19/16 1646 01/20/16 0534  WBC 10.0 8.9  NEUTROABS 8.1*  --   HGB 12.2 12.0  HCT 38.8 38.8  MCV 93.6 97.0  PLT 243 243   Cardiac Enzymes:  Recent Labs  01/19/16 1646  TROPONINI <0.03   BNP: Invalid input(s): POCBNP D-Dimer: No results for input(s): DDIMER in the last 72 hours. Hemoglobin A1C:  Recent Labs  01/20/16 0534  HGBA1C 6.3*   Fasting Lipid Panel: No results for input(s): CHOL, HDL, LDLCALC, TRIG, CHOLHDL, LDLDIRECT in the last 72 hours. Thyroid Function Tests: No results for input(s): TSH, T4TOTAL, T3FREE,  THYROIDAB in the last 72 hours.  Invalid input(s): FREET3 Anemia Panel: No results for input(s): VITAMINB12, FOLATE, FERRITIN, TIBC, IRON, RETICCTPCT in the last 72 hours.  Ct Chest W Contrast  01/21/2016  CLINICAL DATA:  Patient with history of pleural effusion. Shortness of breath and chest pressure. EXAM: CT CHEST WITH CONTRAST TECHNIQUE: Multidetector CT imaging of the chest was performed during intravenous contrast administration. CONTRAST:  51mL OMNIPAQUE IOHEXOL 300 MG/ML  SOLN COMPARISON:  Chest radiograph 01/20/2016; chest CT 01/17/2006 FINDINGS: Mediastinum/Nodes: Visualized thyroid is unremarkable. No enlarged axillary, mediastinal or hilar lymphadenopathy. Multiple sub cm mediastinal lymph nodes are demonstrated. Cardiomegaly. The main pulmonary artery is enlarged measuring up to 3.8 cm. The ascending aorta is dilated measuring 3.9 cm. Lungs/Pleura: Central airways are patent. Centrilobular and paraseptal emphysematous change. Persistent moderate right pleural effusion. Interval increase in size of small left pleural effusion. No pneumothorax. Re- demonstrated consolidation within the right lower lobe. Unchanged heterogeneous opacities within the right middle and right upper lobes. Increased consolidation left lower lobe. Upper abdomen: Unchanged bilateral adrenal nodules, 1.6 cm on the right and 2.2 cm left. Unchanged probable cyst within the superior pole of the right kidney. Musculoskeletal: No aggressive or acute appearing osseous lesions.  Multiple old healed left-sided rib fractures. IMPRESSION: Worsening consolidation left lower lobe with increased left pleural effusion. Additionally there is unchanged right lower and right middle lobe consolidation. Overall findings are concerning for multi focal pneumonia. Continued follow-up to resolution is recommended. Moderate right pleural effusion. Interval increase in size of small left pleural effusion. Enlarged main pulmonary artery as can be seen  with pulmonary arterial hypertension. Electronically Signed   By: Lovey Newcomer M.D.   On: 01/21/2016 13:24   US Abdomen Limited  01/21/2016  CLINICAL DATA:  Leg swelling and right-sided pleural effusion. Evaluation for ascites. EXAM: LIMITED ABDOMEN ULTRASOUND FOR ASCITES TECHNIQUE: Limited ultrasound survey for ascites was performed in all four abdominal quadrants. COMPARISON:  None. FINDINGS: Ultrasound examination of all 4 quadrants of the abdomen did not demonstrate any significant fluid. IMPRESSION: No ascites visualized. Electronically Signed   By: Logan Bores M.D.   On: 01/21/2016 12:55   US Venous Img Lower Bilateral  01/21/2016  CLINICAL DATA:  Patient with bilateral lower extremity swelling for 4 months. EXAM: BILATERAL LOWER EXTREMITY VENOUS DOPPLER ULTRASOUND TECHNIQUE: Gray-scale sonography with graded compression, as well as color Doppler and duplex ultrasound were performed to evaluate the lower extremity deep venous systems from the level of the common femoral vein and including the common femoral, femoral, profunda femoral, popliteal and calf veins including the posterior tibial, peroneal and gastrocnemius veins when visible. The superficial great saphenous vein was also interrogated. Spectral Doppler was utilized to evaluate flow at rest and with distal augmentation maneuvers in the common femoral, femoral and popliteal veins. COMPARISON:  None. FINDINGS: RIGHT LOWER EXTREMITY Common Femoral Vein: No evidence of thrombus. Normal compressibility, respiratory phasicity and response to augmentation. Saphenofemoral Junction: No evidence of thrombus. Normal compressibility and flow on color Doppler imaging. Profunda Femoral Vein: No evidence of thrombus. Normal compressibility and flow on color Doppler imaging. Femoral Vein: No evidence of thrombus. Normal compressibility, respiratory phasicity and response to augmentation. Popliteal Vein: No evidence of thrombus. Normal compressibility, respiratory  phasicity and response to augmentation. Calf Veins: No evidence of thrombus. Normal compressibility and flow on color Doppler imaging. Superficial Great Saphenous Vein: No evidence of thrombus. Normal compressibility and flow on color Doppler imaging. Venous Reflux:  None. Other Findings: Probable 3.0 cm baker cyst within the popliteal fossa. LEFT LOWER EXTREMITY Common Femoral Vein: No evidence of thrombus. Normal compressibility, respiratory phasicity and response to augmentation. Saphenofemoral Junction: No evidence of thrombus. Normal compressibility and flow on color Doppler imaging. Profunda Femoral Vein: No evidence of thrombus. Normal compressibility and flow on color Doppler imaging. Femoral Vein: No evidence of thrombus. Normal compressibility, respiratory phasicity and response to augmentation. Popliteal Vein: No evidence of thrombus. Normal compressibility, respiratory phasicity and response to augmentation. Calf Veins: No evidence of thrombus. Normal compressibility and flow on color Doppler imaging. Superficial Great Saphenous Vein: No evidence of thrombus. Normal compressibility and flow on color Doppler imaging. Venous Reflux:  None. Other Findings:  None. IMPRESSION: No evidence of deep venous thrombosis. Electronically Signed   By: Lovey Newcomer M.D.   On: 01/21/2016 12:53     Echo pending  TELEMETRY: Atrial flutter:  ASSESSMENT AND PLAN:  Principal Problem:   Acute on chronic respiratory failure with hypoxia (HCC) Active Problems:   Paroxysmal atrial fibrillation (HCC)   COPD exacerbation (HCC)   Essential hypertension   Recurrent right pleural effusion   HLD (hyperlipidemia)    1. Atrial fibrillation/flutter, rate controlled, chads Vasc 3, on pradaxa 2. COPD/recurrent pneumonia/recurrent  right-sided pleural effusion, status post thoracentesis, stable 3. Mild chronic systolic CHF, known LV EF 45%, repeat echocardiogram pending  Recommendations  1. Continue current  medications 2. Continue Pradaxa for stroke prevention 3. Review 2-D echocardiogram  Signed off for now, please call if any questions   Kimila Papaleo, MD, PhD, [facility] 01/22/2016 11:01 AM

## 2016-01-22 NOTE — Progress Notes (Signed)
Per MD Ether Griffins stop pradaxa to prepare for bronchoscopy

## 2016-01-22 NOTE — Progress Notes (Signed)
Patient refuses bed alarm. 

## 2016-01-22 NOTE — Progress Notes (Signed)
Pulmonary Critical Care  Follow up Consult Note   SELEAH RUBEN [id number] DOB: Mar 24, 1946 DOA: 01/19/2016  PCP: Glendon Axe, MD    HPI: Elaine Decker is a 70 y.o. female seen for follow up. She is comfortable at this time. No distress. No hemoptysis noted. Patient has had a repeat CT scan done. The results are reviewed. She is currently on Vanc and Zosyn. In addition she is on prednisone   Review of Systems:   ROS performed and is unremarkable other than noted above  Past Medical History  Diagnosis Date  . COPD (chronic obstructive pulmonary disease) ([place])   . Hypertension   . Asthma   . CHF (congestive heart failure) ([place])   . A-fib ([place])   . CKD (chronic kidney disease)   . HLD (hyperlipidemia)    Past Surgical History  Procedure Laterality Date  . Surgery on the neck     Social History:  reports that she quit smoking about 8 weeks ago. She does not have any smokeless tobacco history on file. She reports that she does not drink alcohol or use illicit drugs.  No Known Allergies  Family History  Problem Relation Age of Onset  . CAD Mother   . Lung cancer Father     Prior to Admission medications   Medication Sig Start Date End Date Taking? Authorizing Provider  acetaminophen (TYLENOL) 325 MG tablet Take 325-650 mg by mouth at bedtime.   Yes Historical Provider, MD  albuterol (PROVENTIL HFA;VENTOLIN HFA) 108 (90 Base) MCG/ACT inhaler Inhale 2 puffs into the lungs every 4 (four) hours as needed for wheezing or shortness of breath.   Yes Historical Provider, MD  atorvastatin (LIPITOR) 20 MG tablet Take 20 mg by mouth at bedtime.    Yes Historical Provider, MD  cholecalciferol (VITAMIN D) 1000 UNITS tablet Take 1,000 Units by mouth daily.   Yes Historical Provider, MD  dabigatran (PRADAXA) 150 MG CAPS capsule Take 150 mg by mouth 2 (two) times daily.   Yes Historical Provider, MD  diltiazem (DILACOR XR) 240 MG 24 hr capsule Take 240 mg by mouth daily.   Yes Historical  Provider, MD  Fluticasone-Salmeterol (ADVAIR) 250-50 MCG/DOSE AEPB Inhale 1 puff into the lungs 2 (two) times daily.   Yes Historical Provider, MD  ipratropium-albuterol (DUONEB) 0.5-2.5 (3) MG/3ML SOLN Take 3 mLs by nebulization every 6 (six) hours. Patient taking differently: Take 3 mLs by nebulization every 6 (six) hours as needed (for shortness of breath).  11/30/15  Yes Fritzi Mandes, MD  lisinopril (PRINIVIL,ZESTRIL) 2.5 MG tablet Take 2.5 mg by mouth 2 (two) times daily.    Yes Historical Provider, MD  montelukast (SINGULAIR) 10 MG tablet Take 10 mg by mouth at bedtime.    Yes Historical Provider, MD  Multiple Vitamin (MULTIVITAMIN) tablet Take 1 tablet by mouth daily.   Yes Historical Provider, MD  propranolol (INDERAL) 40 MG tablet Take 40 mg by mouth 2 (two) times daily.    Yes Historical Provider, MD  tiotropium (SPIRIVA) 18 MCG inhalation capsule Place 18 mcg into inhaler and inhale daily.   Yes Historical Provider, MD   Physical Exam: Filed Vitals:   01/22/16 0557 01/22/16 0600 01/22/16 0814 01/22/16 1111  BP:    119/73  Pulse:    68  Temp:  98.3 F (36.8 C)  97.8 F (36.6 C)  TempSrc:  Oral  Oral  Resp:    18  Height:      Weight: 83.28 kg (183 lb 9.6  oz)     SpO2:   95% 91%    Wt Readings from Last 3 Encounters:  01/22/16 83.28 kg (183 lb 9.6 oz)  11/28/15 79.833 kg (176 lb)    General:  Appears calm and comfortable Eyes: PERRL, normal lids, irises & conjunctiva ENT: grossly normal hearing, lips & tongue Neck: no LAD, masses or thyromegaly Cardiovascular: RRR, no m/r/g. No LE edema. Respiratory: CTA bilaterally, no w/r/r. Normal respiratory effort. Abdomen: soft, nontender Skin: no rash or induration seen on limited exam Musculoskeletal: grossly normal tone BUE/BLE Psychiatric: grossly normal mood and affect Neurologic: grossly non-focal.          Labs on Admission:  Basic Metabolic Panel:  Recent Labs Lab 01/18/16 0917 01/19/16 1646 01/20/16 0534  01/22/16 0504  NA  --  141 145 143  K  --  4.1 4.3 3.9  CL  --  99* 101 95*  CO2  --  37* 40* 40*  GLUCOSE  --  113* 131* 129*  BUN  --  14 14 13   CREATININE 0.70 0.73 0.77 0.60  CALCIUM  --  9.3 9.1 9.2   Liver Function Tests: No results for input(s): AST, ALT, ALKPHOS, BILITOT, PROT, ALBUMIN in the last 168 hours. No results for input(s): LIPASE, AMYLASE in the last 168 hours. No results for input(s): AMMONIA in the last 168 hours. CBC:  Recent Labs Lab 01/19/16 1646 01/20/16 0534  WBC 10.0 8.9  NEUTROABS 8.1*  --   HGB 12.2 12.0  HCT 38.8 38.8  MCV 93.6 97.0  PLT 243 243   Cardiac Enzymes:  Recent Labs Lab 01/19/16 1646  TROPONINI <0.03    BNP (last 3 results)  Recent Labs  01/19/16 1646  BNP 1048.0*    ProBNP (last 3 results) No results for input(s): PROBNP in the last 8760 hours.  CBG:  Recent Labs Lab 01/21/16 1643 01/21/16 2057 01/22/16 0725 01/22/16 1112  GLUCAP 219* 168* 102* 152*    Radiological Exams on Admission: Ct Chest W Contrast  01/21/2016  CLINICAL DATA:  Patient with history of pleural effusion. Shortness of breath and chest pressure. EXAM: CT CHEST WITH CONTRAST TECHNIQUE: Multidetector CT imaging of the chest was performed during intravenous contrast administration. CONTRAST:  25mL OMNIPAQUE IOHEXOL 300 MG/ML  SOLN COMPARISON:  Chest radiograph 01/20/2016; chest CT 01/17/2006 FINDINGS: Mediastinum/Nodes: Visualized thyroid is unremarkable. No enlarged axillary, mediastinal or hilar lymphadenopathy. Multiple sub cm mediastinal lymph nodes are demonstrated. Cardiomegaly. The main pulmonary artery is enlarged measuring up to 3.8 cm. The ascending aorta is dilated measuring 3.9 cm. Lungs/Pleura: Central airways are patent. Centrilobular and paraseptal emphysematous change. Persistent moderate right pleural effusion. Interval increase in size of small left pleural effusion. No pneumothorax. Re- demonstrated consolidation within the right  lower lobe. Unchanged heterogeneous opacities within the right middle and right upper lobes. Increased consolidation left lower lobe. Upper abdomen: Unchanged bilateral adrenal nodules, 1.6 cm on the right and 2.2 cm left. Unchanged probable cyst within the superior pole of the right kidney. Musculoskeletal: No aggressive or acute appearing osseous lesions. Multiple old healed left-sided rib fractures. IMPRESSION: Worsening consolidation left lower lobe with increased left pleural effusion. Additionally there is unchanged right lower and right middle lobe consolidation. Overall findings are concerning for multi focal pneumonia. Continued follow-up to resolution is recommended. Moderate right pleural effusion. Interval increase in size of small left pleural effusion. Enlarged main pulmonary artery as can be seen with pulmonary arterial hypertension. Electronically Signed   By: Dian Situ  Rosana Hoes M.D.   On: 01/21/2016 13:24   US Abdomen Limited  01/21/2016  CLINICAL DATA:  Leg swelling and right-sided pleural effusion. Evaluation for ascites. EXAM: LIMITED ABDOMEN ULTRASOUND FOR ASCITES TECHNIQUE: Limited ultrasound survey for ascites was performed in all four abdominal quadrants. COMPARISON:  None. FINDINGS: Ultrasound examination of all 4 quadrants of the abdomen did not demonstrate any significant fluid. IMPRESSION: No ascites visualized. Electronically Signed   By: Logan Bores M.D.   On: 01/21/2016 12:55   US Venous Img Lower Bilateral  01/21/2016  CLINICAL DATA:  Patient with bilateral lower extremity swelling for 4 months. EXAM: BILATERAL LOWER EXTREMITY VENOUS DOPPLER ULTRASOUND TECHNIQUE: Gray-scale sonography with graded compression, as well as color Doppler and duplex ultrasound were performed to evaluate the lower extremity deep venous systems from the level of the common femoral vein and including the common femoral, femoral, profunda femoral, popliteal and calf veins including the posterior tibial, peroneal  and gastrocnemius veins when visible. The superficial great saphenous vein was also interrogated. Spectral Doppler was utilized to evaluate flow at rest and with distal augmentation maneuvers in the common femoral, femoral and popliteal veins. COMPARISON:  None. FINDINGS: RIGHT LOWER EXTREMITY Common Femoral Vein: No evidence of thrombus. Normal compressibility, respiratory phasicity and response to augmentation. Saphenofemoral Junction: No evidence of thrombus. Normal compressibility and flow on color Doppler imaging. Profunda Femoral Vein: No evidence of thrombus. Normal compressibility and flow on color Doppler imaging. Femoral Vein: No evidence of thrombus. Normal compressibility, respiratory phasicity and response to augmentation. Popliteal Vein: No evidence of thrombus. Normal compressibility, respiratory phasicity and response to augmentation. Calf Veins: No evidence of thrombus. Normal compressibility and flow on color Doppler imaging. Superficial Great Saphenous Vein: No evidence of thrombus. Normal compressibility and flow on color Doppler imaging. Venous Reflux:  None. Other Findings: Probable 3.0 cm baker cyst within the popliteal fossa. LEFT LOWER EXTREMITY Common Femoral Vein: No evidence of thrombus. Normal compressibility, respiratory phasicity and response to augmentation. Saphenofemoral Junction: No evidence of thrombus. Normal compressibility and flow on color Doppler imaging. Profunda Femoral Vein: No evidence of thrombus. Normal compressibility and flow on color Doppler imaging. Femoral Vein: No evidence of thrombus. Normal compressibility, respiratory phasicity and response to augmentation. Popliteal Vein: No evidence of thrombus. Normal compressibility, respiratory phasicity and response to augmentation. Calf Veins: No evidence of thrombus. Normal compressibility and flow on color Doppler imaging. Superficial Great Saphenous Vein: No evidence of thrombus. Normal compressibility and flow on  color Doppler imaging. Venous Reflux:  None. Other Findings:  None. IMPRESSION: No evidence of deep venous thrombosis. Electronically Signed   By: Lovey Newcomer M.D.   On: 01/21/2016 12:53     January CT above     CT scan 01/18/16     Post thoracentesis CT 2/4  EKG: Independently reviewed.  Assessment/Plan Principal Problem:   Acute on chronic respiratory failure with hypoxia (HCC) Active Problems:   Paroxysmal atrial fibrillation (HCC)   COPD exacerbation (HCC)   Essential hypertension   Recurrent right pleural effusion   HLD (hyperlipidemia)   1. Recurring pleural effusion/Pneumonia -I have reviewed the CT scan of the chest done. There is still some consolidation noted in the right lung raising concern for ongoing infectious vs inflammatory vs neoplastic process. -I would like to continue with abx at this time and repeat CT scan after her course is completed. If there is no resolution then she needs to have a bronchoscopy done. There is no endobronchial lesion noted on the CT.  In addition we should have the cytology results by the coming week. If these are negative I would still favor doing a bronchoscopy with biopsy. She will need to be off of anticoagulants prior to any procedure being done. -continue with Vanc and zosyn and prednisone  2. COPD -she needs to continue with inhalers  -stop smoking     I have personally obtained a history, examined the patient, evaluated laboratory and imaging results, formulated the assessment and plan and placed orders.  The Patient requires high complexity decision making for assessment and support.    Allyne Gee, MD [facility] Pulmonary Critical Care Medicine Sleep Medicine

## 2016-01-23 LAB — GLUCOSE, CAPILLARY
GLUCOSE-CAPILLARY: 96 mg/dL (ref 65–99)
Glucose-Capillary: 119 mg/dL — ABNORMAL HIGH (ref 65–99)
Glucose-Capillary: 163 mg/dL — ABNORMAL HIGH (ref 65–99)
Glucose-Capillary: 163 mg/dL — ABNORMAL HIGH (ref 65–99)

## 2016-01-23 LAB — CYTOLOGY - NON PAP

## 2016-01-23 MED ORDER — LIVING WELL WITH DIABETES BOOK
Freq: Once | Status: AC
  Administered 2016-01-23: 13:00:00
  Filled 2016-01-23: qty 1

## 2016-01-23 MED ORDER — LEVALBUTEROL HCL 0.63 MG/3ML IN NEBU
0.6300 mg | INHALATION_SOLUTION | Freq: Four times a day (QID) | RESPIRATORY_TRACT | Status: DC
  Administered 2016-01-24 (×2): 0.63 mg via RESPIRATORY_TRACT
  Filled 2016-01-23 (×2): qty 3

## 2016-01-23 NOTE — Care Management (Signed)
Patient's 02 is chronic.  Noted to have decline in EF from 45% to 25-30%.  Discussed mobility during progression

## 2016-01-23 NOTE — Progress Notes (Signed)
Patient refuses to wear our CPAP, she states it hurts more than it helps her sleep.

## 2016-01-23 NOTE — Care Management Important Message (Signed)
Important Message  Patient Details  Name: Elaine Decker MRN: [id number] Date of Birth: 04-04-1946   Medicare Important Message Given:  Yes    Juliann Pulse A Margaux Engen 01/23/2016, 3:30 PM

## 2016-01-23 NOTE — Progress Notes (Signed)
[place] [facility] [place] NAME: Elaine Decker    MR#:  [id number]  DATE OF BIRTH:  10-01-1946  SUBJECTIVE:  Patient admitted to the hospital 01/19/16 with shortness of breath and she she's been dealing with intermittently for many months. She also complains of having some lower extremity edema, nonproductive cough denies chest pain fevers chills. Patient underwent thoracentesis which was consistent with transudate, cytology is pending, culture showed no growth so far.  No acute issues overnight, still complains of shortness of breath mainly with activity still complains of nonproductive cough. She does have lower extremity edema, Doppler ultrasound is negative for DVT,  abdominal ultrasound was negative  for ascites . CT of chest revealed multifocal pneumonia and patient was initiated on broad-spectrum antibiotic therapy with Zithromax, vancomycin as well as Zosyn . She is lifting up yellowish looking thick phlegm .  Pulmonologist, Dr. Humphrey Rolls saw patient in consultation and  recommended bronchoscopy likely next week. Cardiologist recommended to continue low-dose furosemide. echocardiogram for  A. fib/atrial flutter was performed today, however, results are still pending, . Poor quality study.  Patient's  oxygenation has improved and now she is on 2-1/2 L of oxygen per nasal cannulas , she is on 3 L at home Feels comfortable today , no significant phlegm production, shortness of breath on exertion. Patient is being planned for bronchoscopy on  Tuesday or Wednesday by Dr. Humphrey Rolls. Echocardiogram done during this admission revealed ejection fraction of 25-30%, down from 45% in the past, attempting to reach Dr. Mariea Stable REVIEW OF SYSTEMS:  CONSTITUTIONAL: No fever, positive fatigue or weakness.  EYES: No blurred or double vision.  EARS, NOSE, AND THROAT: No tinnitus or ear pain.  RESPIRATORY: Positive cough, shortness of breath, denies wheezing or hemoptysis.   CARDIOVASCULAR: No chest pain, orthopnea, positive edema.  GASTROINTESTINAL: No nausea, vomiting, diarrhea or abdominal pain.  GENITOURINARY: No dysuria, hematuria.  ENDOCRINE: No polyuria, nocturia,  HEMATOLOGY: No anemia, easy bruising or bleeding SKIN: No Decker or lesion. MUSCULOSKELETAL: No joint pain or arthritis.   NEUROLOGIC: No tingling, numbness, weakness.  PSYCHIATRY: No anxiety or depression.   DRUG ALLERGIES:  No Known Allergies  VITALS:  Blood pressure 133/87, pulse 88, temperature 97.8 F (36.6 C), temperature source Oral, resp. rate 20, height 5\' 3"  (1.6 m), weight 83.779 kg (184 lb 11.2 oz), SpO2 92 %.  PHYSICAL EXAMINATION:  VITAL SIGNS: Filed Vitals:   01/23/16 1134 01/23/16 1137  BP: 133/87   Pulse: 101 88  Temp: 97.8 F (36.6 C)   Resp: 20    GENERAL:70 y.o.female currently in no acute distress.  HEAD: Normocephalic, atraumatic.  EYES: Pupils equal, round, reactive to light. Extraocular muscles intact. No scleral icterus.  MOUTH: Moist mucosal membrane. Dentition intact. No abscess noted.  EAR, NOSE, THROAT: Clear without exudates. No external lesions.  NECK: Supple. No thyromegaly. No nodules. No JVD.  PULMONARY: Some better breath sounds bilaterally,  intermittent wheezes,  rales And rhonchi. No use of accessory muscles, poor respiratory effort. Poor air entry bilaterally, but better than yesterday. Overall, few crackles were heard at bases CHEST: Nontender to palpation.  CARDIOVASCULAR: S1 and S2. Regular rate and rhythm. No murmurs, rubs, or gallops. 2+ edema. Pedal pulses 2+ bilaterally.  GASTROINTESTINAL: Soft, nontender, nondistended. No masses. Positive bowel sounds. No hepatosplenomegaly.  MUSCULOSKELETAL:2+ lower extremity and ankle as well as feet swelling, , no cyanosis, or clubbing. Range of motion full in all extremities.  NEUROLOGIC: Cranial nerves II through XII  are intact. No gross focal neurological deficits. Sensation intact. Reflexes  intact.  SKIN: No ulceration, lesions, rashes, or cyanosis. Skin warm and dry. Turgor intact.  PSYCHIATRIC: Mood, affect within normal limits. The patient is awake, alert and oriented x 3. Insight, judgment intact.      LABORATORY PANEL:   CBC  Recent Labs Lab 01/20/16 0534  WBC 8.9  HGB 12.0  HCT 38.8  PLT 243   ------------------------------------------------------------------------------------------------------------------  Chemistries   Recent Labs Lab 01/22/16 0504  NA 143  K 3.9  CL 95*  CO2 40*  GLUCOSE 129*  BUN 13  CREATININE 0.60  CALCIUM 9.2   ------------------------------------------------------------------------------------------------------------------  Cardiac Enzymes  Recent Labs Lab 01/19/16 1646  TROPONINI <0.03   ------------------------------------------------------------------------------------------------------------------  RADIOLOGY:  Ct Chest W Contrast  01/21/2016  CLINICAL DATA:  Patient with history of pleural effusion. Shortness of breath and chest pressure. EXAM: CT CHEST WITH CONTRAST TECHNIQUE: Multidetector CT imaging of the chest was performed during intravenous contrast administration. CONTRAST:  63mL OMNIPAQUE IOHEXOL 300 MG/ML  SOLN COMPARISON:  Chest radiograph 01/20/2016; chest CT 01/17/2006 FINDINGS: Mediastinum/Nodes: Visualized thyroid is unremarkable. No enlarged axillary, mediastinal or hilar lymphadenopathy. Multiple sub cm mediastinal lymph nodes are demonstrated. Cardiomegaly. The main pulmonary artery is enlarged measuring up to 3.8 cm. The ascending aorta is dilated measuring 3.9 cm. Lungs/Pleura: Central airways are patent. Centrilobular and paraseptal emphysematous change. Persistent moderate right pleural effusion. Interval increase in size of small left pleural effusion. No pneumothorax. Re- demonstrated consolidation within the right lower lobe. Unchanged heterogeneous opacities within the right middle and right  upper lobes. Increased consolidation left lower lobe. Upper abdomen: Unchanged bilateral adrenal nodules, 1.6 cm on the right and 2.2 cm left. Unchanged probable cyst within the superior pole of the right kidney. Musculoskeletal: No aggressive or acute appearing osseous lesions. Multiple old healed left-sided rib fractures. IMPRESSION: Worsening consolidation left lower lobe with increased left pleural effusion. Additionally there is unchanged right lower and right middle lobe consolidation. Overall findings are concerning for multi focal pneumonia. Continued follow-up to resolution is recommended. Moderate right pleural effusion. Interval increase in size of small left pleural effusion. Enlarged main pulmonary artery as can be seen with pulmonary arterial hypertension. Electronically Signed   By: Lovey Newcomer M.D.   On: 01/21/2016 13:24    EKG:   Orders placed or performed during the hospital encounter of 01/19/16  . ED EKG  . ED EKG  . EKG 12-Lead  . EKG 12-Lead  . EKG    ASSESSMENT AND PLAN:  70 year old Caucasian female history of COPD, systolic congestive heart failure, former tobacco use presenting with shortness of breath admitted to the hospital 01/19/16  *. Acute on chronic respiratory failure with hypoxia and respected hypercapnia, multifactorial  due to pleural effusion, multifocal pneumonia, COPD exacerbation, systolic congestive heart failure. Status post right thoracentesis on the February 3rd  with 1.1 L of fluid removal. Continue patient on broad-spectrum antibiotic therapy as well as Lasix , some improvement clinically , weaning off oxygen as tolerated , now at baseline Cardiology input appreciated, we'll continue diuresis follow urine output renal function, ins and outs and weight, Patient is net negative of approximately 4.3 L since admission  Continue DuoNeb, steroids. , pulmonologist recommends to repeat CT scan of the chest after antibiotic therapy and bronchoscopy the next 1-2  days, holding Pradaxa.   *Multifocal pneumonia , sputum cultures not obtained Continue patient on broad-spectrum antibiotic therapy with  Zosyn and Zithromax , off vancomycin due  to negative. MRSA PCR  *Acute on chronic systolic CHF , echocardiogram revealed a worsening ejection fraction of 25-30%, down from 45%, , continue patient on lisinopril, propranolol and Lasix , patient is net negative of approximately 4 L since admission  And clinically improved  *. Paroxysmal atrial fibrillation: Of Pradaxa due to pending bronchoscopy, rate controlled on propranolol  *. Essential hypertension: Lisinopril, propranolol. Blood pressure is well controlled  *. Hyperlipidemia unspecified statin therapy  *. Venous thromboembolism prophylactic: Therapeutic pradaxa, now of Pradaxa due to pending bronchoscopy  *. Hyperglycemia. hemoglobin A1c 6.3 ,  concerning for prediabetes, initiated diabetic diet and diabetic education  *. Lower extremity swelling, no DVT on  Doppler ultrasound, continue diuresis, following clinically     All the records are reviewed and case discussed with Care Management/Social Workerr. Management plans discussed with the patient, family and they are in agreement.  CODE STATUS: Full  TOTAL TIME TAKING CARE OF THIS PATIENT: 50 minutes.  Treatment plan as well as discharge planning was discussed this patient extensively today, emotional support was provided, time spent approximately 10-15 minutes  Attempting to reach Dr. Saralyn Pilar, cardiologist POSSIBLE D/C IN 2-3 DAYS, DEPENDING ON CLINICAL CONDITION.   Theodoro Grist M.D on 01/23/2016 at 1:03 PM  Between 7am to 6pm - Pager - [phone]  After 6pm: House Pager: - [phone]  [facility]  Office  [phone]  CC: Primary care physician; Glendon Axe, MD

## 2016-01-23 NOTE — Progress Notes (Signed)
Initial Nutrition Assessment     INTERVENTION:  Meals and snacks: Cater to pt preferences. Discussed carb modified diet to help with better blood glucose control.   NUTRITION DIAGNOSIS:   Altered nutrition lab value related to  (steriod medication) as evidenced by  (elevated blood glucose ).    GOAL:   Patient will meet greater than or equal to 90% of their needs    MONITOR:    (Energy intake, Glucose profile)  REASON FOR ASSESSMENT:   Malnutrition Screening Tool    ASSESSMENT:      Pt admitted with pneumonia, afib, s/p thorocentesis  Past Medical History  Diagnosis Date  . COPD (chronic obstructive pulmonary disease) ([place])   . Hypertension   . Asthma   . CHF (congestive heart failure) ([place])   . A-fib ([place])   . CKD (chronic kidney disease)   . HLD (hyperlipidemia)     Current Nutrition: eating 100% of meals  Food/Nutrition-Related History: normal intake   Scheduled Medications:  . atorvastatin  20 mg Oral QHS  . diltiazem  240 mg Oral Daily  . furosemide  40 mg Intravenous Daily  . insulin aspart  0-9 Units Subcutaneous TID WC  . levalbuterol  0.63 mg Nebulization Q4H  . lisinopril  2.5 mg Oral BID  . mometasone-formoterol  2 puff Inhalation BID  . montelukast  10 mg Oral QHS  . predniSONE  50 mg Oral Q breakfast  . propranolol  40 mg Oral BID  . sodium chloride flush  3 mL Intravenous Q12H  . tiotropium  18 mcg Inhalation Daily        Electrolyte/Renal Profile and Glucose Profile:   Recent Labs Lab 01/19/16 1646 01/20/16 0534 01/22/16 0504  NA 141 145 143  K 4.1 4.3 3.9  CL 99* 101 95*  CO2 37* 40* 40*  BUN 14 14 13   CREATININE 0.73 0.77 0.60  CALCIUM 9.3 9.1 9.2  GLUCOSE 113* 131* 129*    Gastrointestinal Profile: Last BM:2/5     Weight Change: stable wt    Diet Order:  Diet Carb Modified Fluid consistency:: Thin; Room service appropriate?: Yes  Skin:   reviewed  Height:   Ht Readings from Last 1 Encounters:   01/19/16 5\' 3"  (1.6 m)    Weight:   Wt Readings from Last 1 Encounters:  01/23/16 184 lb 11.2 oz (83.779 kg)    Ideal Body Weight:     BMI:  Body mass index is 32.73 kg/(m^2).   EDUCATION NEEDS:   No education needs identified at this time  LOW Care Level  Franci Oshana B. Zenia Resides, [place], [place] (pager) Weekend/On-Call pager [phone])

## 2016-01-23 NOTE — Progress Notes (Signed)
Inpatient Diabetes Program Recommendations  AACE/ADA: New Consensus Statement on Inpatient Glycemic Control (2015)  Target Ranges:  Prepandial:   less than 140 mg/dL      Peak postprandial:   less than 180 mg/dL (1-2 hours)      Critically ill patients:  140 - 180 mg/dL   Review of Glycemic Control:  Results for Elaine Decker, Elaine Decker (MRN [id number]) as of 01/23/2016 12:47  Ref. Range 01/22/2016 11:12 01/22/2016 16:22 01/22/2016 21:57 01/23/2016 07:34 01/23/2016 11:35  Glucose-Capillary Latest Ref Range: 65-99 mg/dL 152 (H) 138 (H) 178 (H) 96 119 (H)   Results for Elaine Decker, Elaine Decker (MRN [id number]) as of 01/23/2016 12:47  Ref. Range 01/20/2016 05:34  Hemoglobin A1C Latest Ref Range: 4.0-6.0 % 6.3 (H)   Diabetes history: None- MD notes "Pre-Diabetes" Current orders for Inpatient glycemic control:  Novolog sensitive tid with meals  Referral received regarding "Pre-diabetes".  Spoke with patient briefly.  She states that she has been told several times that her blood sugars go up with steroids.  Discussed results of A1C.  Discussed eliminating sugar from beverages, especially when on steroids.  Also discussed importance of follow-up with PCP.  She states "I ate ice cream last night, which probably was not good with steroids".  Ordered her "Living Well with Diabetes" booklet for her also.    Thanks, Adah Perl, RN, BC-ADM Inpatient Diabetes Coordinator Pager [phone] (8a-5p)

## 2016-01-24 DIAGNOSIS — I5023 Acute on chronic systolic (congestive) heart failure: Secondary | ICD-10-CM

## 2016-01-24 DIAGNOSIS — J189 Pneumonia, unspecified organism: Secondary | ICD-10-CM

## 2016-01-24 DIAGNOSIS — M7989 Other specified soft tissue disorders: Secondary | ICD-10-CM

## 2016-01-24 DIAGNOSIS — J90 Pleural effusion, not elsewhere classified: Secondary | ICD-10-CM

## 2016-01-24 DIAGNOSIS — R7303 Prediabetes: Secondary | ICD-10-CM

## 2016-01-24 DIAGNOSIS — Z9889 Other specified postprocedural states: Secondary | ICD-10-CM

## 2016-01-24 DIAGNOSIS — R531 Weakness: Secondary | ICD-10-CM

## 2016-01-24 LAB — BODY FLUID CULTURE: Culture: NO GROWTH

## 2016-01-24 LAB — PROCALCITONIN

## 2016-01-24 LAB — GLUCOSE, CAPILLARY
GLUCOSE-CAPILLARY: 90 mg/dL (ref 65–99)
Glucose-Capillary: 116 mg/dL — ABNORMAL HIGH (ref 65–99)

## 2016-01-24 MED ORDER — LISINOPRIL 10 MG PO TABS: 10.0000 mg | ORAL_TABLET | Freq: Two times a day (BID) | ORAL | Status: DC

## 2016-01-24 MED ORDER — DOXYCYCLINE HYCLATE 100 MG PO CAPS: 100.0000 mg | ORAL_CAPSULE | Freq: Two times a day (BID) | ORAL | Status: DC

## 2016-01-24 MED ORDER — SPIRONOLACTONE 25 MG PO TABS: 25.0000 mg | ORAL_TABLET | Freq: Every day | ORAL | Status: DC

## 2016-01-24 MED ORDER — PREDNISONE 20 MG PO TABS: 40.0000 mg | ORAL_TABLET | Freq: Every day | ORAL | Status: DC

## 2016-01-24 MED ORDER — FUROSEMIDE 40 MG PO TABS: 40.0000 mg | ORAL_TABLET | Freq: Every day | ORAL | Status: DC

## 2016-01-24 NOTE — Discharge Summary (Signed)
[facility] [place] NAME: Jacqualyn Laudicina    MR#:  [id number]  DATE OF BIRTH:  12/20/1945  DATE OF ADMISSION:  01/19/2016 ADMITTING PHYSICIAN: Lance Coon, MD  DATE OF DISCHARGE: No discharge date for patient encounter.  PRIMARY CARE PHYSICIAN: Singh,Jasmine, MD     ADMISSION DIAGNOSIS:  Shortness of breath [R06.02] Pleural effusion [J90]  DISCHARGE DIAGNOSIS:  Principal Problem:   Acute on chronic respiratory failure with hypoxia and hypercapnia (HCC) Active Problems:   COPD exacerbation (HCC)   Bilateral pneumonia   Pleural effusion, right   Status post thoracentesis   Acute on chronic systolic CHF (congestive heart failure) (HCC)   Recurrent right pleural effusion   Swelling of lower extremity   Generalized weakness   Paroxysmal atrial fibrillation (HCC)   Essential hypertension   HLD (hyperlipidemia)   Prediabetes   SECONDARY DIAGNOSIS:   Past Medical History  Diagnosis Date  . COPD (chronic obstructive pulmonary disease) ([place])   . Hypertension   . Asthma   . CHF (congestive heart failure) ([place])   . A-fib ([place])   . CKD (chronic kidney disease)   . HLD (hyperlipidemia)     .pro HOSPITAL COURSE:   The patient is 70 year old Caucasian female with past medical history significant for history of COPD, hypertension, asthma, congestive heart failure,  fibrillation, CK D stage III who presented to the hospital on 01/19/16 with shortness of breath that she she's been dealing with intermittently for many months.  She also complained of having some lower extremity edema, nonproductive cough,  denied chest pain,  Fevers,  chills. Patient underwent right thoracentesis for pleural effusion which was consistent with transudate, cytology revealed no malignancy, culture showed no growth .  She did have lower extremity edema, Doppler ultrasound was negative for DVT, abdominal ultrasound was negative for ascites . CT of chest  revealed multifocal pneumonia and patient was initiated on broad-spectrum antibiotic therapy with Zithromax, vancomycin as well as Zosyn . Pulmonologist, Dr. Humphrey Rolls saw patient in consultation and recommended bronchoscopy for next Wednesday, February 15. Cardiologist DR Paraschos recommended to continue low-dose furosemide. Echocardiogram revealed ejection fraction of 25-30%, down from 45%, diffuse hypokinesis and moderate MR and TR. Cardiologist did not recommend any further interventions. Patient was diuresed of approximately 5.4 L while in the hospital and progressively improved. Her oxygenation has improved  to her baseline 3 L of oxygen through nasal cannula at home, however on exertion she required 4 L to 5 L of oxygen. Patient was seen by physical therapist and recommended outpatient physical therapy or cardiopulmonary rehabilitation. Discussion by problem  *. Acute on chronic respiratory failure with hypoxia and hypercapnia, multifactorial due to pleural effusion, multifocal pneumonia, COPD exacerbation, systolic congestive heart failure. Status post right thoracentesis on the February 3rd with 1.1 L of fluid removal. Continue patient on doxycycline and Lasix , improved clinically , Continue DuoNeb, steroids , pulmonologist recommends to repeat CT scan of the chest after antibiotic therapy and bronchoscopy is scheduled for February 15, discussed with Dr. Humphrey Rolls, holding Pradaxa starting 10th of February. Patient's oxygen saturations dropped down to 80s on exertion, she will require 4-5 L of oxygen through nasal cannula on exertion, however, she is back to baseline at rest at 2-1/2-3 L  *Multifocal pneumonia , sputum cultures not obtained Continue patient on doxycycline for 10 days, negative MRSA PCR  *Acute on chronic systolic CHF , echocardiogram revealed a worsening ejection fraction of 25-30%, down from 45%, ,  continue patient on lisinopril, propranolol and Lasix , patient is net negative of more  than 5 L  since admission and clinically improved  *. Paroxysmal atrial fibrillation: Resume Pradaxa  till 10th of February, then hold it due to bronchoscopy scheduled for February 15, rate controlled on propranolol  *. Essential hypertension: Lisinopril, propranolol. Blood pressure needs to be followed closely as outpatient since she is on higher doses of lisinopril  * Hyperlipidemia unspecified statin therapy  *. Hyperglycemia. hemoglobin A1c 6.3 , concerning for prediabetes, initiated diabetic diet and diabetic education  *. Lower extremity swelling, no DVT on Doppler ultrasound, continue diuresis, improved clinically     DISCHARGE CONDITIONS:   Stable  CONSULTS OBTAINED:  Treatment Team:  Allyne Gee, MD Isaias Cowman, MD  DRUG ALLERGIES:  No Known Allergies  DISCHARGE MEDICATIONS:   Current Discharge Medication List    START taking these medications   Details  doxycycline (VIBRAMYCIN) 100 MG capsule Take 1 capsule (100 mg total) by mouth 2 (two) times daily. Qty: 20 capsule, Refills: 0    furosemide (LASIX) 40 MG tablet Take 1 tablet (40 mg total) by mouth daily. Qty: 30 tablet, Refills: 5    predniSONE (DELTASONE) 20 MG tablet Take 2 tablets (40 mg total) by mouth daily with breakfast. Qty: 60 tablet, Refills: 0    spironolactone (ALDACTONE) 25 MG tablet Take 1 tablet (25 mg total) by mouth daily. Qty: 30 tablet, Refills: 5      CONTINUE these medications which have CHANGED   Details  lisinopril (PRINIVIL) 10 MG tablet Take 1 tablet (10 mg total) by mouth 2 (two) times daily. Qty: 60 tablet, Refills: 6      CONTINUE these medications which have NOT CHANGED   Details  acetaminophen (TYLENOL) 325 MG tablet Take 325-650 mg by mouth at bedtime.    albuterol (PROVENTIL HFA;VENTOLIN HFA) 108 (90 Base) MCG/ACT inhaler Inhale 2 puffs into the lungs every 4 (four) hours as needed for wheezing or shortness of breath.    atorvastatin (LIPITOR) 20 MG  tablet Take 20 mg by mouth at bedtime.  Refills: 6    cholecalciferol (VITAMIN D) 1000 UNITS tablet Take 1,000 Units by mouth daily.    dabigatran (PRADAXA) 150 MG CAPS capsule Take 150 mg by mouth 2 (two) times daily.    diltiazem (DILACOR XR) 240 MG 24 hr capsule Take 240 mg by mouth daily.    Fluticasone-Salmeterol (ADVAIR) 250-50 MCG/DOSE AEPB Inhale 1 puff into the lungs 2 (two) times daily.    ipratropium-albuterol (DUONEB) 0.5-2.5 (3) MG/3ML SOLN Take 3 mLs by nebulization every 6 (six) hours. Qty: 360 mL, Refills: 1    montelukast (SINGULAIR) 10 MG tablet Take 10 mg by mouth at bedtime.  Refills: 5    Multiple Vitamin (MULTIVITAMIN) tablet Take 1 tablet by mouth daily.    propranolol (INDERAL) 40 MG tablet Take 40 mg by mouth 2 (two) times daily.  Refills: 3    tiotropium (SPIRIVA) 18 MCG inhalation capsule Place 18 mcg into inhaler and inhale daily.         DISCHARGE INSTRUCTIONS:    Patient is to follow-up with primary care physician, pulmonologist  If you experience worsening of your admission symptoms, develop shortness of breath, life threatening emergency, suicidal or homicidal thoughts you must seek medical attention immediately by calling 911 or calling your MD immediately  if symptoms less severe.  You Must read complete instructions/literature along with all the possible adverse reactions/side effects for all the  Medicines you take and that have been prescribed to you. Take any new Medicines after you have completely understood and accept all the possible adverse reactions/side effects.   Please note  You were cared for by a hospitalist during your hospital stay. If you have any questions about your discharge medications or the care you received while you were in the hospital after you are discharged, you can call the unit and asked to speak with the hospitalist on call if the hospitalist that took care of you is not available. Once you are discharged, your  primary care physician will handle any further medical issues. Please note that NO REFILLS for any discharge medications will be authorized once you are discharged, as it is imperative that you return to your primary care physician (or establish a relationship with a primary care physician if you do not have one) for your aftercare needs so that they can reassess your need for medications and monitor your lab values.    Today   CHIEF COMPLAINT:   Chief Complaint  Patient presents with  . Shortness of Breath    HISTORY OF PRESENT ILLNESS:  Kateryna Cuellar  is a 70 y.o. female with a known history of COPD, hypertension, asthma, congestive heart failure,  fibrillation, CK D stage III who presented to the hospital on 01/19/16 with shortness of breath that she she's been dealing with intermittently for many months.  She also complained of having some lower extremity edema, nonproductive cough,  denied chest pain,  Fevers,  chills. Patient underwent right thoracentesis for pleural effusion which was consistent with transudate, cytology revealed no malignancy, culture showed no growth .  She did have lower extremity edema, Doppler ultrasound was negative for DVT, abdominal ultrasound was negative for ascites . CT of chest revealed multifocal pneumonia and patient was initiated on broad-spectrum antibiotic therapy with Zithromax, vancomycin as well as Zosyn . Pulmonologist, Dr. Humphrey Rolls saw patient in consultation and recommended bronchoscopy for next Wednesday, February 15. Cardiologist DR Paraschos recommended to continue low-dose furosemide. Echocardiogram revealed ejection fraction of 25-30%, down from 45%, diffuse hypokinesis and moderate MR and TR. Cardiologist did not recommend any further interventions. Patient was diuresed of approximately 5.4 L while in the hospital and progressively improved. Her oxygenation has improved  to her baseline 3 L of oxygen through nasal cannula at home, however on exertion  she required 4 L to 5 L of oxygen. Patient was seen by physical therapist and recommended outpatient physical therapy or cardiopulmonary rehabilitation. Discussion by problem  *. Acute on chronic respiratory failure with hypoxia and hypercapnia, multifactorial due to pleural effusion, multifocal pneumonia, COPD exacerbation, systolic congestive heart failure. Status post right thoracentesis on the February 3rd with 1.1 L of fluid removal. Continue patient on doxycycline and Lasix , improved clinically , Continue DuoNeb, steroids , pulmonologist recommends to repeat CT scan of the chest after antibiotic therapy and bronchoscopy is scheduled for February 15, discussed with Dr. Humphrey Rolls, holding Pradaxa starting 10th of February. Patient's oxygen saturations dropped down to 80s on exertion, she will require 4-5 L of oxygen through nasal cannula on exertion, however, she is back to baseline at rest at 2-1/2-3 L  *Multifocal pneumonia , sputum cultures not obtained Continue patient on doxycycline for 10 days, negative MRSA PCR  *Acute on chronic systolic CHF , echocardiogram revealed a worsening ejection fraction of 25-30%, down from 45%, , continue patient on lisinopril, propranolol and Lasix , patient is net negative of more than 5 L  since admission and clinically improved  *. Paroxysmal atrial fibrillation: Resume Pradaxa  till 10th of February, then hold it due to bronchoscopy scheduled for February 15, rate controlled on propranolol  *. Essential hypertension: Lisinopril, propranolol. Blood pressure needs to be followed closely as outpatient since she is on higher doses of lisinopril  * Hyperlipidemia unspecified statin therapy  *. Hyperglycemia. hemoglobin A1c 6.3 , concerning for prediabetes, initiated diabetic diet and diabetic education  *. Lower extremity swelling, no DVT on Doppler ultrasound, continue diuresis, improved clinically     VITAL SIGNS:  Blood pressure 132/74, pulse 70,  temperature 97.6 F (36.4 C), temperature source Oral, resp. rate 18, height 5\' 3"  (1.6 m), weight 82.509 kg (181 lb 14.4 oz), SpO2 93 %.  I/O:   Intake/Output Summary (Last 24 hours) at 01/24/16 1439 Last data filed at 01/24/16 1340  Gross per 24 hour  Intake   1680 ml  Output   3075 ml  Net  -1395 ml    PHYSICAL EXAMINATION:  GENERAL:  70 y.o.-year-old patient lying in the bed with no acute distress.  EYES: Pupils equal, round, reactive to light and accommodation. No scleral icterus. Extraocular muscles intact.  HEENT: Head atraumatic, normocephalic. Oropharynx and nasopharynx clear.  NECK:  Supple, no jugular venous distention. No thyroid enlargement, no tenderness.  LUNGS: Normal breath sounds bilaterally, no wheezing, rales,rhonchi , scattered crepitations noted in bilateral lung fields, mostly on  right base. No use of accessory muscles of respiration.  CARDIOVASCULAR: S1, S2 normal. No murmurs, rubs, or gallops.  ABDOMEN: Soft, non-tender, non-distended. Bowel sounds present. No organomegaly or mass.  EXTREMITIES: No pedal edema, cyanosis, or clubbing.  NEUROLOGIC: Cranial nerves II through XII are intact. Muscle strength 5/5 in all extremities. Sensation intact. Gait not checked.  PSYCHIATRIC: The patient is alert and oriented x 3.  SKIN: No obvious rash, lesion, or ulcer.   DATA REVIEW:   CBC  Recent Labs Lab 01/20/16 0534  WBC 8.9  HGB 12.0  HCT 38.8  PLT 243    Chemistries   Recent Labs Lab 01/22/16 0504  NA 143  K 3.9  CL 95*  CO2 40*  GLUCOSE 129*  BUN 13  CREATININE 0.60  CALCIUM 9.2    Cardiac Enzymes  Recent Labs Lab 01/19/16 1646  TROPONINI <0.03    Microbiology Results  Results for orders placed or performed during the hospital encounter of 01/19/16  Body fluid culture     Status: None   Collection Time: 01/20/16 10:10 AM  Result Value Ref Range Status   Specimen Description CYTO PLEU  Final   Special Requests NONE  Final   Gram  Stain FEW WBC SEEN NO ORGANISMS SEEN   Final   Culture No growth aerobically or anaerobically.  Final   Report Status 01/24/2016 FINAL  Final  Surgical pcr screen     Status: None   Collection Time: 01/22/16  9:50 AM  Result Value Ref Range Status   MRSA, PCR NEGATIVE NEGATIVE Final   Staphylococcus aureus NEGATIVE NEGATIVE Final    Comment:        The Xpert SA Assay (FDA approved for NASAL specimens in patients over 96 years of age), is one component of a comprehensive surveillance program.  Test performance has been validated by [facility] for patients greater than or equal to 1 year old. It is not intended to diagnose infection nor to guide or monitor treatment.     RADIOLOGY:  No results found.  EKG:   Orders placed or performed during the hospital encounter of 01/19/16  . ED EKG  . ED EKG  . EKG 12-Lead  . EKG 12-Lead  . EKG      Management plans discussed with the patient, family and they are in agreement.  CODE STATUS:     Code Status Orders        Start     Ordered   01/19/16 2114  Full code   Continuous     01/19/16 2113    Code Status History    Date Active Date Inactive Code Status Order ID Comments User Context   11/28/2015 11:41 AM 11/30/2015  5:51 PM Full Code [id number]  Loletha Grayer, MD ED      TOTAL TIME TAKING CARE OF THIS PATIENT: 40 minutes.    Theodoro Grist M.D on 01/24/2016 at 2:39 PM  Between 7am to 6pm - Pager - [phone]  After 6pm go to [email or web address] - password [email or web address] [place] [facility]  Office  [phone]  CC: Primary care physician; Glendon Axe, MD

## 2016-01-24 NOTE — Evaluation (Signed)
Physical Therapy Evaluation Patient Details Name: Elaine Decker MRN: [id number] DOB: March 22, 1946 Today's Date: 01/24/2016   History of Present Illness  Elaine Decker is a 70 y.o. female who presents with increasing shortness of breath. Patient has a known history of COPD, and recently has been having recurrent right-sided pleural effusion. She was treated multiple times with rounds of antibiotics for presumed pneumonia. She states that she had some fluid draining from her right lung in December, and that studies were sent on this but she does not know what the results were what studies were sent for analysis. Her pulmonologist is Dr. Humphrey Rolls. She comes in today due to worsening of her chronic shortness of breath. She states she is on oxygen at home, but despite this she has been getting more dyspneic. Initial O2 sats by EMS were in the 60s. Here in the ED lab workup was largely benign. CT chest showed moderate right-sided pleural effusion which is noticeably increased from prior imaging. Suggests some possible left lower lobe infiltrate, the patient denies any strong infectious symptoms. She is also in A. fib/A flutter. Oxygen saturation improved significantly with increase in O2 via nasal cannula  Clinical Impression  Patient reports she has had a decline in health over the last few years, and today presents with significant deconditioning relative to her expected baseline. 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 She initially ambulates roughly 150' and desaturates to 81-85% on 3L of O2 requiring roughly a minute to return to 87-88%. She was educated to pace herself with frequent breaks, however given her significant deconditioning, pool therapy or some other form of cardiopulmonary conditioning is indicated to increase her mobility. Skilled acute PT services are indicated to address her deconditioning.     Follow Up  Recommendations Outpatient PT (Or cardiopulmonary rehab)    Equipment Recommendations       Recommendations for Other Services       Precautions / Restrictions Precautions Precautions: None Restrictions Weight Bearing Restrictions: No      Mobility  Bed Mobility Overal bed mobility: Independent             General bed mobility comments: No deficits noted.   Transfers Overall transfer level: Independent               General transfer comment: No deficits noted.   Ambulation/Gait Ambulation/Gait assistance: Independent Ambulation Distance (Feet): 360 Feet Assistive device: None Gait Pattern/deviations: WFL(Within Functional Limits)     General Gait Details: No balance deficits. Patient educated to ambulate in 40-50' incremements as when she initially ambulated ~ 150' she desaturated to 81% for roughly 1 minute on 3L. With shorter bursts she maintained roughly 87-88%.   SaO2 on room air at rest =  SaO2 on room air while ambulating =  SaO2 on 3 liters of O2 while ambulating = 81-88%%   Stairs            Wheelchair Mobility    Modified Rankin (Stroke Patients Only)       Balance Overall balance assessment: No apparent balance deficits (not formally assessed)                                           Pertinent Vitals/Pain Pain Assessment:  (No mention of pain in this session.)    Home Living Family/patient expects to  be discharged to:: Private residence Living Arrangements: Alone Available Help at Discharge: Family;Available PRN/intermittently Type of Home: House Home Access: Stairs to enter   [employer] of Steps: 2          Prior Function Level of Independence: Independent         Comments: Patient still works full time, 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, newspaper        Extremity/Trunk Assessment   Upper Extremity Assessment: Overall WFL for tasks assessed           Lower Extremity Assessment: Overall  WFL for tasks assessed         Communication   Communication: No difficulties  Cognition Arousal/Alertness: Awake/alert Behavior During Therapy: WFL for tasks assessed/performed Overall Cognitive Status: Within Functional Limits for tasks assessed                      General Comments      Exercises        Assessment/Plan    PT Assessment Patient needs continued PT services  PT Diagnosis Other (comment) (deconditioning)   PT Problem List Decreased activity tolerance;Cardiopulmonary status limiting activity  PT Treatment Interventions Gait training;Stair training;Therapeutic exercise;Therapeutic activities   PT Goals (Current goals can be found in the Care Plan section) Acute Rehab PT Goals Patient Stated Goal: To return to work  PT Goal Formulation: With patient Time For Goal Achievement: 02/07/16 Potential to Achieve Goals: Good    Frequency Min 2X/week   Barriers to discharge        Co-evaluation               End of Session Equipment Utilized During Treatment: Gait belt;Oxygen Activity Tolerance: Patient tolerated treatment well Patient left: in bed;with call bell/phone within reach;with family/visitor present Nurse Communication: Mobility status (O2 sats)         Time: [id number] PT Time Calculation (min) (ACUTE ONLY): 17 min   Charges:   PT Evaluation $PT Eval Moderate Complexity: 1 Procedure     PT G Codes:       Kerman Passey, PT, DPT    01/24/2016, 1:07 PM

## 2016-01-24 NOTE — Care Management (Signed)
Patient for discharge today and is declining home health.  Patient says she is going back to work as soon as possible.  Says she has a portable  concentrator that she takes to work.  Patient is going to require increased liter flow with exertion.  She has her portable tank in her room for transport home but tank does not appear to be operating correctly.  had cadiopulmonary assess and confirmed that the tank is not working. Husband will bring another tank.  Faxed changed in oxygen order to [facility] Patient

## 2016-01-24 NOTE — Progress Notes (Signed)
Patient refuses bed alarm at this time, edutation reinforced. Family friend at bedside. Elaine Decker

## 2016-01-24 NOTE — Clinical Documentation Improvement (Signed)
Internal Medicine  Please update your documentation within the medical record to reflect your response to this query. Thank you  Can the diagnosis of CKD be further specified as noted per H&P?   CKD Stage I - GFR greater than or equal to 90  CKD Stage II - GFR 60-89  CKD Stage III - GFR 30-59  CKD Stage IV - GFR 15-29  CKD Stage V - GFR < 15  ESRD (End Stage Renal Disease)  Other condition  Unable to clinically determine  Supporting Information: : (risk factors, signs and symptoms, diagnostics, treatment) 01/19/16 H&P..."CKD (chronic kidney disease)"...  Please exercise your independent, professional judgment when responding. A specific answer is not anticipated or expected.  Thank You, Ermelinda Das, RN, BSN, [place] Certified Clinical Documentation Specialist [place]: Health Information Management [phone]

## 2016-01-24 NOTE — Progress Notes (Signed)
Patient d/c'd home. Education provided, no questions at this time. Patient to be picked up by family friend. Telemetry removed. Sophiagrace [place] R [place]  

## 2016-01-24 NOTE — Progress Notes (Signed)
SATURATION QUALIFICATIONS: (This note is used to comply with regulatory documentation for home oxygen)  Patient Saturations on 2 Liters of oxyegn = 85% (patient already on chronic oxygen at home)   Patient Saturations on Room Air while Ambulating = NA   Patient Saturations on 4 Liters of oxygen while Ambulating = 89-90%  Please briefly explain why patient needs home oxygen: Patient unable to tolerate ambulation on 2L of oxygen, increased to 3L and still 85%. Per Dr. Ether Griffins place patient on 4L and ambulate, sats ranged from 88-90%.  Wilnette Kales

## 2016-02-01 ENCOUNTER — Encounter: Payer: Self-pay | Admitting: *Deleted

## 2016-02-01 ENCOUNTER — Encounter
Admission: RE | Disposition: A | Discharge status: Home / Self Care | Payer: Self-pay | Source: Ambulatory Visit | Attending: Internal Medicine

## 2016-02-01 ENCOUNTER — Ambulatory Visit
Admission: RE | Admit: 2016-02-01 | Discharge: 2016-02-01 | Disposition: A | Discharge status: Home / Self Care | Payer: BLUE CROSS/BLUE SHIELD | Source: Ambulatory Visit | Attending: Internal Medicine | Admitting: Internal Medicine

## 2016-02-01 DIAGNOSIS — J45909 Unspecified asthma, uncomplicated: Secondary | ICD-10-CM | POA: Insufficient documentation

## 2016-02-01 DIAGNOSIS — Z8701 Personal history of pneumonia (recurrent): Secondary | ICD-10-CM | POA: Insufficient documentation

## 2016-02-01 DIAGNOSIS — Z7901 Long term (current) use of anticoagulants: Secondary | ICD-10-CM | POA: Diagnosis not present

## 2016-02-01 DIAGNOSIS — Z801 Family history of malignant neoplasm of trachea, bronchus and lung: Secondary | ICD-10-CM | POA: Diagnosis not present

## 2016-02-01 DIAGNOSIS — Z79899 Other long term (current) drug therapy: Secondary | ICD-10-CM | POA: Diagnosis not present

## 2016-02-01 DIAGNOSIS — J449 Chronic obstructive pulmonary disease, unspecified: Secondary | ICD-10-CM | POA: Diagnosis not present

## 2016-02-01 DIAGNOSIS — E785 Hyperlipidemia, unspecified: Secondary | ICD-10-CM | POA: Diagnosis not present

## 2016-02-01 DIAGNOSIS — I4891 Unspecified atrial fibrillation: Secondary | ICD-10-CM | POA: Diagnosis not present

## 2016-02-01 DIAGNOSIS — Z7951 Long term (current) use of inhaled steroids: Secondary | ICD-10-CM | POA: Insufficient documentation

## 2016-02-01 DIAGNOSIS — Z87891 Personal history of nicotine dependence: Secondary | ICD-10-CM | POA: Insufficient documentation

## 2016-02-01 DIAGNOSIS — I129 Hypertensive chronic kidney disease with stage 1 through stage 4 chronic kidney disease, or unspecified chronic kidney disease: Secondary | ICD-10-CM | POA: Insufficient documentation

## 2016-02-01 DIAGNOSIS — Z8249 Family history of ischemic heart disease and other diseases of the circulatory system: Secondary | ICD-10-CM | POA: Insufficient documentation

## 2016-02-01 DIAGNOSIS — R918 Other nonspecific abnormal finding of lung field: Secondary | ICD-10-CM | POA: Insufficient documentation

## 2016-02-01 DIAGNOSIS — R911 Solitary pulmonary nodule: Secondary | ICD-10-CM | POA: Diagnosis present

## 2016-02-01 DIAGNOSIS — J9819 Other pulmonary collapse: Secondary | ICD-10-CM | POA: Diagnosis present

## 2016-02-01 DIAGNOSIS — J9811 Atelectasis: Secondary | ICD-10-CM | POA: Diagnosis not present

## 2016-02-01 DIAGNOSIS — I509 Heart failure, unspecified: Secondary | ICD-10-CM | POA: Diagnosis not present

## 2016-02-01 DIAGNOSIS — N189 Chronic kidney disease, unspecified: Secondary | ICD-10-CM | POA: Insufficient documentation

## 2016-02-01 HISTORY — PX: FLEXIBLE BRONCHOSCOPY: SHX5094

## 2016-02-01 SURGERY — BRONCHOSCOPY, FLEXIBLE
Anesthesia: Moderate Sedation

## 2016-02-01 MED ORDER — BUTAMBEN-TETRACAINE-BENZOCAINE 2-2-14 % EX AERO: 1.0000 | INHALATION_SPRAY | Freq: Once | CUTANEOUS | Status: DC

## 2016-02-01 MED ORDER — FENTANYL CITRATE (PF) 100 MCG/2ML IJ SOLN
INTRAMUSCULAR | Status: AC
  Filled 2016-02-01: qty 4

## 2016-02-01 MED ORDER — SODIUM CHLORIDE 0.9 % IV SOLN
INTRAVENOUS | Status: DC
  Administered 2016-02-01 (×2): via INTRAVENOUS

## 2016-02-01 MED ORDER — MIDAZOLAM HCL 5 MG/5ML IJ SOLN
INTRAMUSCULAR | Status: AC
  Filled 2016-02-01: qty 10

## 2016-02-01 MED ORDER — PHENYLEPHRINE HCL 0.25 % NA SOLN: 1.0000 | Freq: Four times a day (QID) | NASAL | Status: DC | PRN

## 2016-02-01 MED ORDER — LIDOCAINE HCL 2 % EX GEL: 1.0000 "application " | Freq: Once | CUTANEOUS | Status: DC

## 2016-02-01 MED ORDER — MIDAZOLAM HCL 5 MG/5ML IJ SOLN
INTRAMUSCULAR | Status: AC | PRN
  Administered 2016-02-01 (×2): 2 mg via INTRAVENOUS

## 2016-02-01 MED ORDER — FENTANYL CITRATE (PF) 100 MCG/2ML IJ SOLN
INTRAMUSCULAR | Status: AC | PRN
  Administered 2016-02-01: 50 ug via INTRAVENOUS

## 2016-02-01 NOTE — Discharge Instructions (Signed)
Bronchospasm, Adult A bronchospasm is when the tubes that carry air in and out of your lungs (airways) spasm or tighten. During a bronchospasm it is hard to breathe. This is because the airways get smaller. A bronchospasm can be triggered by:  Allergies. These may be to animals, pollen, food, or mold.  Infection. This is a common cause of bronchospasm.  Exercise.  Irritants. These include pollution, cigarette smoke, strong odors, aerosol sprays, and paint fumes.  Weather changes.  Stress.  Being emotional. HOME CARE   Always have a plan for getting help. Know when to call your doctor and local emergency services (911 in the [place] Know where you can get emergency care.  Only take medicines as told by your doctor.  If you were prescribed an inhaler or nebulizer machine, ask your doctor how to use it correctly. Always use a spacer with your inhaler if you were given one.  Stay calm during an attack. Try to relax and breathe more slowly.  Control your home environment:  Change your heating and air conditioning filter at least once a month.  Limit your use of fireplaces and wood stoves.  Do not  smoke. Do not  allow smoking in your home.  Avoid perfumes and fragrances.  Get rid of pests (such as roaches and mice) and their droppings.  Throw away plants if you see mold on them.  Keep your house clean and dust free.  Replace carpet with wood, tile, or vinyl flooring. Carpet can trap dander and dust.  Use allergy-proof pillows, mattress covers, and box spring covers.  Wash bed sheets and blankets every week in hot water. Dry them in a dryer.  Use blankets that are made of polyester or cotton.  Wash hands frequently. GET HELP IF:  You have muscle aches.  You have chest pain.  The thick spit you spit or cough up (sputum) changes from clear or white to yellow, green, gray, or bloody.  The thick spit you spit or cough up gets thicker.  There are problems that may be  related to the medicine you are given such as:  A rash.  Itching.  Swelling.  Trouble breathing. GET HELP RIGHT AWAY IF:  You feel you cannot breathe or catch your breath.  You cannot stop coughing.  Your treatment is not helping you breathe better.  You have very bad chest pain. MAKE SURE YOU:   Understand these instructions.  Will watch your condition.  Will get help right away if you are not doing well or get worse.   This information is not intended to replace advice given to you by your health care provider. Make sure you discuss any questions you have with your health care provider.   Document Released: 09/30/2009 Document Revised: 12/24/2014 Document Reviewed: 05/26/2013 Elsevier Interactive Patient Education 2016 [employer]  

## 2016-02-01 NOTE — Progress Notes (Signed)
Pt clinicallly stable post bronchoscopy, vss,  In recovery, awake and alert, family member present, no complaints, Dr Humphrey Rolls speaking with patient with questions answered, pt already with return appointment next week to discuss findings of procedure.

## 2016-02-01 NOTE — Op Note (Signed)
[facility] Patient Name: Elaine Decker Procedure Date: 02/01/2016 8:14 AM MRN: [id number] Account #: [id number] Date of Birth: Mar 24, 1946 Admit Type: Outpatient Age: 70 Room: [facility] PROCEDURE RM 01 Gender: Female Note Status: Finalized Attending MD: Allyne Gee , MD Procedure:         Bronchoscopy Indications:       Bilateral atelectasis, Bilateral infiltrate, Unresolving                     bilateral infiltrates Providers:         Allyne Gee, MD Referring MD:       Medicines:         Midazolam 4 mg IV, Fentanyl 50 mcg IV Complications:     No immediate complications Procedure:         Pre-Anesthesia Assessment:                    - A History and Physical has been performed. The patient's                     medications, allergies and sensitivities have been                     reviewed.                    - The risks and benefits of the procedure and the sedation                     options and risks were discussed with the patient. All                     questions were answered and informed consent was obtained.                    - Pre-procedure physical examination revealed no                     contraindications to sedation.                    - ASA Grade Assessment: III - A patient with severe                     systemic disease.                    - After reviewing the risks and benefits, the patient was                     deemed in satisfactory condition to undergo the procedure.                    - The anesthesia plan was to use moderate                     sedation/analgesia.                    - Immediately prior to administration of medications, the                     patient was re-assessed for adequacy to receive sedatives.                    - The heart rate, respiratory rate, oxygen saturations,  blood pressure, adequacy of pulmonary ventilation, and                     response to care were monitored throughout the  procedure.                    - The physical status of the patient was re-assessed after                     the procedure.                    After obtaining informed consent, the bronchoscope was                     passed under direct vision. Throughout the procedure, the                     patient's blood pressure, pulse, and oxygen saturations                     were monitored continuously. the Bronchoscope Olympus                     [id number] was introduced through the right                     nostril and advanced to the tracheobronchial tree of both                     lungs. The procedure was accomplished without difficulty.                     The patient tolerated the procedure well. The total                     duration of the procedure was 20 minutes. Findings:      Right Lung Abnormalities: Copious, mucoid, tenacious, thick secretions       were found throughout the right tracheobronchial tree. They were not       obstructing the airway. Erythema was found throughout the right       tracheobronchial tree.      Bronchoalveolar lavage was performed in the RML lateral segment (B4) of       the lung and sent for cell count, bacterial culture, viral smears &       culture, and fungal & AFB analysis and cytology. 100 mL of fluid were       instilled. 80 mL were returned. There were no mucoid plugs in the return       fluid.      Verification of patient identification for the specimen was done by the       physician and nurse using the patient's name, birth date and medical       record number.      Brushings of an area of infiltration were obtained in the lateral       segment of the right middle lobe with a cytology brush and sent for cell       count, bacterial culture, viral smears & culture, and fungal & AFB       analysis and cytology. Two samples were obtained.      Verification of patient identification for the specimen was done by the       physician and  nurse using the patient's  name, birth date and medical       record number. Impression:        - Bilateral atelectasis                    - Bilateral infiltrate                    - Unresolving bilateral infiltrates                    - Copious, mucoid, tenacious, thick secretions were found                     throughout the tracheobronchial tree.                    - Erythema was present throughout the tracheobronchial                     tree.                    - Bronchoalveolar lavage was performed.                    - Brushings were obtained. Recommendation:    - Await BAL, brushing, culture, cytology and washing                     results. Devona Konig, MD Allyne Gee, MD 02/01/2016 8:56:14 AM This report has been signed electronically. Number of Addenda: 0 Note Initiated On: 02/01/2016 8:14 AM      [facility]

## 2016-02-02 LAB — CYTOLOGY - NON PAP

## 2016-02-05 LAB — CULTURE, RESPIRATORY W GRAM STAIN: Special Requests: NORMAL

## 2016-02-05 LAB — CULTURE, RESPIRATORY

## 2016-02-07 NOTE — H&P (Signed)
Pulmonary Critical Care  Initial Consult Note   Elaine Decker [id number] DOB: 05-Mar-1946 DOA: 02/01/2016   PCP: Glendon Axe, MD   Chief Complaint: Pneumonia  HPI: Elaine Decker is a 70 y.o. female with ongoing pneumonia. Patient was recently in the hospital with pneumonia. Since this is recurrent it was decided to proceed with bronchoscopy. She is currently stable and in no distress   Review of Systems:  ROS performed and is unremarkable other than noted in HPI  Past Medical History  Diagnosis Date  . COPD (chronic obstructive pulmonary disease) ([place])   . Hypertension   . Asthma   . CHF (congestive heart failure) ([place])   . A-fib ([place])   . CKD (chronic kidney disease)   . HLD (hyperlipidemia)    Past Surgical History  Procedure Laterality Date  . Surgery on the neck    . Flexible bronchoscopy N/A 02/01/2016    Procedure: FLEXIBLE BRONCHOSCOPY;  Surgeon: Allyne Gee, MD;  Location: [facility] ORS;  Service: Pulmonary;  Laterality: N/A;   Social History:  reports that she quit smoking about 2 months ago. She does not have any smokeless tobacco history on file. She reports that she does not drink alcohol or use illicit drugs.  No Known Allergies  Family History  Problem Relation Age of Onset  . CAD Mother   . Lung cancer Father     Prior to Admission medications   Medication Sig Start Date End Date Taking? Authorizing Provider  acetaminophen (TYLENOL) 325 MG tablet Take 325-650 mg by mouth at bedtime.   Yes Historical Provider, MD  albuterol (PROVENTIL HFA;VENTOLIN HFA) 108 (90 Base) MCG/ACT inhaler Inhale 2 puffs into the lungs every 4 (four) hours as needed for wheezing or shortness of breath.   Yes Historical Provider, MD  atorvastatin (LIPITOR) 20 MG tablet Take 20 mg by mouth at bedtime.    Yes Historical Provider, MD  cholecalciferol (VITAMIN D) 1000 UNITS tablet Take 1,000 Units by mouth daily.   Yes Historical Provider, MD  dabigatran (PRADAXA) 150 MG CAPS  capsule Take 150 mg by mouth 2 (two) times daily.   Yes Historical Provider, MD  diltiazem (DILACOR XR) 240 MG 24 hr capsule Take 240 mg by mouth daily.   Yes Historical Provider, MD  doxycycline (VIBRAMYCIN) 100 MG capsule Take 1 capsule (100 mg total) by mouth 2 (two) times daily. 01/24/16  Yes Theodoro Grist, MD  Fluticasone-Salmeterol (ADVAIR) 250-50 MCG/DOSE AEPB Inhale 1 puff into the lungs 2 (two) times daily.   Yes Historical Provider, MD  furosemide (LASIX) 40 MG tablet Take 1 tablet (40 mg total) by mouth daily. 01/24/16  Yes Theodoro Grist, MD  ipratropium-albuterol (DUONEB) 0.5-2.5 (3) MG/3ML SOLN Take 3 mLs by nebulization every 6 (six) hours. Patient taking differently: Take 3 mLs by nebulization every 6 (six) hours as needed (for shortness of breath).  11/30/15  Yes Fritzi Mandes, MD  lisinopril (PRINIVIL) 10 MG tablet Take 1 tablet (10 mg total) by mouth 2 (two) times daily. 01/24/16  Yes Theodoro Grist, MD  montelukast (SINGULAIR) 10 MG tablet Take 10 mg by mouth at bedtime.    Yes Historical Provider, MD  Multiple Vitamin (MULTIVITAMIN) tablet Take 1 tablet by mouth daily.   Yes Historical Provider, MD  predniSONE (DELTASONE) 20 MG tablet Take 2 tablets (40 mg total) by mouth daily with breakfast. 01/24/16  Yes Theodoro Grist, MD  propranolol (INDERAL) 40 MG tablet Take 40 mg by mouth 2 (two) times daily.  Yes Historical Provider, MD  spironolactone (ALDACTONE) 25 MG tablet Take 1 tablet (25 mg total) by mouth daily. 01/24/16  Yes Theodoro Grist, MD  tiotropium (SPIRIVA) 18 MCG inhalation capsule Place 18 mcg into inhaler and inhale daily.   Yes Historical Provider, MD   Physical Exam: Filed Vitals:   02/01/16 0930 02/01/16 0938 02/01/16 0945 02/01/16 0946  BP: 114/78 114/78 110/70 110/70  Pulse: 47 70 83 70  Temp:      TempSrc:      Resp: 15 22 18 18   Height:      Weight:      SpO2: 93% 91% 92% 95%    Wt Readings from Last 3 Encounters:  02/01/16 82.101 kg (181 lb)  01/24/16 82.509  kg (181 lb 14.4 oz)  11/28/15 79.833 kg (176 lb)    General:  Appears calm and comfortable Eyes: PERRL, normal lids, irises & conjunctiva ENT: grossly normal hearing, lips & tongue Neck: no LAD, masses or thyromegaly Cardiovascular: RRR, no m/r/g. No LE edema. Respiratory: CTA bilaterally, no w/r/r. Normal respiratory effort. Abdomen: soft, nontender Skin: no rash or induration seen on limited exam Musculoskeletal: grossly normal tone BUE/BLE Psychiatric: grossly normal mood and affect Neurologic: grossly non-focal.          Labs on Admission:  Basic Metabolic Panel: No results for input(s): NA, K, CL, CO2, GLUCOSE, BUN, CREATININE, CALCIUM, MG, PHOS in the last 168 hours. Liver Function Tests: No results for input(s): AST, ALT, ALKPHOS, BILITOT, PROT, ALBUMIN in the last 168 hours. No results for input(s): LIPASE, AMYLASE in the last 168 hours. No results for input(s): AMMONIA in the last 168 hours. CBC: No results for input(s): WBC, NEUTROABS, HGB, HCT, MCV, PLT in the last 168 hours. Cardiac Enzymes: No results for input(s): CKTOTAL, CKMB, CKMBINDEX, TROPONINI in the last 168 hours.  BNP (last 3 results)  Recent Labs  01/19/16 1646  BNP 1048.0*    ProBNP (last 3 results) No results for input(s): PROBNP in the last 8760 hours.  CBG: No results for input(s): GLUCAP in the last 168 hours.  Radiological Exams on Admission: No results found.  EKG: Independently reviewed.  Assessment/Plan Active Problems:   * No active hospital problems. *   1. Pneumonia -will proceed with bronchoscopy        I have personally obtained a history, examined the patient, evaluated laboratory and imaging results, formulated the assessment and plan and placed orders.  The Patient requires high complexity decision making for assessment and support.    Allyne Gee, MD [facility] Pulmonary Critical Care Medicine Sleep Medicine

## 2016-02-09 ENCOUNTER — Other Ambulatory Visit: Payer: Self-pay | Admitting: Vascular Surgery

## 2016-02-09 DIAGNOSIS — I712 Thoracic aortic aneurysm, without rupture, unspecified: Secondary | ICD-10-CM

## 2016-02-20 ENCOUNTER — Other Ambulatory Visit: Payer: Self-pay | Admitting: Internal Medicine

## 2016-02-20 DIAGNOSIS — J151 Pneumonia due to Pseudomonas: Secondary | ICD-10-CM

## 2016-02-22 LAB — FUNGUS CULTURE W SMEAR: SPECIAL REQUESTS: NORMAL

## 2016-02-28 ENCOUNTER — Ambulatory Visit
Admission: RE | Admit: 2016-02-28 | Discharge: 2016-02-28 | Disposition: A | Discharge status: Home / Self Care | Payer: BLUE CROSS/BLUE SHIELD | Source: Ambulatory Visit | Attending: Internal Medicine | Admitting: Internal Medicine

## 2016-02-28 DIAGNOSIS — J151 Pneumonia due to Pseudomonas: Secondary | ICD-10-CM

## 2016-02-28 DIAGNOSIS — I517 Cardiomegaly: Secondary | ICD-10-CM | POA: Insufficient documentation

## 2016-02-28 DIAGNOSIS — Z8701 Personal history of pneumonia (recurrent): Secondary | ICD-10-CM | POA: Diagnosis present

## 2016-02-28 DIAGNOSIS — J439 Emphysema, unspecified: Secondary | ICD-10-CM | POA: Diagnosis not present

## 2016-02-28 DIAGNOSIS — J90 Pleural effusion, not elsewhere classified: Secondary | ICD-10-CM | POA: Diagnosis not present

## 2016-02-28 DIAGNOSIS — Z09 Encounter for follow-up examination after completed treatment for conditions other than malignant neoplasm: Secondary | ICD-10-CM | POA: Diagnosis present

## 2016-02-28 MED ORDER — IOHEXOL 300 MG/ML  SOLN
75.0000 mL | Freq: Once | INTRAMUSCULAR | Status: AC | PRN
Start: 2016-02-28 — End: 2016-02-28
  Administered 2016-02-28: 75 mL via INTRAVENOUS

## 2016-03-16 LAB — ACID FAST SMEAR+CULTURE W/RFLX (ARMC ONLY)
ACID FAST SMEAR - AFSCU2: NEGATIVE
Acid Fast Culture: NEGATIVE

## 2016-03-18 ENCOUNTER — Inpatient Hospital Stay
Admission: EM | Admit: 2016-03-18 | Discharge: 2016-03-26 | DRG: 291 | Disposition: A | Discharge status: Home / Self Care | Payer: Medicare Other | Attending: Internal Medicine | Admitting: Internal Medicine

## 2016-03-18 ENCOUNTER — Emergency Department: Payer: Medicare Other

## 2016-03-18 DIAGNOSIS — Z8249 Family history of ischemic heart disease and other diseases of the circulatory system: Secondary | ICD-10-CM

## 2016-03-18 DIAGNOSIS — R0602 Shortness of breath: Secondary | ICD-10-CM | POA: Diagnosis not present

## 2016-03-18 DIAGNOSIS — J90 Pleural effusion, not elsewhere classified: Secondary | ICD-10-CM | POA: Insufficient documentation

## 2016-03-18 DIAGNOSIS — I130 Hypertensive heart and chronic kidney disease with heart failure and stage 1 through stage 4 chronic kidney disease, or unspecified chronic kidney disease: Secondary | ICD-10-CM | POA: Diagnosis not present

## 2016-03-18 DIAGNOSIS — I480 Paroxysmal atrial fibrillation: Secondary | ICD-10-CM | POA: Diagnosis present

## 2016-03-18 DIAGNOSIS — J449 Chronic obstructive pulmonary disease, unspecified: Secondary | ICD-10-CM | POA: Diagnosis present

## 2016-03-18 DIAGNOSIS — I5023 Acute on chronic systolic (congestive) heart failure: Secondary | ICD-10-CM | POA: Diagnosis present

## 2016-03-18 DIAGNOSIS — E785 Hyperlipidemia, unspecified: Secondary | ICD-10-CM | POA: Diagnosis present

## 2016-03-18 DIAGNOSIS — I429 Cardiomyopathy, unspecified: Secondary | ICD-10-CM | POA: Diagnosis present

## 2016-03-18 DIAGNOSIS — I4892 Unspecified atrial flutter: Secondary | ICD-10-CM | POA: Diagnosis present

## 2016-03-18 DIAGNOSIS — Z801 Family history of malignant neoplasm of trachea, bronchus and lung: Secondary | ICD-10-CM

## 2016-03-18 DIAGNOSIS — Z87891 Personal history of nicotine dependence: Secondary | ICD-10-CM

## 2016-03-18 DIAGNOSIS — Z79899 Other long term (current) drug therapy: Secondary | ICD-10-CM

## 2016-03-18 DIAGNOSIS — Z7902 Long term (current) use of antithrombotics/antiplatelets: Secondary | ICD-10-CM

## 2016-03-18 DIAGNOSIS — Z9981 Dependence on supplemental oxygen: Secondary | ICD-10-CM

## 2016-03-18 DIAGNOSIS — N189 Chronic kidney disease, unspecified: Secondary | ICD-10-CM | POA: Diagnosis present

## 2016-03-18 DIAGNOSIS — Z9889 Other specified postprocedural states: Secondary | ICD-10-CM

## 2016-03-18 DIAGNOSIS — J9601 Acute respiratory failure with hypoxia: Secondary | ICD-10-CM | POA: Diagnosis present

## 2016-03-18 DIAGNOSIS — J45909 Unspecified asthma, uncomplicated: Secondary | ICD-10-CM | POA: Diagnosis present

## 2016-03-18 LAB — CBC WITH DIFFERENTIAL/PLATELET
BASOS ABS: 0.1 10*3/uL (ref 0–0.1)
BASOS PCT: 1 %
EOS ABS: 0 10*3/uL (ref 0–0.7)
Eosinophils Relative: 0 %
HEMATOCRIT: 39.5 % (ref 35.0–47.0)
HEMOGLOBIN: 12.6 g/dL (ref 12.0–16.0)
Lymphocytes Relative: 8 %
Lymphs Abs: 0.9 10*3/uL — ABNORMAL LOW (ref 1.0–3.6)
MCH: 29.6 pg (ref 26.0–34.0)
MCHC: 31.8 g/dL — ABNORMAL LOW (ref 32.0–36.0)
MCV: 92.9 fL (ref 80.0–100.0)
Monocytes Absolute: 0.7 10*3/uL (ref 0.2–0.9)
Monocytes Relative: 6 %
NEUTROS ABS: 9 10*3/uL — AB (ref 1.4–6.5)
NEUTROS PCT: 85 %
Platelets: 232 10*3/uL (ref 150–440)
RBC: 4.25 MIL/uL (ref 3.80–5.20)
RDW: 15.7 % — ABNORMAL HIGH (ref 11.5–14.5)
WBC: 10.7 10*3/uL (ref 3.6–11.0)

## 2016-03-18 LAB — COMPREHENSIVE METABOLIC PANEL
ALT: 22 U/L (ref 14–54)
AST: 21 U/L (ref 15–41)
Albumin: 3.3 g/dL — ABNORMAL LOW (ref 3.5–5.0)
Alkaline Phosphatase: 68 U/L (ref 38–126)
Anion gap: 6 (ref 5–15)
BUN: 18 mg/dL (ref 6–20)
CHLORIDE: 100 mmol/L — AB (ref 101–111)
CO2: 36 mmol/L — ABNORMAL HIGH (ref 22–32)
Calcium: 9 mg/dL (ref 8.9–10.3)
Creatinine, Ser: 0.69 mg/dL (ref 0.44–1.00)
GFR calc Af Amer: >60 mL/min (ref >60)
Glucose, Bld: 133 mg/dL — ABNORMAL HIGH (ref 65–99)
Potassium: 3.8 mmol/L (ref 3.5–5.1)
Sodium: 142 mmol/L (ref 135–145)
TOTAL PROTEIN: 6.3 g/dL — AB (ref 6.5–8.1)
Total Bilirubin: 0.9 mg/dL (ref 0.3–1.2)

## 2016-03-18 LAB — TSH: TSH: 0.394 u[IU]/mL (ref 0.350–4.500)

## 2016-03-18 LAB — PROTIME-INR
INR: 1.16
PROTHROMBIN TIME: 15 s (ref 11.4–15.0)

## 2016-03-18 LAB — BRAIN NATRIURETIC PEPTIDE: B NATRIURETIC PEPTIDE 5: 876 pg/mL — AB (ref 0.0–100.0)

## 2016-03-18 LAB — TROPONIN I: TROPONIN I: 0.05 ng/mL — AB (ref <0.031)

## 2016-03-18 MED ORDER — FUROSEMIDE 10 MG/ML IJ SOLN
80.0000 mg | Freq: Once | INTRAMUSCULAR | Status: AC
  Administered 2016-03-18: 80 mg via INTRAVENOUS
  Filled 2016-03-18: qty 8

## 2016-03-18 MED ORDER — ASPIRIN 81 MG PO CHEW
324.0000 mg | CHEWABLE_TABLET | Freq: Once | ORAL | Status: AC
  Administered 2016-03-18: 324 mg via ORAL
  Filled 2016-03-18: qty 4

## 2016-03-18 MED ORDER — DILTIAZEM LOAD VIA INFUSION
20.0000 mg | Freq: Once | INTRAVENOUS | Status: AC
  Administered 2016-03-18: 20 mg via INTRAVENOUS
  Filled 2016-03-18: qty 20

## 2016-03-18 MED ORDER — DILTIAZEM HCL 100 MG IV SOLR
5.0000 mg/h | Freq: Once | INTRAVENOUS | Status: AC
  Administered 2016-03-18: 5 mg/h via INTRAVENOUS
  Filled 2016-03-18: qty 100

## 2016-03-18 MED ORDER — DILTIAZEM HCL 25 MG/5ML IV SOLN
INTRAVENOUS | Status: AC
  Filled 2016-03-18: qty 5

## 2016-03-18 NOTE — ED Notes (Signed)
Pt's initial O2 sat was 53% RA, sensor was switched to forehead and O2 changed to 92%. Pt's O2 sat did not correlate to symptoms, so sensor was placed on ear. Pt normally on 2% O2 at home.  Pt reports hx of COPD with recurrent flares since September. Pt c/o of SOB beginning yesterday at 2pm. Pt also states: "I have fluid on my heart". Pt denies pain.   Pt current O2 sat 85 on 2L [place]. Pt switched to 2L, and pt O2 sat increased 93%.

## 2016-03-18 NOTE — ED Provider Notes (Addendum)
[facility] Emergency Department Provider Note  ____________________________________________   I have reviewed the triage vital signs and the nursing notes.   HISTORY  Chief Complaint Shortness of Breath    HPI Elaine Decker is a 70 y.o. female with a history of paroxysmal atrial fibrillation, she's had pneumonia in the past, pleural effusions and she is on Lasix, but she states she is not taking it. Patient is on Pradaxa. The patient has a history of cardiomyopathy and she cannot tell when she is in A. fib. She presents today with shortness of breath over the last 2 days. No fever or cough. Just feels generally short of breath. The patient states that she also has a history of COPD. Upon arrival, patient was noted to be hypoxic and brought back to the emergency room. She is not having any chest pain. She feels much better on oxygen. She is not on home oxygen. The patient states that she has had increased lower showed a swelling. She does not that she is noncompliant with her Lasix because of makes her urinate too much and has not been taking it regularly for some time. Specifically over the last 5 days she is only taking it as she chooses to a lower doses than is her indicated dose.She does endorse orthopnea increasing lower extremity swelling. Dyspnea on exertion.    Past Medical History  Diagnosis Date  . COPD (chronic obstructive pulmonary disease) ([place])   . Hypertension   . Asthma   . CHF (congestive heart failure) ([place])   . A-fib ([place])   . CKD (chronic kidney disease)   . HLD (hyperlipidemia)     Patient Active Problem List   Diagnosis Date Noted  . Bilateral pneumonia 01/24/2016  . Pleural effusion, right 01/24/2016  . Status post thoracentesis 01/24/2016  . Acute on chronic systolic CHF (congestive heart failure) ([place]) 01/24/2016  . Prediabetes 01/24/2016  . Swelling of lower extremity 01/24/2016  . Generalized weakness 01/24/2016  . Paroxysmal  atrial fibrillation ([place]) 01/19/2016  . COPD exacerbation ([place]) 01/19/2016  . Essential hypertension 01/19/2016  . Acute on chronic respiratory failure with hypoxia and hypercapnia ([place]) 01/19/2016  . Recurrent right pleural effusion 01/19/2016  . HLD (hyperlipidemia) 01/19/2016  . Rapid atrial fibrillation ([place]) 11/28/2015    Past Surgical History  Procedure Laterality Date  . Surgery on the neck    . Flexible bronchoscopy N/A 02/01/2016    Procedure: FLEXIBLE BRONCHOSCOPY;  Surgeon: Allyne Gee, MD;  Location: [facility] ORS;  Service: Pulmonary;  Laterality: N/A;    Current Outpatient Rx  Name  Route  Sig  Dispense  Refill  . acetaminophen (TYLENOL) 325 MG tablet   Oral   Take 325-650 mg by mouth at bedtime.         Marland Kitchen albuterol (PROVENTIL HFA;VENTOLIN HFA) 108 (90 Base) MCG/ACT inhaler   Inhalation   Inhale 2 puffs into the lungs every 4 (four) hours as needed for wheezing or shortness of breath.         Marland Kitchen atorvastatin (LIPITOR) 20 MG tablet   Oral   Take 20 mg by mouth at bedtime.       6   . cholecalciferol (VITAMIN D) 1000 UNITS tablet   Oral   Take 1,000 Units by mouth daily.         . dabigatran (PRADAXA) 150 MG CAPS capsule   Oral   Take 150 mg by mouth 2 (two) times daily.         Marland Kitchen  diltiazem (DILACOR XR) 240 MG 24 hr capsule   Oral   Take 240 mg by mouth daily.         Marland Kitchen doxycycline (VIBRAMYCIN) 100 MG capsule   Oral   Take 1 capsule (100 mg total) by mouth 2 (two) times daily.   20 capsule   0   . Fluticasone-Salmeterol (ADVAIR) 250-50 MCG/DOSE AEPB   Inhalation   Inhale 1 puff into the lungs 2 (two) times daily.         . furosemide (LASIX) 40 MG tablet   Oral   Take 1 tablet (40 mg total) by mouth daily.   30 tablet   5   . ipratropium-albuterol (DUONEB) 0.5-2.5 (3) MG/3ML SOLN   Nebulization   Take 3 mLs by nebulization every 6 (six) hours. Patient taking differently: Take 3 mLs by nebulization every 6 (six) hours as needed (for  shortness of breath).    360 mL   1   . lisinopril (PRINIVIL) 10 MG tablet   Oral   Take 1 tablet (10 mg total) by mouth 2 (two) times daily.   60 tablet   6   . montelukast (SINGULAIR) 10 MG tablet   Oral   Take 10 mg by mouth at bedtime.       5   . Multiple Vitamin (MULTIVITAMIN) tablet   Oral   Take 1 tablet by mouth daily.         . predniSONE (DELTASONE) 20 MG tablet   Oral   Take 2 tablets (40 mg total) by mouth daily with breakfast.   60 tablet   0   . propranolol (INDERAL) 40 MG tablet   Oral   Take 40 mg by mouth 2 (two) times daily.       3   . spironolactone (ALDACTONE) 25 MG tablet   Oral   Take 1 tablet (25 mg total) by mouth daily.   30 tablet   5   . tiotropium (SPIRIVA) 18 MCG inhalation capsule   Inhalation   Place 18 mcg into inhaler and inhale daily.           Allergies Review of patient's allergies indicates no known allergies.  Family History  Problem Relation Age of Onset  . CAD Mother   . Lung cancer Father     Social History Social History  Substance Use Topics  . Smoking status: Former Smoker -- 2.00 packs/day for 50 years    Quit date: 11/27/2015  . Smokeless tobacco: None  . Alcohol Use: No    Review of Systems Constitutional: No fever/chills Eyes: No visual changes. ENT: No sore throat. No stiff neck no neck pain Cardiovascular: Denies chest pain. Respiratory: Positive shortness of breath. Gastrointestinal:   no vomiting.  No diarrhea.  No constipation. Genitourinary: Negative for dysuria. Musculoskeletal: Positive lower extremity swelling Skin: Negative for rash. Neurological: Negative for headaches, focal weakness or numbness. 10-point ROS otherwise negative.  ____________________________________________   PHYSICAL EXAM:  VITAL SIGNS: ED Triage Vitals  Enc Vitals Group     BP 03/18/16 2100 143/88 mmHg     Pulse Rate 03/18/16 2100 98     Resp 03/18/16 2110 26     Temp --      Temp src --       SpO2 03/18/16 2100 53 %     Weight --      Height --      Head Cir --      Peak Flow --  Pain Score --      Pain Loc --      Pain Edu? --      Excl. in [place]? --     Constitutional: Alert and oriented. Nontoxic in appearance Eyes: Conjunctivae are normal. PERRL. EOMI. Head: Atraumatic. Nose: No congestion/rhinnorhea. Mouth/Throat: Mucous membranes are moist.  Oropharynx non-erythematous. Neck: No stridor.   Nontender with no meningismus Cardiovascular: Normal rate, regular rhythm. Grossly normal heart sounds.  Good peripheral circulation. Respiratory: No significant increased work of breathing however there is diminished breath sounds in the bibasilar region especially on the right, occasional rale  Abdominal: Soft and nontender. No distention. No guarding no rebound Back:  There is no focal tenderness or step off there is no midline tenderness there are no lesions noted. there is no CVA tenderness Musculoskeletal: No lower extremity tenderness. No joint effusions, no DVT signs strong distal pulses 2+ bilateral symmetric pitting edema Neurologic:  Normal speech and language. No gross focal neurologic deficits are appreciated.  Skin:  Skin is warm, dry and intact. No rash noted. Psychiatric: Mood and affect are normal. Speech and behavior are normal.  ____________________________________________   LABS (all labs ordered are listed, but only abnormal results are displayed)  Labs Reviewed  BRAIN NATRIURETIC PEPTIDE - Abnormal; Notable for the following:    B Natriuretic Peptide 876.0 (*)    All other components within normal limits  COMPREHENSIVE METABOLIC PANEL - Abnormal; Notable for the following:    Chloride 100 (*)    CO2 36 (*)    Glucose, Bld 133 (*)    Total Protein 6.3 (*)    Albumin 3.3 (*)    All other components within normal limits  TROPONIN I - Abnormal; Notable for the following:    Troponin I 0.05 (*)    All other components within normal limits  CBC WITH  DIFFERENTIAL/PLATELET - Abnormal; Notable for the following:    MCHC 31.8 (*)    RDW 15.7 (*)    Neutro Abs 9.0 (*)    Lymphs Abs 0.9 (*)    All other components within normal limits  PROTIME-INR  TSH   ____________________________________________  EKG  I personally interpreted any EKGs ordered by me or triage Atrial flutter rate 148 no acute ischemic changes ____________________________________________  RADIOLOGY  I reviewed any imaging ordered by me or triage that were performed during my shift and, if possible, patient and/or family made aware of any abnormal findings. ____________________________________________   PROCEDURES  Procedure(s) performed: None  Critical Care performed: CRITICAL CARE Performed by: Schuyler Amor   Total critical care time: 40 minutes  Critical care time was exclusive of separately billable procedures and treating other patients.  Critical care was necessary to treat or prevent imminent or life-threatening deterioration.  Critical care was time spent personally by me on the following activities: development of treatment plan with patient and/or surrogate as well as nursing, discussions with consultants, evaluation of patient's response to treatment, examination of patient, obtaining history from patient or surrogate, ordering and performing treatments and interventions, ordering and review of laboratory studies, ordering and review of radiographic studies, pulse oximetry and re-evaluation of patient's condition.   ____________________________________________   INITIAL IMPRESSION / ASSESSMENT AND PLAN / ED COURSE  Pertinent labs & imaging results that were available during my care of the patient were reviewed by me and considered in my medical decision making (see chart for details).  This certainly could be continued to reading to her increased dyspnea as  well as her noncompliance with her Lasix. We are diuresis and her, I did talk to Dr.  Josefa Half , who does not have any other recommendations at this time. He does advise continuing the Cardizem drip. He does suggest possibly giving her 25 of metoprolol every 6 by mouth, he is aware that the patient is ongoing atrial flutter. We will do so. On oxygen, patient is doing better. She does have a pleural effusion, this time clinically there is no indication suspect this is an acute pneumonia white count is reassuring, afebrile, and we will defer antibiotics pending further resolution of her obvious cardiogenic symptoms. Discussed with the hospitalist and they agree with plan and will continue care. ____________________________________________   FINAL CLINICAL IMPRESSION(S) / ED DIAGNOSES  Final diagnoses:  SOB (shortness of breath)      This chart was dictated using voice recognition software.  Despite best efforts to proofread,  errors can occur which can change meaning.     Schuyler Amor, MD 03/18/16 [place], MD 03/18/16 [phone]

## 2016-03-19 ENCOUNTER — Encounter: Payer: Self-pay | Admitting: Internal Medicine

## 2016-03-19 DIAGNOSIS — Z9981 Dependence on supplemental oxygen: Secondary | ICD-10-CM | POA: Diagnosis not present

## 2016-03-19 DIAGNOSIS — Z801 Family history of malignant neoplasm of trachea, bronchus and lung: Secondary | ICD-10-CM | POA: Diagnosis not present

## 2016-03-19 DIAGNOSIS — I130 Hypertensive heart and chronic kidney disease with heart failure and stage 1 through stage 4 chronic kidney disease, or unspecified chronic kidney disease: Secondary | ICD-10-CM | POA: Diagnosis present

## 2016-03-19 DIAGNOSIS — J9601 Acute respiratory failure with hypoxia: Secondary | ICD-10-CM | POA: Diagnosis present

## 2016-03-19 DIAGNOSIS — Z87891 Personal history of nicotine dependence: Secondary | ICD-10-CM | POA: Diagnosis not present

## 2016-03-19 DIAGNOSIS — J90 Pleural effusion, not elsewhere classified: Secondary | ICD-10-CM | POA: Diagnosis not present

## 2016-03-19 DIAGNOSIS — E785 Hyperlipidemia, unspecified: Secondary | ICD-10-CM | POA: Diagnosis present

## 2016-03-19 DIAGNOSIS — I480 Paroxysmal atrial fibrillation: Secondary | ICD-10-CM | POA: Diagnosis present

## 2016-03-19 DIAGNOSIS — I5023 Acute on chronic systolic (congestive) heart failure: Secondary | ICD-10-CM | POA: Diagnosis present

## 2016-03-19 DIAGNOSIS — R0602 Shortness of breath: Secondary | ICD-10-CM | POA: Diagnosis present

## 2016-03-19 DIAGNOSIS — N189 Chronic kidney disease, unspecified: Secondary | ICD-10-CM | POA: Diagnosis present

## 2016-03-19 DIAGNOSIS — Z8249 Family history of ischemic heart disease and other diseases of the circulatory system: Secondary | ICD-10-CM | POA: Diagnosis not present

## 2016-03-19 DIAGNOSIS — J45909 Unspecified asthma, uncomplicated: Secondary | ICD-10-CM | POA: Diagnosis present

## 2016-03-19 DIAGNOSIS — Z79899 Other long term (current) drug therapy: Secondary | ICD-10-CM | POA: Diagnosis not present

## 2016-03-19 DIAGNOSIS — J449 Chronic obstructive pulmonary disease, unspecified: Secondary | ICD-10-CM | POA: Diagnosis present

## 2016-03-19 DIAGNOSIS — I4892 Unspecified atrial flutter: Secondary | ICD-10-CM | POA: Diagnosis present

## 2016-03-19 DIAGNOSIS — I429 Cardiomyopathy, unspecified: Secondary | ICD-10-CM | POA: Diagnosis present

## 2016-03-19 DIAGNOSIS — Z7902 Long term (current) use of antithrombotics/antiplatelets: Secondary | ICD-10-CM | POA: Diagnosis not present

## 2016-03-19 LAB — TROPONIN I
TROPONIN I: 0.07 ng/mL — AB (ref <0.031)
TROPONIN I: 0.05 ng/mL — AB (ref <0.031)
Troponin I: 0.09 ng/mL — ABNORMAL HIGH (ref <0.031)

## 2016-03-19 LAB — MRSA PCR SCREENING: MRSA BY PCR: NEGATIVE

## 2016-03-19 LAB — CBC
HCT: 40 % (ref 35.0–47.0)
HEMOGLOBIN: 13 g/dL (ref 12.0–16.0)
MCH: 29.6 pg (ref 26.0–34.0)
MCHC: 32.4 g/dL (ref 32.0–36.0)
MCV: 91.5 fL (ref 80.0–100.0)
Platelets: 229 10*3/uL (ref 150–440)
RBC: 4.37 MIL/uL (ref 3.80–5.20)
RDW: 15.7 % — ABNORMAL HIGH (ref 11.5–14.5)
WBC: 10.6 10*3/uL (ref 3.6–11.0)

## 2016-03-19 LAB — LIPID PANEL
CHOLESTEROL: 142 mg/dL (ref 0–200)
HDL: 46 mg/dL (ref >40)
LDL Cholesterol: 80 mg/dL (ref 0–99)
Total CHOL/HDL Ratio: 3.1 RATIO
Triglycerides: 81 mg/dL (ref <150)
VLDL: 16 mg/dL (ref 0–40)

## 2016-03-19 LAB — BASIC METABOLIC PANEL
ANION GAP: 3 — AB (ref 5–15)
BUN: 14 mg/dL (ref 6–20)
CHLORIDE: 93 mmol/L — AB (ref 101–111)
CO2: 47 mmol/L — AB (ref 22–32)
Calcium: 8.8 mg/dL — ABNORMAL LOW (ref 8.9–10.3)
Creatinine, Ser: 0.59 mg/dL (ref 0.44–1.00)
GFR calc non Af Amer: >60 mL/min (ref >60)
Glucose, Bld: 103 mg/dL — ABNORMAL HIGH (ref 65–99)
POTASSIUM: 3 mmol/L — AB (ref 3.5–5.1)
Sodium: 143 mmol/L (ref 135–145)

## 2016-03-19 LAB — C DIFFICILE QUICK SCREEN W PCR REFLEX
C Diff antigen: POSITIVE — AB
C Diff interpretation: POSITIVE
C Diff toxin: POSITIVE — AB

## 2016-03-19 LAB — MAGNESIUM: MAGNESIUM: 1.5 mg/dL — AB (ref 1.7–2.4)

## 2016-03-19 MED ORDER — SODIUM CHLORIDE 0.9% FLUSH
3.0000 mL | Freq: Two times a day (BID) | INTRAVENOUS | Status: DC
  Administered 2016-03-19 – 2016-03-26 (×14): 3 mL via INTRAVENOUS

## 2016-03-19 MED ORDER — POTASSIUM CHLORIDE CRYS ER 20 MEQ PO TBCR
20.0000 meq | EXTENDED_RELEASE_TABLET | Freq: Two times a day (BID) | ORAL | Status: DC
  Administered 2016-03-19 – 2016-03-25 (×14): 20 meq via ORAL
  Filled 2016-03-19 (×14): qty 1

## 2016-03-19 MED ORDER — METOPROLOL TARTRATE 25 MG PO TABS
25.0000 mg | ORAL_TABLET | Freq: Four times a day (QID) | ORAL | Status: DC
  Administered 2016-03-19 – 2016-03-23 (×18): 25 mg via ORAL
  Filled 2016-03-19 (×18): qty 1

## 2016-03-19 MED ORDER — VANCOMYCIN 50 MG/ML ORAL SOLUTION
125.0000 mg | Freq: Four times a day (QID) | ORAL | Status: DC
  Administered 2016-03-20 – 2016-03-26 (×27): 125 mg via ORAL
  Filled 2016-03-19 (×10): qty 2.5
  Filled 2016-03-19: qty 3
  Filled 2016-03-19 (×21): qty 2.5

## 2016-03-19 MED ORDER — DILTIAZEM HCL 100 MG IV SOLR
5.0000 mg/h | INTRAVENOUS | Status: DC
  Administered 2016-03-19 (×2): 10 mg/h via INTRAVENOUS
  Filled 2016-03-19 (×2): qty 100

## 2016-03-19 MED ORDER — FUROSEMIDE 10 MG/ML IJ SOLN: 60.0000 mg | Freq: Two times a day (BID) | INTRAMUSCULAR | Status: DC

## 2016-03-19 MED ORDER — MONTELUKAST SODIUM 10 MG PO TABS
10.0000 mg | ORAL_TABLET | Freq: Every day | ORAL | Status: DC
  Administered 2016-03-19 – 2016-03-25 (×7): 10 mg via ORAL
  Filled 2016-03-19 (×7): qty 1

## 2016-03-19 MED ORDER — DABIGATRAN ETEXILATE MESYLATE 150 MG PO CAPS
150.0000 mg | ORAL_CAPSULE | Freq: Two times a day (BID) | ORAL | Status: DC
  Administered 2016-03-19 (×2): 150 mg via ORAL
  Filled 2016-03-19 (×3): qty 1

## 2016-03-19 MED ORDER — SODIUM CHLORIDE 0.9% FLUSH
3.0000 mL | INTRAVENOUS | Status: DC | PRN
  Administered 2016-03-20 – 2016-03-21 (×2): 3 mL via INTRAVENOUS
  Filled 2016-03-19 (×2): qty 3

## 2016-03-19 MED ORDER — ATORVASTATIN CALCIUM 20 MG PO TABS
20.0000 mg | ORAL_TABLET | Freq: Every day | ORAL | Status: DC
  Administered 2016-03-19 – 2016-03-25 (×7): 20 mg via ORAL
  Filled 2016-03-19 (×7): qty 1

## 2016-03-19 MED ORDER — LISINOPRIL 10 MG PO TABS
10.0000 mg | ORAL_TABLET | Freq: Two times a day (BID) | ORAL | Status: DC
  Administered 2016-03-19 – 2016-03-25 (×15): 10 mg via ORAL
  Filled 2016-03-19 (×15): qty 1

## 2016-03-19 MED ORDER — IPRATROPIUM-ALBUTEROL 0.5-2.5 (3) MG/3ML IN SOLN
3.0000 mL | Freq: Four times a day (QID) | RESPIRATORY_TRACT | Status: DC
  Administered 2016-03-19 – 2016-03-20 (×7): 3 mL via RESPIRATORY_TRACT
  Filled 2016-03-19 (×8): qty 3

## 2016-03-19 MED ORDER — ACETAMINOPHEN 325 MG PO TABS: 650.0000 mg | ORAL_TABLET | ORAL | Status: DC | PRN

## 2016-03-19 MED ORDER — ONDANSETRON HCL 4 MG/2ML IJ SOLN: 4.0000 mg | Freq: Four times a day (QID) | INTRAMUSCULAR | Status: DC | PRN

## 2016-03-19 MED ORDER — ASPIRIN EC 81 MG PO TBEC
81.0000 mg | DELAYED_RELEASE_TABLET | Freq: Every day | ORAL | Status: DC
  Administered 2016-03-19 – 2016-03-26 (×8): 81 mg via ORAL
  Filled 2016-03-19 (×8): qty 1

## 2016-03-19 MED ORDER — VITAMIN D 1000 UNITS PO TABS
1000.0000 [IU] | ORAL_TABLET | Freq: Every day | ORAL | Status: DC
Start: 2016-03-19 — End: 2016-03-26
  Administered 2016-03-19 – 2016-03-26 (×8): 1000 [IU] via ORAL
  Filled 2016-03-19 (×8): qty 1

## 2016-03-19 MED ORDER — ADULT MULTIVITAMIN W/MINERALS CH
1.0000 | ORAL_TABLET | Freq: Every day | ORAL | Status: DC
  Administered 2016-03-19 – 2016-03-26 (×8): 1 via ORAL
  Filled 2016-03-19 (×8): qty 1

## 2016-03-19 MED ORDER — SODIUM CHLORIDE 0.9 % IV SOLN: 250.0000 mL | INTRAVENOUS | Status: DC | PRN

## 2016-03-19 MED ORDER — DABIGATRAN ETEXILATE MESYLATE 150 MG PO CAPS: 150.0000 mg | ORAL_CAPSULE | Freq: Two times a day (BID) | ORAL | Status: DC

## 2016-03-19 MED ORDER — MOMETASONE FURO-FORMOTEROL FUM 200-5 MCG/ACT IN AERO
2.0000 | INHALATION_SPRAY | Freq: Two times a day (BID) | RESPIRATORY_TRACT | Status: DC
  Administered 2016-03-19 – 2016-03-26 (×14): 2 via RESPIRATORY_TRACT
  Filled 2016-03-19 (×2): qty 8.8

## 2016-03-19 MED ORDER — FUROSEMIDE 10 MG/ML IJ SOLN
20.0000 mg | Freq: Two times a day (BID) | INTRAMUSCULAR | Status: DC
  Administered 2016-03-19 – 2016-03-21 (×4): 20 mg via INTRAVENOUS
  Filled 2016-03-19 (×4): qty 2

## 2016-03-19 MED ORDER — SPIRONOLACTONE 25 MG PO TABS
25.0000 mg | ORAL_TABLET | Freq: Every day | ORAL | Status: DC
  Administered 2016-03-19 – 2016-03-25 (×7): 25 mg via ORAL
  Filled 2016-03-19 (×7): qty 1

## 2016-03-19 MED ORDER — ALBUTEROL SULFATE (2.5 MG/3ML) 0.083% IN NEBU: 3.0000 mL | INHALATION_SOLUTION | RESPIRATORY_TRACT | Status: DC | PRN

## 2016-03-19 MED ORDER — DILTIAZEM HCL 30 MG PO TABS
30.0000 mg | ORAL_TABLET | Freq: Four times a day (QID) | ORAL | Status: DC
  Administered 2016-03-19 – 2016-03-20 (×3): 30 mg via ORAL
  Filled 2016-03-19 (×4): qty 1

## 2016-03-19 NOTE — Progress Notes (Signed)
Patient transferred from CCU to 2A [id number]. Oriented to room, Ascom phones, call bell and staff. Bed in low position. Fall safety plan reviewed, yellow non-skid socks in place, bed alarm on. Full assessment to Epic; skin assessed with Georga Hacking, RN. Telemetry box verified with tele clerk and Elonda Husky., NT: [phone]. Will continue to monitor.   Per Dr. Anselm Jungling, foley catheter can be removed.

## 2016-03-19 NOTE — Progress Notes (Signed)
Patient cdiff culture is positive, new orders given.

## 2016-03-19 NOTE — Progress Notes (Signed)
Cardizem gtt discontinued, pt in a flutter, HR 100 and stable. Second loose, mucous-y & seedy stool noted since transfer to 2A. Prime Doc paged and r/o cdiff protocol iniated per MD.

## 2016-03-19 NOTE — Progress Notes (Signed)
Diltiazem gtt titrated down to 5mg /hr. PO cardizem given. Per Dr. Ubaldo Glassing, drip to be d/c'd, will do so around 1845-1900. Patient remains a flutter HR 100s with no fluctuation.

## 2016-03-19 NOTE — Progress Notes (Signed)
[place] [facility] [place] NAME: Elaine Decker    MR#:  [id number]  DATE OF BIRTH:  [birth year]  SUBJECTIVE:  CHIEF COMPLAINT:   Chief Complaint  Patient presents with  . Shortness of Breath    Came with SOB, found to have A fib, started on cardizem drip.   She also have repeated Pleural effusions in past few months, required thoracentesis in past.    Currently HR controlled with cardizem,and she denies any complains.  REVIEW OF SYSTEMS:  CONSTITUTIONAL: No fever, fatigue or weakness.  EYES: No blurred or double vision.  EARS, NOSE, AND THROAT: No tinnitus or ear pain.  RESPIRATORY: No cough, positive  shortness of breath, no wheezing or hemoptysis.  CARDIOVASCULAR: No chest pain, orthopnea, edema.  GASTROINTESTINAL: No nausea, vomiting, diarrhea or abdominal pain.  GENITOURINARY: No dysuria, hematuria.  ENDOCRINE: No polyuria, nocturia,  HEMATOLOGY: No anemia, easy bruising or bleeding SKIN: No rash or lesion. MUSCULOSKELETAL: No joint pain or arthritis.   NEUROLOGIC: No tingling, numbness, weakness.  PSYCHIATRY: No anxiety or depression.   ROS  DRUG ALLERGIES:  No Known Allergies  VITALS:  Blood pressure 124/69, pulse 96, temperature 98.5 F (36.9 C), temperature source Axillary, resp. rate 21, height 5\' 3"  (1.6 m), weight 83.1 kg (183 lb 3.2 oz), SpO2 96 %.  PHYSICAL EXAMINATION:  GENERAL:  70 y.o.-year-old patient lying in the bed with no acute distress.  EYES: Pupils equal, round, reactive to light and accommodation. No scleral icterus. Extraocular muscles intact.  HEENT: Head atraumatic, normocephalic. Oropharynx and nasopharynx clear.  NECK:  Supple, no jugular venous distention. No thyroid enlargement, no tenderness.  LUNGS: Normal breath sounds bilaterally, no wheezing, some crepitation. No use of accessory muscles of respiration.  CARDIOVASCULAR: S1, S2 normal.tachycardia. No murmurs, rubs, or gallops.  ABDOMEN: Soft,  nontender, nondistended. Bowel sounds present. No organomegaly or mass.  EXTREMITIES: No pedal edema, cyanosis, or clubbing.  NEUROLOGIC: Cranial nerves II through XII are intact. Muscle strength 5/5 in all extremities. Sensation intact. Gait not checked.  PSYCHIATRIC: The patient is alert and oriented x 3.  SKIN: No obvious rash, lesion, or ulcer.   Physical Exam LABORATORY PANEL:   CBC  Recent Labs Lab 03/19/16 0835  WBC 10.6  HGB 13.0  HCT 40.0  PLT 229   ------------------------------------------------------------------------------------------------------------------  Chemistries   Recent Labs Lab 03/18/16 2142 03/19/16 0835  NA 142 143  K 3.8 3.0*  CL 100* 93*  CO2 36* 47*  GLUCOSE 133* 103*  BUN 18 14  CREATININE 0.69 0.59  CALCIUM 9.0 8.8*  MG  --  1.5*  AST 21  --   ALT 22  --   ALKPHOS 68  --   BILITOT 0.9  --    ------------------------------------------------------------------------------------------------------------------  Cardiac Enzymes  Recent Labs Lab 03/19/16 0302 03/19/16 0835  TROPONINI 0.07* 0.09*   ------------------------------------------------------------------------------------------------------------------  RADIOLOGY:  Dg Chest Port 1 View  03/18/2016  CLINICAL DATA:  Shortness of Breath EXAM: PORTABLE CHEST 1 VIEW COMPARISON:  02/28/2016 FINDINGS: Cardiac shadow remains enlarged. Left lung remains clear. Increasing right-sided effusion and likely right basilar infiltrate is noted. No acute bony abnormality is seen. Multiple rib fractures are seen. IMPRESSION: Enlarging right pleural effusion and likely right basilar infiltrate. Electronically Signed   By: Inez Catalina M.D.   On: 03/18/2016 22:03    ASSESSMENT AND PLAN:   Active Problems:   Atrial flutter ([place])  1. Decompensated heart failure- Ac on Ch systolic  EF is 25- 30% on recent Echo.   Spironolactone.   IV lasix, follow with cardio consult.  2. Atrial flutter  with RVR    On cardizem IV drip, rate controlled.     Cont oral metoprolol.     On pradaxa for anticoagulation at home.     Awaited cardio consult  3. Acute respiratory distress secondary to heart failure exacerbation    Supplemental oxygen  4. Hypertension     Metoprolol, lisinopril, spironolactone.  5. Hyperlipidemia     Statin  6. Cardiomyopathy    Manage per cardiology.  7. Recurrent right pleural effusion.    No fever or cough.    Pulm consult with Dr. Humphrey Rolls.     All the records are reviewed and case discussed with Care Management/Social Workerr. Management plans discussed with the patient, family and they are in agreement.  CODE STATUS: Full.  TOTAL TIME TAKING CARE OF THIS PATIENT: 40 critical care minutes.  On rate controling IV meds for A fib.   POSSIBLE D/C IN 2-3 DAYS, DEPENDING ON CLINICAL CONDITION.   Vaughan Basta M.D on 03/19/2016   Between 7am to 6pm - Pager - [phone]  After 6pm go to [email or web address] - password [email or web address] [facility]  Office  [phone]  CC: Primary care physician; Glendon Axe, MD  Note: This dictation was prepared with Dragon dictation along with smaller phrase technology. Any transcriptional errors that result from this process are unintentional.

## 2016-03-19 NOTE — H&P (Signed)
[facility] NAME: Elaine Decker    MR#:  [id number]  DATE OF BIRTH:  September 25, 1946  DATE OF ADMISSION:  03/18/2016  PRIMARY CARE PHYSICIAN: Glendon Axe, MD   REQUESTING/REFERRING PHYSICIAN:   CHIEF COMPLAINT:   Chief Complaint  Patient presents with  . Shortness of Breath    HISTORY OF PRESENT ILLNESS: Elaine Decker  is a 70 y.o. female with a known history of atrial fibrillation, COPD, hypertension, congestive heart failure, chronic kidney disease presented to the emergency room with increased shortness of breath for the last 2 months. Patient has been getting progressively short of breath for the last 4 weeks and uses oxygen at home by nasal cannula. Patient also has noticed swelling in both the legs which has been increasing for the last 2 months, has history of orthopnea or proximal nocturnal dyspnea.No complaints of chest pain. No history of fever or chills or cough. No history of headache, dizziness and blurry vision. Patient was evaluated in the emergency room was found to be in atrial flutter. She was started on IV Cardizem drip and hospitalist service was consulted for further care. This was also discussed with [facility] cardiology by ER physician who agree with the plan.  PAST MEDICAL HISTORY:   Past Medical History  Diagnosis Date  . COPD (chronic obstructive pulmonary disease) ([place])   . Hypertension   . Asthma   . CHF (congestive heart failure) ([place])   . A-fib ([place])   . CKD (chronic kidney disease)   . HLD (hyperlipidemia)     PAST SURGICAL HISTORY: Past Surgical History  Procedure Laterality Date  . Surgery on the neck    . Flexible bronchoscopy N/A 02/01/2016    Procedure: FLEXIBLE BRONCHOSCOPY;  Surgeon: Allyne Gee, MD;  Location: [facility] ORS;  Service: Pulmonary;  Laterality: N/A;    SOCIAL HISTORY:  Social History  Substance Use Topics  . Smoking status: Former Smoker -- 2.00 packs/day for 50 years     Quit date: 11/27/2015  . Smokeless tobacco: Not on file  . Alcohol Use: No    FAMILY HISTORY:  Family History  Problem Relation Age of Onset  . CAD Mother   . Lung cancer Father     DRUG ALLERGIES: No Known Allergies  REVIEW OF SYSTEMS:   CONSTITUTIONAL: No fever, fatigue or weakness.  EYES: No blurred or double vision.  EARS, NOSE, AND THROAT: No tinnitus or ear pain.  RESPIRATORY: No cough, has shortness of breath, no wheezing or hemoptysis.  CARDIOVASCULAR: No chest pain, has orthopnea,has edema.  GASTROINTESTINAL: No nausea, vomiting, diarrhea or abdominal pain.  GENITOURINARY: No dysuria, hematuria.  ENDOCRINE: No polyuria, nocturia,  HEMATOLOGY: No anemia, easy bruising or bleeding SKIN: No rash or lesion. MUSCULOSKELETAL: No joint pain or arthritis. Has edema in the lower extremities.  NEUROLOGIC: No tingling, numbness, weakness.  PSYCHIATRY: No anxiety or depression.   MEDICATIONS AT HOME:  Prior to Admission medications   Medication Sig Start Date End Date Taking? Authorizing Provider  acetaminophen (TYLENOL) 325 MG tablet Take 325-650 mg by mouth at bedtime.   Yes Historical Provider, MD  albuterol (PROVENTIL HFA;VENTOLIN HFA) 108 (90 Base) MCG/ACT inhaler Inhale 2 puffs into the lungs every 4 (four) hours as needed for wheezing or shortness of breath.   Yes Historical Provider, MD  atorvastatin (LIPITOR) 20 MG tablet Take 20 mg by mouth at bedtime.    Yes Historical Provider, MD  cholecalciferol (VITAMIN D) 1000  UNITS tablet Take 1,000 Units by mouth daily.   Yes Historical Provider, MD  dabigatran (PRADAXA) 150 MG CAPS capsule Take 150 mg by mouth 2 (two) times daily.   Yes Historical Provider, MD  diltiazem (DILACOR XR) 240 MG 24 hr capsule Take 240 mg by mouth daily.   Yes Historical Provider, MD  Fluticasone-Salmeterol (ADVAIR) 250-50 MCG/DOSE AEPB Inhale 1 puff into the lungs 2 (two) times daily.   Yes Historical Provider, MD  furosemide (LASIX) 40 MG tablet  Take 1 tablet (40 mg total) by mouth daily. 01/24/16  Yes Theodoro Grist, MD  ipratropium-albuterol (DUONEB) 0.5-2.5 (3) MG/3ML SOLN Take 3 mLs by nebulization every 6 (six) hours. Patient taking differently: Take 3 mLs by nebulization every 6 (six) hours as needed (for shortness of breath).  11/30/15  Yes Fritzi Mandes, MD  lisinopril (PRINIVIL) 10 MG tablet Take 1 tablet (10 mg total) by mouth 2 (two) times daily. 01/24/16  Yes Theodoro Grist, MD  montelukast (SINGULAIR) 10 MG tablet Take 10 mg by mouth at bedtime.    Yes Historical Provider, MD  Multiple Vitamin (MULTIVITAMIN) tablet Take 1 tablet by mouth daily.   Yes Historical Provider, MD  propranolol (INDERAL) 40 MG tablet Take 40 mg by mouth 2 (two) times daily.    Yes Historical Provider, MD  spironolactone (ALDACTONE) 25 MG tablet Take 1 tablet (25 mg total) by mouth daily. 01/24/16  Yes Theodoro Grist, MD  tiotropium (SPIRIVA) 18 MCG inhalation capsule Place 18 mcg into inhaler and inhale daily.   Yes Historical Provider, MD  clindamycin (CLEOCIN) 300 MG capsule Take 300 mg by mouth 3 (three) times daily.    Historical Provider, MD      PHYSICAL EXAMINATION:   VITAL SIGNS: Blood pressure 150/92, pulse 146, resp. rate 33, SpO2 92 %.  GENERAL:  70 y.o.-year-old patient lying in the bed in mild respiratory distress.  EYES: Pupils equal, round, reactive to light and accommodation. No scleral icterus. Extraocular muscles intact.  HEENT: Head atraumatic, normocephalic. Oropharynx and nasopharynx clear.  NECK:  Supple, no jugular venous distention. No thyroid enlargement, no tenderness.  LUNGS: Decreased breath sounds bilaterally, no wheezing, bibasilar crepitations heard. No use of accessory muscles of respiration.  CARDIOVASCULAR: S1, S2 irregular. No murmurs, rubs, or gallops.  ABDOMEN: Soft, nontender, nondistended. Bowel sounds present. No organomegaly or mass.  EXTREMITIES:2 plus pedal edema noted, no cyanosis, or clubbing.  NEUROLOGIC:  Cranial nerves II through XII are intact. Muscle strength 5/5 in all extremities. Sensation intact. Gait not checked.  PSYCHIATRIC: The patient is alert and oriented x 3.  SKIN: No obvious rash, lesion, or ulcer.   LABORATORY PANEL:   CBC  Recent Labs Lab 03/18/16 2142  WBC 10.7  HGB 12.6  HCT 39.5  PLT 232  MCV 92.9  MCH 29.6  MCHC 31.8*  RDW 15.7*  LYMPHSABS 0.9*  MONOABS 0.7  EOSABS 0.0  BASOSABS 0.1   ------------------------------------------------------------------------------------------------------------------  Chemistries   Recent Labs Lab 03/18/16 2142  NA 142  K 3.8  CL 100*  CO2 36*  GLUCOSE 133*  BUN 18  CREATININE 0.69  CALCIUM 9.0  AST 21  ALT 22  ALKPHOS 68  BILITOT 0.9   ------------------------------------------------------------------------------------------------------------------ CrCl cannot be calculated (Unknown ideal weight.). ------------------------------------------------------------------------------------------------------------------  Recent Labs  03/18/16 2142  TSH 0.394     Coagulation profile  Recent Labs Lab 03/18/16 2142  INR 1.16   ------------------------------------------------------------------------------------------------------------------- No results for input(s): DDIMER in the last 72 hours. -------------------------------------------------------------------------------------------------------------------  Cardiac Enzymes  Recent Labs Lab 03/18/16 2142  TROPONINI 0.05*   ------------------------------------------------------------------------------------------------------------------ Invalid input(s): POCBNP  ---------------------------------------------------------------------------------------------------------------  Urinalysis No results found for: COLORURINE, APPEARANCEUR, LABSPEC, PHURINE, GLUCOSEU, HGBUR, BILIRUBINUR, KETONESUR, PROTEINUR, UROBILINOGEN, NITRITE,  LEUKOCYTESUR   RADIOLOGY: Dg Chest Port 1 View  03/18/2016  CLINICAL DATA:  Shortness of Breath EXAM: PORTABLE CHEST 1 VIEW COMPARISON:  02/28/2016 FINDINGS: Cardiac shadow remains enlarged. Left lung remains clear. Increasing right-sided effusion and likely right basilar infiltrate is noted. No acute bony abnormality is seen. Multiple rib fractures are seen. IMPRESSION: Enlarging right pleural effusion and likely right basilar infiltrate. Electronically Signed   By: Inez Catalina M.D.   On: 03/18/2016 22:03    EKG: Orders placed or performed during the hospital encounter of 03/18/16  . EKG 12-Lead  . EKG 12-Lead    IMPRESSION AND PLAN: 70 year old female patient with history of atrial fibrillation, hypertension, congestive heart failure, hyperlipidemia presented to the emergency room with increased shortness of breath and swelling in the legs. Admitting diagnosis 1. Decompensated heart failure 2. Atrial flutter 3. Acute respiratory distress secondary to heart failure exacerbation 4. Hypertension 5. Hyperlipidemia 6. Cardiomyopathy Treatment plan Admit patient to stepdown unit IV Cardizem drip for rate control along with oral metoprolol Resume pradaxa for anticoagulation [facility] cardiology consultation Check echocardiogram Cycle troponin to rule out ischemia Oxygen via nasal cannula Resume home medications.  All the records are reviewed and case discussed with ED provider. Management plans discussed with the patient, family and they are in agreement.  CODE STATUS:FULL Code Status History    Date Active Date Inactive Code Status Order ID Comments User Context   01/19/2016  9:13 PM 01/24/2016  7:21 PM Full Code [id number]  Lance Coon, MD Inpatient   11/28/2015 11:41 AM 11/30/2015  5:51 PM Full Code [id number]  Loletha Grayer, MD ED       TOTAL CRITICAL CARE TIME TAKING CARE OF THIS PATIENT: 55 minutes.    Saundra Shelling M.D on 03/19/2016 at 12:07 AM  Between 7am to 6pm -  Pager - [phone]  After 6pm go to [email or web address] - password [email or web address] [facility]  Office  [phone]  CC: Primary care physician; Glendon Axe, MD

## 2016-03-19 NOTE — Consult Note (Signed)
[facility] [place] NOTE  Patient ID: Elaine Decker MRN: [id number] DOB/AGE: 68-Nov-1947 68 y.o.  Admit date: 03/18/2016 Referring Physician Dr. Anselm Jungling Primary Physician Dr. Darl Householder Cardiologist Dr. 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 She now returns with recurrent right pleural effusion and afib with vrv. SHe was placed on iv cardizem with improved rate. She has been anticoagulated with pradaxa. She has a mild troponin elevation which does not appear to be secondary to acs. CXR shows enlarging right plerual effusion and  Probable right basilar infiltrate. She states she has been compliant with her meds.   Review of Systems  HENT: Negative.   Eyes: Negative.   Respiratory: Positive for shortness of breath.   Cardiovascular: Positive for palpitations.  Gastrointestinal: Negative.   Genitourinary: Negative.   Musculoskeletal: Negative.   Skin: Negative.   Neurological: Positive for weakness.  Endo/Heme/Allergies: Negative.   Psychiatric/Behavioral: Negative.     Past Medical History  Diagnosis Date  . COPD (chronic obstructive pulmonary disease) ([place])   . Hypertension   . Asthma   . CHF (congestive heart failure) ([place])   . A-fib ([place])   . CKD (chronic kidney disease)   . HLD (hyperlipidemia)     Family History  Problem Relation Age of Onset  . CAD Mother   . Lung cancer Father     Social History   Social History  . Marital Status: Divorced    Spouse Name: N/A  . Number of Children: N/A  . Years of Education: N/A   Occupational History  . retired    Social History Main Topics  . Smoking status: Former Smoker -- 2.00 packs/day for 50 years    Quit date: 11/27/2015  . Smokeless  tobacco: Not on file  . Alcohol Use: No  . Drug Use: No  . Sexual Activity: Not on file   Other Topics Concern  . Not on file   Social History Narrative    Past Surgical History  Procedure Laterality Date  . Surgery on the neck    . Flexible bronchoscopy N/A 02/01/2016    Procedure: FLEXIBLE BRONCHOSCOPY;  Surgeon: Allyne Gee, MD;  Location: [facility] ORS;  Service: Pulmonary;  Laterality: N/A;     Prescriptions prior to admission  Medication Sig Dispense Refill Last Dose  . acetaminophen (TYLENOL) 325 MG tablet Take 325-650 mg by mouth at bedtime.   03/17/2016 at Unknown time  . albuterol (PROVENTIL HFA;VENTOLIN HFA) 108 (90 Base) MCG/ACT inhaler Inhale 2 puffs into the lungs every 4 (four) hours as needed for wheezing or shortness of breath.   03/18/2016 at prn  . atorvastatin (LIPITOR) 20 MG tablet Take 20 mg by mouth at bedtime.   6 03/17/2016 at Unknown time  . cholecalciferol (VITAMIN D) 1000 UNITS tablet Take 1,000 Units by mouth daily.   03/17/2016 at Unknown time  . dabigatran (PRADAXA) 150 MG CAPS capsule Take 150 mg by mouth 2 (two) times daily.   03/17/2016 at Unknown time  . diltiazem (DILACOR XR) 240 MG 24 hr capsule Take 240 mg by mouth daily.   03/17/2016 at Unknown time  . Fluticasone-Salmeterol (ADVAIR) 250-50 MCG/DOSE AEPB Inhale 1 puff into the lungs 2 (two) times daily.   03/17/2016 at Unknown  time  . furosemide (LASIX) 40 MG tablet Take 1 tablet (40 mg total) by mouth daily. 30 tablet 5 Past Month at Unknown time  . ipratropium-albuterol (DUONEB) 0.5-2.5 (3) MG/3ML SOLN Take 3 mLs by nebulization every 6 (six) hours. (Patient taking differently: Take 3 mLs by nebulization every 6 (six) hours as needed (for shortness of breath). ) 360 mL 1 Past Month at Unknown time  . lisinopril (PRINIVIL) 10 MG tablet Take 1 tablet (10 mg total) by mouth 2 (two) times daily. 60 tablet 6 03/17/2016 at Unknown time  . montelukast (SINGULAIR) 10 MG tablet Take 10 mg by mouth at bedtime.   5 unknown at  unknown  . Multiple Vitamin (MULTIVITAMIN) tablet Take 1 tablet by mouth daily.   03/17/2016 at Unknown time  . propranolol (INDERAL) 40 MG tablet Take 40 mg by mouth 2 (two) times daily.   3 03/17/2016 at Unknown time  . spironolactone (ALDACTONE) 25 MG tablet Take 1 tablet (25 mg total) by mouth daily. 30 tablet 5 03/17/2016 at Unknown time  . tiotropium (SPIRIVA) 18 MCG inhalation capsule Place 18 mcg into inhaler and inhale daily.   03/17/2016 at Unknown time  . clindamycin (CLEOCIN) 300 MG capsule Take 300 mg by mouth 3 (three) times daily.   Not Taking at Unknown time    Physical Exam: Blood pressure 132/71, pulse 104, temperature 98.3 F (36.8 C), temperature source Oral, resp. rate 17, height 5\' 3"  (1.6 m), weight 83.1 kg (183 lb 3.2 oz), SpO2 91 %.   Wt Readings from Last 1 Encounters:  03/19/16 83.1 kg (183 lb 3.2 oz)     General appearance: alert and cooperative Head: Normocephalic, without obvious abnormality, atraumatic Resp: diminished breath sounds RLL and RML and dullness to percussion RLL and RML Cardio: irregularly irregular rhythm GI: soft, non-tender; bowel sounds normal; no masses,  no organomegaly Neurologic: Grossly normal  Labs:   Lab Results  Component Value Date   WBC 10.6 03/19/2016   HGB 13.0 03/19/2016   HCT 40.0 03/19/2016   MCV 91.5 03/19/2016   PLT 229 03/19/2016    Recent Labs Lab 03/18/16 2142 03/19/16 0835  NA 142 143  K 3.8 3.0*  CL 100* 93*  CO2 36* 47*  BUN 18 14  CREATININE 0.69 0.59  CALCIUM 9.0 8.8*  PROT 6.3*  --   BILITOT 0.9  --   ALKPHOS 68  --   ALT 22  --   AST 21  --   GLUCOSE 133* 103*   Lab Results  Component Value Date   TROPONINI 0.05* 03/19/2016      Radiology: right pleural effusion EKG: afib with variable vr  ASSESSMENT AND PLAN:  Pt with history of cardiomyopathy with ef of 25-30% who is admitted with recurrent right pleural effusion and afib with rvr. Rate improved with iv cardizem. Pt is on po cardizem and  pradaxa for anticoagulation as an outpatient. She reports compliance with this. She denies syncope but complains of sob. Will attempt to convert form iv cardizem to po at 30 q 6 initially. Will hold pradaxa for posible thoracentesis. Etiology of right plerual effusion is unclear cut does not appear to be secondary to cardiomypathy due to the unilateral presentation. Would recommend consideration for repeat thoracentesis and consider pleuridesis if it continues to be recurrent.  Signed: Teodoro Spray MD, [facility] 03/19/2016, 4:56 PM

## 2016-03-19 NOTE — Plan of Care (Signed)
Problem: Cardiac: Goal: Ability to achieve and maintain adequate cardiopulmonary perfusion will improve Outcome: Progressing Cardizem gtt discontinued per order; PO cardizem given. Rate still aflutter, rate consistently 99-101.   Problem: Safety: Goal: Ability to remain free from injury will improve Outcome: Progressing Pt high falls since recently d/c'd gtt; yellow non-skid socks in place, bed alarm on and in use. Patient verbalizes understanding of safety while hospitalized.   Problem: Health Behavior/Discharge Planning: Goal: Ability to manage health-related needs will improve Outcome: Progressing Patient educated on importance of compliance with medication and verbalizes understanding, saying, "I'm going to do it right this time."  Problem: Pain Managment: Goal: General experience of comfort will improve Outcome: Progressing No complaints of pain since transfer from CCU to 2A 253.

## 2016-03-20 ENCOUNTER — Inpatient Hospital Stay: Admit: 2016-03-20 | Payer: Medicare Other

## 2016-03-20 ENCOUNTER — Inpatient Hospital Stay: Payer: Medicare Other

## 2016-03-20 LAB — BASIC METABOLIC PANEL
Anion gap: 4 — ABNORMAL LOW (ref 5–15)
BUN: 10 mg/dL (ref 6–20)
CHLORIDE: 91 mmol/L — AB (ref 101–111)
CO2: 46 mmol/L — ABNORMAL HIGH (ref 22–32)
CREATININE: 0.62 mg/dL (ref 0.44–1.00)
Calcium: 8.8 mg/dL — ABNORMAL LOW (ref 8.9–10.3)
Glucose, Bld: 122 mg/dL — ABNORMAL HIGH (ref 65–99)
Potassium: 3.1 mmol/L — ABNORMAL LOW (ref 3.5–5.1)
SODIUM: 141 mmol/L (ref 135–145)

## 2016-03-20 MED ORDER — METOPROLOL TARTRATE 1 MG/ML IV SOLN
5.0000 mg | Freq: Once | INTRAVENOUS | Status: AC
  Administered 2016-03-23: 5 mg via INTRAVENOUS
  Filled 2016-03-20: qty 5

## 2016-03-20 MED ORDER — IPRATROPIUM-ALBUTEROL 0.5-2.5 (3) MG/3ML IN SOLN
3.0000 mL | Freq: Three times a day (TID) | RESPIRATORY_TRACT | Status: DC
  Administered 2016-03-21 – 2016-03-25 (×11): 3 mL via RESPIRATORY_TRACT
  Filled 2016-03-20 (×15): qty 3

## 2016-03-20 MED ORDER — MAGNESIUM SULFATE 2 GM/50ML IV SOLN
2.0000 g | Freq: Once | INTRAVENOUS | Status: AC
  Administered 2016-03-20: 2 g via INTRAVENOUS
  Filled 2016-03-20: qty 50

## 2016-03-20 MED ORDER — POTASSIUM CHLORIDE CRYS ER 20 MEQ PO TBCR: 40.0000 meq | EXTENDED_RELEASE_TABLET | Freq: Two times a day (BID) | ORAL | Status: DC

## 2016-03-20 MED ORDER — DILTIAZEM HCL 60 MG PO TABS
60.0000 mg | ORAL_TABLET | Freq: Four times a day (QID) | ORAL | Status: DC
  Administered 2016-03-20 – 2016-03-23 (×12): 60 mg via ORAL
  Filled 2016-03-20 (×12): qty 1

## 2016-03-20 MED ORDER — POTASSIUM CHLORIDE 10 MEQ/100ML IV SOLN
10.0000 meq | INTRAVENOUS | Status: AC
  Administered 2016-03-20 (×4): 10 meq via INTRAVENOUS
  Filled 2016-03-20 (×4): qty 100

## 2016-03-20 MED ORDER — MAGNESIUM SULFATE 2 GM/50ML IV SOLN
2.0000 g | Freq: Once | INTRAVENOUS | Status: DC
  Filled 2016-03-20: qty 50

## 2016-03-20 NOTE — Progress Notes (Signed)
DR Anselm Jungling and DR Ubaldo Glassing was made aware of pt's HR in the 150's even at rest and 180's with exertion , no new order at this time , will continue to monitor

## 2016-03-20 NOTE — Discharge Instructions (Signed)
Heart Failure Clinic appointment on April 04, 2016 at 11:00am with [name], [place]. Please call [phone] to reschedule.

## 2016-03-20 NOTE — Progress Notes (Signed)
Patient's heart rate is in the 140's, notified MD. New orders given.

## 2016-03-20 NOTE — Progress Notes (Signed)
Initial Heart Failure Clinic appointment scheduled for April 04, 2016 at 11:00am. Thank you.

## 2016-03-20 NOTE — Clinical Documentation Improvement (Signed)
Internal Medicine  Can the diagnosis of Acute Respiratory Distress be further specified? Please document response in next progress note. Thank you!   Document Acuity - Acute, Chronic, Acute on Chronic  Document Inclusion Of - Hypoxia, Hypercapnia, Combination of Both   Respiratory Failure ruled out  Other  Clinically Undetermined  Document any associated diagnoses/conditions.  Supporting Information:  RR ranging between 22 to 33; HR > 100 but with AF/Flutter  Oxygen saturations at 53% in room air, 90, 91% at times when in 3, 4L  Patient is oxygen dependent at home per notes  Please exercise your independent, professional judgment when responding. A specific answer is not anticipated or expected.  Thank You,  Zoila Shutter RN, BSN, [place] [phone]; Cell: [phone]

## 2016-03-20 NOTE — Clinical Documentation Improvement (Signed)
Cardiology Internal Medicine  Can the diagnosis of CKD be further specified? Located in [facility] Main grid of diagnoses. Please document response in next progress note. Thank you!   CKD Stage I - GFR greater than or equal to 90  CKD Stage II - GFR 60-89  CKD Stage III - GFR 30-59  CKD Stage IV - GFR 15-29  CKD Stage V - GFR < 15  ESRD (End Stage Renal Disease)  Other condition  Unable to clinically determine  Supporting Information: : (risk factors, signs and symptoms, diagnostics, treatment)  White female  GFR's for current admission: > 60  Please exercise your independent, professional judgment when responding. A specific answer is not anticipated or expected.  Thank You, Zoila Shutter RN, BSN, [place] [phone]; Cell: [phone]

## 2016-03-20 NOTE — Clinical Documentation Improvement (Signed)
Cardiology Internal Medicine  Can the diagnosis of cardiomyopathy be further specified? Please document response in next progress note. Thank you!   Type:  Alcoholic, Congestive, Constrictive, Dilated, Hypertensive, Idiopathic, Ischemic, Obstructive Hypertrophic/IHSS, [place]  Due to drugs and/or external agent, including specific drug(s) or external agent(s)  Other  Clinically Undetermined  Document any associated diagnoses/conditions.  Supporting Information:  EF 25 to 30%  Please exercise your independent, professional judgment when responding. A specific answer is not anticipated or expected.  Thank You,  Zoila Shutter RN, BSN, [place] [phone]; Cell: [phone]

## 2016-03-20 NOTE — Care Management (Addendum)
Patient admitted with shortness of breath.  Found to  be in atrial fib with rvr- requiring cardizem drip.  Has a pleural effusion that has increased in size and anticipate throracentesis.  Pradaxa will be on hold.  She has also tested positive for c diff and on oral vancomycin.  Currently on 4 liters 02.  Unsure of her baseline.  Has appointment scheduled for heart failure clinic.  last admission patient declined home health follow up because she was goin to return directly to work.  She did require increase in her 02 liter flow at the time of that discharge 2/207- through [facility] Patient

## 2016-03-20 NOTE — Progress Notes (Signed)
[facility] [facility] [place] NAME: Elaine Decker    MR#:  [id number]  DATE OF BIRTH:  09-02-1946  SUBJECTIVE:  CHIEF COMPLAINT:   Chief Complaint  Patient presents with  . Shortness of Breath    Came with SOB, found to have A fib, started on cardizem drip.   She also have repeated Pleural effusions in past few months, required thoracentesis in past.   cardizem drip d/c and on oral now.   Have a fib with tachycardia again this morning.   Repeated diarrhea- found to have C diff.  REVIEW OF SYSTEMS:  CONSTITUTIONAL: No fever, fatigue or weakness.  EYES: No blurred or double vision.  EARS, NOSE, AND THROAT: No tinnitus or ear pain.  RESPIRATORY: No cough, positive  shortness of breath, no wheezing or hemoptysis.  CARDIOVASCULAR: No chest pain, orthopnea, edema.  GASTROINTESTINAL: No nausea, vomiting, diarrhea or abdominal pain.  GENITOURINARY: No dysuria, hematuria.  ENDOCRINE: No polyuria, nocturia,  HEMATOLOGY: No anemia, easy bruising or bleeding SKIN: No rash or lesion. MUSCULOSKELETAL: No joint pain or arthritis.   NEUROLOGIC: No tingling, numbness, weakness.  PSYCHIATRY: No anxiety or depression.   ROS  DRUG ALLERGIES:  No Known Allergies  VITALS:  Blood pressure 125/70, pulse 100, temperature 98.3 F (36.8 C), temperature source Oral, resp. rate 19, height 5\' 3"  (1.6 m), weight 79.47 kg (175 lb 3.2 oz), SpO2 96 %.  PHYSICAL EXAMINATION:  GENERAL:  70 y.o.-year-old patient lying in the bed with no acute distress.  EYES: Pupils equal, round, reactive to light and accommodation. No scleral icterus. Extraocular muscles intact.  HEENT: Head atraumatic, normocephalic. Oropharynx and nasopharynx clear.  NECK:  Supple, no jugular venous distention. No thyroid enlargement, no tenderness.  LUNGS: Normal breath sounds bilaterally, no wheezing, some crepitation. No use of accessory muscles of respiration.  CARDIOVASCULAR: S1, S2  irregular .tachycardia. No murmurs, rubs, or gallops.  ABDOMEN: Soft, nontender, nondistended. Bowel sounds present. No organomegaly or mass.  EXTREMITIES: No pedal edema, cyanosis, or clubbing.  NEUROLOGIC: Cranial nerves II through XII are intact. Muscle strength 5/5 in all extremities. Sensation intact. Gait not checked.  PSYCHIATRIC: The patient is alert and oriented x 3.  SKIN: No obvious rash, lesion, or ulcer.   Physical Exam LABORATORY PANEL:   CBC  Recent Labs Lab 03/19/16 0835  WBC 10.6  HGB 13.0  HCT 40.0  PLT 229   ------------------------------------------------------------------------------------------------------------------  Chemistries   Recent Labs Lab 03/18/16 2142 03/19/16 0835 03/20/16 0519  NA 142 143 141  K 3.8 3.0* 3.1*  CL 100* 93* 91*  CO2 36* 47* 46*  GLUCOSE 133* 103* 122*  BUN 18 14 10   CREATININE 0.69 0.59 0.62  CALCIUM 9.0 8.8* 8.8*  MG  --  1.5*  --   AST 21  --   --   ALT 22  --   --   ALKPHOS 68  --   --   BILITOT 0.9  --   --    ------------------------------------------------------------------------------------------------------------------  Cardiac Enzymes  Recent Labs Lab 03/19/16 0835 03/19/16 1443  TROPONINI 0.09* 0.05*   ------------------------------------------------------------------------------------------------------------------  RADIOLOGY:  Dg Chest 2 View  03/20/2016  CLINICAL DATA:  70 year old female with pleural effusion follow-up. History of COPD, CHF and chronic kidney disease. EXAM: CHEST  2 VIEW COMPARISON:  03/18/2016 and prior exams FINDINGS: Cardiomegaly and pulmonary vascular congestion again noted. A moderate right pleural effusion and small left pleural effusion have slightly increased since 03/18/2016. Bilateral  lower lung atelectasis again noted. There is no evidence of pneumothorax. Remote rib fractures are again noted. IMPRESSION: Slightly increased moderate right pleural effusion and small left  pleural effusion with bibasilar atelectasis. Cardiomegaly and pulmonary vascular congestion. Electronically Signed   By: Margarette Canada M.D.   On: 03/20/2016 14:39   Dg Chest Port 1 View  03/18/2016  CLINICAL DATA:  Shortness of Breath EXAM: PORTABLE CHEST 1 VIEW COMPARISON:  02/28/2016 FINDINGS: Cardiac shadow remains enlarged. Left lung remains clear. Increasing right-sided effusion and likely right basilar infiltrate is noted. No acute bony abnormality is seen. Multiple rib fractures are seen. IMPRESSION: Enlarging right pleural effusion and likely right basilar infiltrate. Electronically Signed   By: Inez Catalina M.D.   On: 03/18/2016 22:03    ASSESSMENT AND PLAN:   Active Problems:   Atrial flutter ([place])  1. Decompensated heart failure- Ac on Ch systolic     EF is 25- [id number] on recent Echo.   Spironolactone.   IV lasix, follow with cardio consult.   Had > 4 ltr diuresis.  2. Atrial flutter with RVR    On cardizem IV drip, rate controlled.     Cont oral metoprolol.     On pradaxa for anticoagulation at home.     appreicated cardio consult     Changed to oral cardizem and increased dose on 03/20/16  3. Acute respiratory distress secondary to heart failure exacerbation    Supplemental oxygen  4. Hypertension     Metoprolol, lisinopril, spironolactone.  5. Hyperlipidemia     Statin  6. Cardiomyopathy    Manage per cardiology.  7. Recurrent right pleural effusion.    No fever or cough.    Pulm consult with Dr. Humphrey Rolls.  8. c diff   Oral vanc.   All the records are reviewed and case discussed with Care Management/Social Workerr. Management plans discussed with the patient, family and they are in agreement.  CODE STATUS: Full.  TOTAL TIME TAKING CARE OF THIS PATIENT: 40 minutes.    POSSIBLE D/C IN 2-3 DAYS, DEPENDING ON CLINICAL CONDITION.   Vaughan Basta M.D on 03/20/2016   Between 7am to 6pm - Pager - [phone]  After 6pm go to [email or web address] - password [email or web address]  [facility]  Office  [phone]  CC: Primary care physician; Glendon Axe, MD  Note: This dictation was prepared with Dragon dictation along with smaller phrase technology. Any transcriptional errors that result from this process are unintentional.

## 2016-03-21 ENCOUNTER — Inpatient Hospital Stay: Payer: Medicare Other

## 2016-03-21 LAB — GLUCOSE, SEROUS FLUID: GLUCOSE FL: 164 mg/dL

## 2016-03-21 LAB — PROTEIN, BODY FLUID: Total protein, fluid: <3 g/dL

## 2016-03-21 LAB — BASIC METABOLIC PANEL
ANION GAP: 4 — AB (ref 5–15)
BUN: 7 mg/dL (ref 6–20)
CHLORIDE: 91 mmol/L — AB (ref 101–111)
CO2: 46 mmol/L — ABNORMAL HIGH (ref 22–32)
Calcium: 8.8 mg/dL — ABNORMAL LOW (ref 8.9–10.3)
Creatinine, Ser: 0.55 mg/dL (ref 0.44–1.00)
GFR calc non Af Amer: >60 mL/min (ref >60)
Glucose, Bld: 123 mg/dL — ABNORMAL HIGH (ref 65–99)
POTASSIUM: 3.5 mmol/L (ref 3.5–5.1)
SODIUM: 141 mmol/L (ref 135–145)

## 2016-03-21 LAB — LACTATE DEHYDROGENASE, PLEURAL OR PERITONEAL FLUID: LD, Fluid: 83 U/L — ABNORMAL HIGH (ref 3–23)

## 2016-03-21 LAB — AMYLASE: Amylase: 18 U/L — ABNORMAL LOW (ref 28–100)

## 2016-03-21 LAB — GLUCOSE, CAPILLARY: Glucose-Capillary: 105 mg/dL — ABNORMAL HIGH (ref 65–99)

## 2016-03-21 MED ORDER — FUROSEMIDE 10 MG/ML IJ SOLN
40.0000 mg | Freq: Two times a day (BID) | INTRAMUSCULAR | Status: DC
  Administered 2016-03-21 – 2016-03-23 (×4): 40 mg via INTRAVENOUS
  Filled 2016-03-21 (×4): qty 4

## 2016-03-21 NOTE — Care Management Important Message (Signed)
Important Message  Patient Details  Name: Elaine Decker MRN: [id number] Date of Birth: 1946/12/11   Medicare Important Message Given:  Yes    Jolly Mango, RN 03/21/2016, 11:28 AM

## 2016-03-22 LAB — BASIC METABOLIC PANEL
ANION GAP: 4 — AB (ref 5–15)
BUN: 9 mg/dL (ref 6–20)
CALCIUM: 9 mg/dL (ref 8.9–10.3)
CO2: 46 mmol/L — ABNORMAL HIGH (ref 22–32)
Chloride: 90 mmol/L — ABNORMAL LOW (ref 101–111)
Creatinine, Ser: 0.73 mg/dL (ref 0.44–1.00)
Glucose, Bld: 123 mg/dL — ABNORMAL HIGH (ref 65–99)
Potassium: 3.8 mmol/L (ref 3.5–5.1)
Sodium: 140 mmol/L (ref 135–145)

## 2016-03-22 LAB — MISC LABCORP TEST (SEND OUT): LABCORP TEST CODE: 19588

## 2016-03-22 MED ORDER — DABIGATRAN ETEXILATE MESYLATE 150 MG PO CAPS
150.0000 mg | ORAL_CAPSULE | Freq: Two times a day (BID) | ORAL | Status: DC
  Administered 2016-03-22 – 2016-03-26 (×8): 150 mg via ORAL
  Filled 2016-03-22 (×11): qty 1

## 2016-03-22 MED ORDER — DABIGATRAN ETEXILATE MESYLATE 150 MG PO CAPS
150.0000 mg | ORAL_CAPSULE | Freq: Two times a day (BID) | ORAL | Status: DC
  Filled 2016-03-22: qty 1

## 2016-03-22 NOTE — Progress Notes (Signed)
[facility] at [place] NAME: Elaine Decker    MR#:  [id number]  DATE OF BIRTH:  [birth year]  SUBJECTIVE:  CHIEF COMPLAINT:   Chief Complaint  Patient presents with  . Shortness of Breath    Came with SOB, found to have A fib, started on cardizem drip.   She also have repeated Pleural effusions in past few months, required thoracentesis in past.   cardizem drip d/c and on oral now.   Have a fib with tachycardia again this morning.   Repeated diarrhea- found to have C diff.   Dr. Ubaldo Glassing scheduled for thorocentesis today.  REVIEW OF SYSTEMS:  CONSTITUTIONAL: No fever, fatigue or weakness.  EYES: No blurred or double vision.  EARS, NOSE, AND THROAT: No tinnitus or ear pain.  RESPIRATORY: No cough, positive  shortness of breath, no wheezing or hemoptysis.  CARDIOVASCULAR: No chest pain, orthopnea, edema.  GASTROINTESTINAL: No nausea, vomiting, diarrhea or abdominal pain.  GENITOURINARY: No dysuria, hematuria.  ENDOCRINE: No polyuria, nocturia,  HEMATOLOGY: No anemia, easy bruising or bleeding SKIN: No rash or lesion. MUSCULOSKELETAL: No joint pain or arthritis.   NEUROLOGIC: No tingling, numbness, weakness.  PSYCHIATRY: No anxiety or depression.   ROS  DRUG ALLERGIES:  No Known Allergies  VITALS:  Blood pressure 112/72, pulse 73, temperature 98.2 F (36.8 C), temperature source Oral, resp. rate 21, height 5\' 3"  (1.6 m), weight 77.429 kg (170 lb 11.2 oz), SpO2 96 %.  PHYSICAL EXAMINATION:  GENERAL:  70 y.o.-year-old patient lying in the bed with no acute distress.  EYES: Pupils equal, round, reactive to light and accommodation. No scleral icterus. Extraocular muscles intact.  HEENT: Head atraumatic, normocephalic. Oropharynx and nasopharynx clear.  NECK:  Supple, no jugular venous distention. No thyroid enlargement, no tenderness.  LUNGS: Normal breath sounds bilaterally, no wheezing, some crepitation. No use of accessory muscles  of respiration.  CARDIOVASCULAR: S1, S2 irregular .tachycardia. No murmurs, rubs, or gallops.  ABDOMEN: Soft, nontender, nondistended. Bowel sounds present. No organomegaly or mass.  EXTREMITIES: No pedal edema, cyanosis, or clubbing.  NEUROLOGIC: Cranial nerves II through XII are intact. Muscle strength 5/5 in all extremities. Sensation intact. Gait not checked.  PSYCHIATRIC: The patient is alert and oriented x 3.  SKIN: No obvious rash, lesion, or ulcer.   Physical Exam LABORATORY PANEL:   CBC  Recent Labs Lab 03/19/16 0835  WBC 10.6  HGB 13.0  HCT 40.0  PLT 229   ------------------------------------------------------------------------------------------------------------------  Chemistries   Recent Labs Lab 03/18/16 2142 03/19/16 0835  03/22/16 0435  NA 142 143  < > 140  K 3.8 3.0*  < > 3.8  CL 100* 93*  < > 90*  CO2 36* 47*  < > 46*  GLUCOSE 133* 103*  < > 123*  BUN 18 14  < > 9  CREATININE 0.69 0.59  < > 0.73  CALCIUM 9.0 8.8*  < > 9.0  MG  --  1.5*  --   --   AST 21  --   --   --   ALT 22  --   --   --   ALKPHOS 68  --   --   --   BILITOT 0.9  --   --   --   < > = values in this interval not displayed. ------------------------------------------------------------------------------------------------------------------  Cardiac Enzymes  Recent Labs Lab 03/19/16 0835 03/19/16 1443  TROPONINI 0.09* 0.05*   ------------------------------------------------------------------------------------------------------------------  RADIOLOGY:  X-ray Chest Pa  Or Ap  03/21/2016  CLINICAL DATA:  Post right-sided thoracentesis EXAM: CHEST 1 VIEW COMPARISON:  03/20/2016; 03/18/2016; 02/09/2016; chest CT - 02/28/2016 FINDINGS: Grossly unchanged enlarged cardiac silhouette and mediastinal contours. Interval reduction in persistent trace right-sided effusion post thoracentesis. No pneumothorax. Unchanged small left-sided pleural effusion. Improved aeration of the right lower lung  with persistent right basilar opacities favored to represent atelectasis or scar. Unchanged left basilar heterogeneous opacities. No new focal airspace opacities. No evidence of edema. Re- demonstrated multiple old bilateral rib fractures. IMPRESSION: 1. Interval reduction in persistent trace right-sided effusion post thoracentesis. No pneumothorax. 2. Improved aeration the right lower lung with persistent bibasilar opacities, left greater than right, atelectasis versus infiltrate. Electronically Signed   By: Sandi Mariscal M.D.   On: 03/21/2016 16:04   Dg Chest 2 View  03/20/2016  CLINICAL DATA:  70 year old female with pleural effusion follow-up. History of COPD, CHF and chronic kidney disease. EXAM: CHEST  2 VIEW COMPARISON:  03/18/2016 and prior exams FINDINGS: Cardiomegaly and pulmonary vascular congestion again noted. A moderate right pleural effusion and small left pleural effusion have slightly increased since 03/18/2016. Bilateral lower lung atelectasis again noted. There is no evidence of pneumothorax. Remote rib fractures are again noted. IMPRESSION: Slightly increased moderate right pleural effusion and small left pleural effusion with bibasilar atelectasis. Cardiomegaly and pulmonary vascular congestion. Electronically Signed   By: Margarette Canada M.D.   On: 03/20/2016 14:39   US Thoracentesis Asp Pleural Space W/img Guide  03/21/2016  INDICATION: Recurrent symptomatic right sided pleural effusion EXAM: US THORACENTESIS ASP PLEURAL SPACE W/IMG GUIDE COMPARISON:  Ultrasound-guided thoracentesis -01/20/2016 ; chest CT - 02/28/2016; chest radiograph - 03/20/2016; 03/18/2016 MEDICATIONS: None. COMPLICATIONS: None immediate. TECHNIQUE: Informed written consent was obtained from the patient after a discussion of the risks, benefits and alternatives to treatment. A timeout was performed prior to the initiation of the procedure. Initial ultrasound scanning demonstrates a moderate-sized anechoic right-sided pleural  effusion. The lower chest was prepped and draped in the usual sterile fashion. 1% lidocaine was used for local anesthesia. An ultrasound image was saved for documentation purposes. An 8 Fr Safe-T-Centesis catheter was introduced. The thoracentesis was performed. The catheter was removed and a dressing was applied. The patient tolerated the procedure well without immediate post procedural complication. The patient was escorted to have an upright chest radiograph. FINDINGS: A total of approximately 800 cc of serous fluid was removed. Requested samples were sent to the laboratory. IMPRESSION: Successful ultrasound-guided right sided thoracentesis yielding 800 cc of pleural fluid. Electronically Signed   By: Sandi Mariscal M.D.   On: 03/21/2016 16:42    ASSESSMENT AND PLAN:   Active Problems:   Atrial flutter ([place])  1. Decompensated heart failure- Ac on Ch systolic     EF is 25- [id number] on recent Echo.   Spironolactone.   IV lasix, follow with cardio consult.   Had > 4 ltr diuresis.  2. Atrial flutter with RVR    On cardizem IV drip, rate controlled.     Cont oral metoprolol.     On pradaxa for anticoagulation at home.     appreicated cardio consult     Changed to oral cardizem and increased dose on 03/20/16   Still have some tachycardia episodes, will get thorocentesis today.  3. Acute respiratory distress secondary to heart failure exacerbation    Supplemental oxygen   Also have pleural effusion, thorocentesis today.  4. Hypertension     Metoprolol, lisinopril, spironolactone.  5. Hyperlipidemia  Statin  6. Cardiomyopathy    Manage per cardiology.  7. Recurrent right pleural effusion.    No fever or cough.    Pulm consult with Dr. Humphrey Rolls, still awaited.  8. c diff   Oral vanc.   Diarrhea better now.   All the records are reviewed and case discussed with Care Management/Social Workerr. Management plans discussed with the patient, family and they are in agreement.  CODE STATUS:  Full.  TOTAL TIME TAKING CARE OF THIS PATIENT: 40 minutes.    POSSIBLE D/C IN 2-3 DAYS, DEPENDING ON CLINICAL CONDITION.   Vaughan Basta M.D on 03/22/2016   Between 7am to 6pm - Pager - [phone]  After 6pm go to [email or web address] - password [email or web address] [facility]  Office  [phone]  CC: Primary care physician; Glendon Axe, MD  Note: This dictation was prepared with Dragon dictation along with smaller phrase technology. Any transcriptional errors that result from this process are unintentional.

## 2016-03-22 NOTE — Care Management (Signed)
Attempted to see patient. Respiratory therapy was just beginning to work with patient. Will follow up.

## 2016-03-22 NOTE — Progress Notes (Signed)
Notified by Dr. Anselm Jungling to stop pradaxa due to possible surgery tomorrow.

## 2016-03-23 DIAGNOSIS — J90 Pleural effusion, not elsewhere classified: Secondary | ICD-10-CM | POA: Insufficient documentation

## 2016-03-23 LAB — CBC
HEMATOCRIT: 38.5 % (ref 35.0–47.0)
Hemoglobin: 12.3 g/dL (ref 12.0–16.0)
MCH: 29.1 pg (ref 26.0–34.0)
MCHC: 31.8 g/dL — AB (ref 32.0–36.0)
MCV: 91.5 fL (ref 80.0–100.0)
Platelets: 244 10*3/uL (ref 150–440)
RBC: 4.21 MIL/uL (ref 3.80–5.20)
RDW: 15.8 % — AB (ref 11.5–14.5)
WBC: 12.2 10*3/uL — ABNORMAL HIGH (ref 3.6–11.0)

## 2016-03-23 LAB — GLUCOSE, CAPILLARY: Glucose-Capillary: 103 mg/dL — ABNORMAL HIGH (ref 65–99)

## 2016-03-23 LAB — CYTOLOGY - NON PAP

## 2016-03-23 MED ORDER — DILTIAZEM HCL ER COATED BEADS 240 MG PO CP24
240.0000 mg | ORAL_CAPSULE | Freq: Every day | ORAL | Status: DC
  Administered 2016-03-23 – 2016-03-26 (×4): 240 mg via ORAL
  Filled 2016-03-23 (×4): qty 1

## 2016-03-23 MED ORDER — METOPROLOL TARTRATE 50 MG PO TABS
50.0000 mg | ORAL_TABLET | Freq: Two times a day (BID) | ORAL | Status: DC
  Administered 2016-03-23 – 2016-03-26 (×6): 50 mg via ORAL
  Filled 2016-03-23 (×6): qty 1

## 2016-03-23 MED ORDER — FUROSEMIDE 40 MG PO TABS
40.0000 mg | ORAL_TABLET | Freq: Every day | ORAL | Status: DC
  Administered 2016-03-23 – 2016-03-26 (×4): 40 mg via ORAL
  Filled 2016-03-23 (×4): qty 1

## 2016-03-23 NOTE — Care Management (Signed)
Discharge delayed due to unstable HR. Patient lives at home alone. She continues to work. She has and uses chronic O2. She plans to return to work after discharge. No needs anticipated at this time. Provided CM contact information

## 2016-03-23 NOTE — Progress Notes (Signed)
Elaine Decker at [place] NAME: Elaine Decker    MR#:  [id number]  DATE OF BIRTH:  1946-02-02  SUBJECTIVE:  CHIEF COMPLAINT:   Chief Complaint  Patient presents with  . Shortness of Breath    Came with SOB, found to have A fib, started on cardizem drip.   She also have repeated Pleural effusions in past few months, required thoracentesis in past.   cardizem drip d/c and on oral now.   Have a fib with tachycardia again this morning.   Repeated diarrhea- found to have C diff.   Dr. Ubaldo Glassing scheduled for thorocentesis - it mainly showed transudate.   Will get Thorasic surgery to help with recurrent effusion problem.  REVIEW OF SYSTEMS:  CONSTITUTIONAL: No fever, fatigue or weakness.  EYES: No blurred or double vision.  EARS, NOSE, AND THROAT: No tinnitus or ear pain.  RESPIRATORY: No cough, positive  shortness of breath, no wheezing or hemoptysis.  CARDIOVASCULAR: No chest pain, orthopnea, edema.  GASTROINTESTINAL: No nausea, vomiting, diarrhea or abdominal pain.  GENITOURINARY: No dysuria, hematuria.  ENDOCRINE: No polyuria, nocturia,  HEMATOLOGY: No anemia, easy bruising or bleeding SKIN: No rash or lesion. MUSCULOSKELETAL: No joint pain or arthritis.   NEUROLOGIC: No tingling, numbness, weakness.  PSYCHIATRY: No anxiety or depression.   ROS  DRUG ALLERGIES:  No Known Allergies  VITALS:  Blood pressure 104/51, pulse 101, temperature 98.2 F (36.8 C), temperature source Oral, resp. rate 19, height 5\' 3"  (1.6 m), weight 75.388 kg (166 lb 3.2 oz), SpO2 95 %.  PHYSICAL EXAMINATION:  GENERAL:  70 y.o.-year-old patient lying in the bed with no acute distress.  EYES: Pupils equal, round, reactive to light and accommodation. No scleral icterus. Extraocular muscles intact.  HEENT: Head atraumatic, normocephalic. Oropharynx and nasopharynx clear.  NECK:  Supple, no jugular venous distention. No thyroid enlargement, no tenderness.  LUNGS:  Normal breath sounds bilaterally, no wheezing, some crepitation. No use of accessory muscles of respiration.  CARDIOVASCULAR: S1, S2 irregular .tachycardia. No murmurs, rubs, or gallops.  ABDOMEN: Soft, nontender, nondistended. Bowel sounds present. No organomegaly or mass.  EXTREMITIES: No pedal edema, cyanosis, or clubbing.  NEUROLOGIC: Cranial nerves II through XII are intact. Muscle strength 5/5 in all extremities. Sensation intact. Gait not checked.  PSYCHIATRIC: The patient is alert and oriented x 3.  SKIN: No obvious rash, lesion, or ulcer.   Physical Exam LABORATORY PANEL:   CBC  Recent Labs Lab 03/23/16 0642  WBC 12.2*  HGB 12.3  HCT 38.5  PLT 244   ------------------------------------------------------------------------------------------------------------------  Chemistries   Recent Labs Lab 03/18/16 2142 03/19/16 0835  03/22/16 0435  NA 142 143  < > 140  K 3.8 3.0*  < > 3.8  CL 100* 93*  < > 90*  CO2 36* 47*  < > 46*  GLUCOSE 133* 103*  < > 123*  BUN 18 14  < > 9  CREATININE 0.69 0.59  < > 0.73  CALCIUM 9.0 8.8*  < > 9.0  MG  --  1.5*  --   --   AST 21  --   --   --   ALT 22  --   --   --   ALKPHOS 68  --   --   --   BILITOT 0.9  --   --   --   < > = values in this interval not displayed. ------------------------------------------------------------------------------------------------------------------  Cardiac Enzymes  Recent Labs Lab 03/19/16  [id number] 03/19/16 1443  TROPONINI 0.09* 0.05*   ------------------------------------------------------------------------------------------------------------------  RADIOLOGY:  X-ray Chest Pa Or Ap  03/21/2016  CLINICAL DATA:  Post right-sided thoracentesis EXAM: CHEST 1 VIEW COMPARISON:  03/20/2016; 03/18/2016; 02/09/2016; chest CT - 02/28/2016 FINDINGS: Grossly unchanged enlarged cardiac silhouette and mediastinal contours. Interval reduction in persistent trace right-sided effusion post thoracentesis. No  pneumothorax. Unchanged small left-sided pleural effusion. Improved aeration of the right lower lung with persistent right basilar opacities favored to represent atelectasis or scar. Unchanged left basilar heterogeneous opacities. No new focal airspace opacities. No evidence of edema. Re- demonstrated multiple old bilateral rib fractures. IMPRESSION: 1. Interval reduction in persistent trace right-sided effusion post thoracentesis. No pneumothorax. 2. Improved aeration the right lower lung with persistent bibasilar opacities, left greater than right, atelectasis versus infiltrate. Electronically Signed   By: Sandi Mariscal M.D.   On: 03/21/2016 16:04   US Thoracentesis Asp Pleural Space W/img Guide  03/21/2016  INDICATION: Recurrent symptomatic right sided pleural effusion EXAM: US THORACENTESIS ASP PLEURAL SPACE W/IMG GUIDE COMPARISON:  Ultrasound-guided thoracentesis -01/20/2016 ; chest CT - 02/28/2016; chest radiograph - 03/20/2016; 03/18/2016 MEDICATIONS: None. COMPLICATIONS: None immediate. TECHNIQUE: Informed written consent was obtained from the patient after a discussion of the risks, benefits and alternatives to treatment. A timeout was performed prior to the initiation of the procedure. Initial ultrasound scanning demonstrates a moderate-sized anechoic right-sided pleural effusion. The lower chest was prepped and draped in the usual sterile fashion. 1% lidocaine was used for local anesthesia. An ultrasound image was saved for documentation purposes. An 8 Fr Safe-T-Centesis catheter was introduced. The thoracentesis was performed. The catheter was removed and a dressing was applied. The patient tolerated the procedure well without immediate post procedural complication. The patient was escorted to have an upright chest radiograph. FINDINGS: A total of approximately 800 cc of serous fluid was removed. Requested samples were sent to the laboratory. IMPRESSION: Successful ultrasound-guided right sided  thoracentesis yielding 800 cc of pleural fluid. Electronically Signed   By: Sandi Mariscal M.D.   On: 03/21/2016 16:42    ASSESSMENT AND PLAN:   Active Problems:   Atrial flutter ([facility])   Pleural effusion  1. Decompensated heart failure- Ac on Ch systolic     EF is 25- [id number] on recent Echo.   Spironolactone.   IV lasix, follow with cardio consult.   Had > 6 ltr diuresis.  2. Atrial flutter with RVR    On cardizem IV drip, rate controlled.     Cont oral metoprolol.     On pradaxa for anticoagulation at home.     appreicated cardio consult     Changed to oral cardizem and increased dose on 03/20/16      Rate is reasonably controlled after thoracentesis 03/21/16.  3. Acute respiratory distress secondary to heart failure exacerbation    Supplemental oxygen   Also have pleural effusion, thorocentesis showes transudate 03/21/16.    On home O2.  4. Hypertension     Metoprolol, lisinopril, spironolactone.  5. Hyperlipidemia     Statin  6. Cardiomyopathy    Manage per cardiology.  7. Recurrent right pleural effusion.    No fever or cough.    Pulm consult with Dr. Humphrey Rolls, still awaited.   Called thorasic surgery to help.  8. c diff   Oral vanc.   Diarrhea better now.   All the records are reviewed and case discussed with Care Management/Social Workerr. Management plans discussed with the patient, family and they are in agreement.  CODE STATUS: Full.  TOTAL TIME TAKING CARE OF THIS PATIENT: 40 minutes.  Spoke to Dr. Genevive Bi.  POSSIBLE D/C IN 2-3 DAYS, DEPENDING ON CLINICAL CONDITION.   Vaughan Basta M.D on 03/23/2016   Between 7am to 6pm - Pager - [phone]  After 6pm go to [email or web address] - password [email or web address] [facility]  Office  [phone]  CC: Primary care physician; Glendon Axe, MD  Note: This dictation was prepared with Dragon dictation along with smaller phrase technology. Any transcriptional errors that result from this process are  unintentional.

## 2016-03-23 NOTE — Progress Notes (Signed)
A&O. Independent. Diarrhea is much better. Still on O2 at 2L. Still IN Afib. Pradaxa held for possible pleurex insertions today, however patient has not decided if she is agreeable.

## 2016-03-23 NOTE — Progress Notes (Signed)
[facility] [place] NAME: Elaine Decker    MR#:  [id number]  DATE OF BIRTH:  07/27/1946  SUBJECTIVE:  CHIEF COMPLAINT:   Chief Complaint  Patient presents with  . Shortness of Breath    Came with SOB, found to have A fib, started on cardizem drip.   She also have repeated Pleural effusions in past few months, required thoracentesis in past.   cardizem drip d/c and on oral now.   Have a fib with tachycardia again this morning.   Repeated diarrhea- found to have C diff.   Dr. Ubaldo Glassing scheduled for thorocentesis - it mainly showed transudate.   Dr. Genevive Bi gave surgicla options of pleurodesis- pt refused.   Today still had HR up to 140-150- and needed IV metoprolol. Increased cardizem and added metoprolol.  REVIEW OF SYSTEMS:  CONSTITUTIONAL: No fever, fatigue or weakness.  EYES: No blurred or double vision.  EARS, NOSE, AND THROAT: No tinnitus or ear pain.  RESPIRATORY: No cough, positive  shortness of breath, no wheezing or hemoptysis.  CARDIOVASCULAR: No chest pain, orthopnea, edema.  GASTROINTESTINAL: No nausea, vomiting, diarrhea or abdominal pain.  GENITOURINARY: No dysuria, hematuria.  ENDOCRINE: No polyuria, nocturia,  HEMATOLOGY: No anemia, easy bruising or bleeding SKIN: No rash or lesion. MUSCULOSKELETAL: No joint pain or arthritis.   NEUROLOGIC: No tingling, numbness, weakness.  PSYCHIATRY: No anxiety or depression.   ROS  DRUG ALLERGIES:  No Known Allergies  VITALS:  Blood pressure 107/57, pulse 93, temperature 98.2 F (36.8 C), temperature source Oral, resp. rate 18, height 5\' 3"  (1.6 m), weight 75.388 kg (166 lb 3.2 oz), SpO2 95 %.  PHYSICAL EXAMINATION:  GENERAL:  70 y.o.-year-old patient lying in the bed with no acute distress.  EYES: Pupils equal, round, reactive to light and accommodation. No scleral icterus. Extraocular muscles intact.  HEENT: Head atraumatic, normocephalic. Oropharynx and nasopharynx clear.   NECK:  Supple, no jugular venous distention. No thyroid enlargement, no tenderness.  LUNGS: Normal breath sounds bilaterally, no wheezing, some crepitation. No use of accessory muscles of respiration.  CARDIOVASCULAR: S1, S2 irregular .tachycardia. No murmurs, rubs, or gallops.  ABDOMEN: Soft, nontender, nondistended. Bowel sounds present. No organomegaly or mass.  EXTREMITIES: No pedal edema, cyanosis, or clubbing.  NEUROLOGIC: Cranial nerves II through XII are intact. Muscle strength 5/5 in all extremities. Sensation intact. Gait not checked.  PSYCHIATRIC: The patient is alert and oriented x 3.  SKIN: No obvious rash, lesion, or ulcer.   Physical Exam LABORATORY PANEL:   CBC  Recent Labs Lab 03/23/16 0642  WBC 12.2*  HGB 12.3  HCT 38.5  PLT 244   ------------------------------------------------------------------------------------------------------------------  Chemistries   Recent Labs Lab 03/18/16 2142 03/19/16 0835  03/22/16 0435  NA 142 143  < > 140  K 3.8 3.0*  < > 3.8  CL 100* 93*  < > 90*  CO2 36* 47*  < > 46*  GLUCOSE 133* 103*  < > 123*  BUN 18 14  < > 9  CREATININE 0.69 0.59  < > 0.73  CALCIUM 9.0 8.8*  < > 9.0  MG  --  1.5*  --   --   AST 21  --   --   --   ALT 22  --   --   --   ALKPHOS 68  --   --   --   BILITOT 0.9  --   --   --   < > =  values in this interval not displayed. ------------------------------------------------------------------------------------------------------------------  Cardiac Enzymes  Recent Labs Lab 03/19/16 0835 03/19/16 1443  TROPONINI 0.09* 0.05*   ------------------------------------------------------------------------------------------------------------------  RADIOLOGY:  US Thoracentesis Asp Pleural Space W/img Guide  03/21/2016  INDICATION: Recurrent symptomatic right sided pleural effusion EXAM: US THORACENTESIS ASP PLEURAL SPACE W/IMG GUIDE COMPARISON:  Ultrasound-guided thoracentesis -01/20/2016 ; chest CT -  02/28/2016; chest radiograph - 03/20/2016; 03/18/2016 MEDICATIONS: None. COMPLICATIONS: None immediate. TECHNIQUE: Informed written consent was obtained from the patient after a discussion of the risks, benefits and alternatives to treatment. A timeout was performed prior to the initiation of the procedure. Initial ultrasound scanning demonstrates a moderate-sized anechoic right-sided pleural effusion. The lower chest was prepped and draped in the usual sterile fashion. 1% lidocaine was used for local anesthesia. An ultrasound image was saved for documentation purposes. An 8 Fr Safe-T-Centesis catheter was introduced. The thoracentesis was performed. The catheter was removed and a dressing was applied. The patient tolerated the procedure well without immediate post procedural complication. The patient was escorted to have an upright chest radiograph. FINDINGS: A total of approximately 800 cc of serous fluid was removed. Requested samples were sent to the laboratory. IMPRESSION: Successful ultrasound-guided right sided thoracentesis yielding 800 cc of pleural fluid. Electronically Signed   By: Sandi Mariscal M.D.   On: 03/21/2016 16:42    ASSESSMENT AND PLAN:   Active Problems:   Atrial flutter (HCC)   Pleural effusion  1. Decompensated heart failure- Ac on Ch systolic     EF is 25- [id number] on recent Echo.   Spironolactone.   IV lasix, follow with cardio consult.   Had > 6 ltr diuresis.  2. Atrial flutter with RVR     On cardizem IV drip, rate controlled.     was oral metoprolol and cardizem, increased does 03/23/16 for better control.     On pradaxa for anticoagulation at home.     appreicated cardio consult.     Changed to oral cardizem and increased dose on 03/20/16     Given IV metoprolol one dose 03/23/16.  3. Acute respiratory distress secondary to heart failure exacerbation    Supplemental oxygen   Also have pleural effusion, thorocentesis showes transudate 03/21/16.    On home O2.  4.  Hypertension     Metoprolol, lisinopril, spironolactone.  5. Hyperlipidemia     Statin  6. Cardiomyopathy    Manage per cardiology.  7. Recurrent right pleural effusion.    No fever or cough.    Pulm consult with Dr. Humphrey Rolls, still awaited.   Called thorasic surgery to help.   Pt want to continue trying medical therapy for now and will contact Dr. Humphrey Rolls - if she changes her mind.  8. c diff   Oral vanc.   Diarrhea better now.   All the records are reviewed and case discussed with Care Management/Social Workerr. Management plans discussed with the patient, family and they are in agreement.  CODE STATUS: Full.  critical today due to HR again out of control and needing Injection.  TOTAL TIME TAKING CARE OF THIS PATIENT: 40 critical care minutes.   POSSIBLE D/C IN 2-3 DAYS, DEPENDING ON CLINICAL CONDITION.   Vaughan Basta M.D on 03/23/2016   Between 7am to 6pm - Pager - [phone]  After 6pm go to [email or web address] - password [email or web address] [facility]  Office  [phone]  CC: Primary care physician; Glendon Axe, MD  Note: This dictation was prepared with Dragon dictation along with smaller  Company secretary. Any transcriptional errors that result from this process are unintentional.

## 2016-03-23 NOTE — Progress Notes (Signed)
[facility]  SUBJECTIVE: feels better after thoracentesis. Pt is deferring pleuridesis.    Filed Vitals:   03/22/16 1938 03/22/16 2004 03/23/16 0427 03/23/16 0500  BP: 113/61  104/51   Pulse: 85  58 101  Temp: 97.8 F (36.6 C)  98.2 F (36.8 C)   TempSrc: Oral  Oral   Resp: 18  19   Height:      Weight:   75.388 kg (166 lb 3.2 oz)   SpO2: 94% 95% 95%     Intake/Output Summary (Last 24 hours) at 03/23/16 0835 Last data filed at 03/23/16 0230  Gross per 24 hour  Intake      0 ml  Output   1000 ml  Net  -1000 ml    LABS: Basic Metabolic Panel:  Recent Labs  03/21/16 0432 03/22/16 0435  NA 141 140  K 3.5 3.8  CL 91* 90*  CO2 46* 46*  GLUCOSE 123* 123*  BUN 7 9  CREATININE 0.55 0.73  CALCIUM 8.8* 9.0   Liver Function Tests: No results for input(s): AST, ALT, ALKPHOS, BILITOT, PROT, ALBUMIN in the last 72 hours.  Recent Labs  03/21/16 1631  AMYLASE 18*   CBC:  Recent Labs  03/23/16 0642  WBC 12.2*  HGB 12.3  HCT 38.5  MCV 91.5  PLT 244   Cardiac Enzymes: No results for input(s): CKTOTAL, CKMB, CKMBINDEX, TROPONINI in the last 72 hours. BNP: Invalid input(s): POCBNP D-Dimer: No results for input(s): DDIMER in the last 72 hours. Hemoglobin A1C: No results for input(s): HGBA1C in the last 72 hours. Fasting Lipid Panel: No results for input(s): CHOL, HDL, LDLCALC, TRIG, CHOLHDL, LDLDIRECT in the last 72 hours. Thyroid Function Tests: No results for input(s): TSH, T4TOTAL, T3FREE, THYROIDAB in the last 72 hours.  Invalid input(s): FREET3 Anemia Panel: No results for input(s): VITAMINB12, FOLATE, FERRITIN, TIBC, IRON, RETICCTPCT in the last 72 hours.   Physical Exam: Blood pressure 104/51, pulse 101, temperature 98.2 F (36.8 C), temperature source Oral, resp. rate 19, height 5\' 3"  (1.6 m), weight 75.388 kg (166 lb 3.2 oz), SpO2 95 %.   Wt Readings from Last 1 Encounters:  03/23/16 75.388 kg (166 lb 3.2 oz)      General appearance: alert and cooperative Head: Normocephalic, without obvious abnormality, atraumatic Resp: rhonchi bilaterally Cardio: irregularly irregular rhythm GI: soft, non-tender; bowel sounds normal; no masses,  no organomegaly Pulses: 2+ and symmetric Neurologic: Grossly normal  TELEMETRY: Reviewed telemetry pt in afib with variable vr:  ASSESSMENT AND PLAN:  Active Problems:   Atrial flutter (HCC)-afib/flutter with variable vr. Would resume pradaxa as pt has refused pleuradesis. Would continue rate control with cardizem/metoprolol. Will convert to cardizem cd 240 mg daily and metoprolol 50 bid. Smoking cessation discussed. Compliance with cpap discussed.     Teodoro Spray., MD, [facility] 03/23/2016 8:35 AM

## 2016-03-23 NOTE — Progress Notes (Signed)
Patient ID: Elaine Decker, female   DOB: Oct 05, 1946, 70 y.o.   MRN: [id number]  Chief Complaint  Patient presents with  . Shortness of Breath    Referred By Dr. Anselm Jungling Reason for Referral recurrent right pleural effusion  HPI Location, Quality, Duration, Severity, Timing, Context, Modifying Factors, Associated Signs and Symptoms.  Elaine Decker is a 70 y.o. female.  She has undergone 2 thoracenteses in the last several months for recurrent right-sided pleural effusion. The etiology of her pleural effusion is still being evaluated but she does have a significant history of cardiomyopathy with an ejection fraction of less than 30%. Recently she was diagnosed with atrial fibrillation and exacerbation of her congestive heart failure. The patient also has a previous history of smoking and multiple CT scans confirmed the presence of mild emphysematous changes but no obvious malignancy. She tells me that she did not notice any significant improvement following her most recent thoracentesis. This was of approximately 800 cc. Subsequent chest x-rays confirmed minimal pleural effusion present. The patient believes that the most recent exacerbation of her heart failure may be secondary to noncompliance. She admits that she has not followed her instructions as she would like. Currently she does not feel short of breath.   Past Medical History  Diagnosis Date  . COPD (chronic obstructive pulmonary disease) ([place])   . Hypertension   . Asthma   . CHF (congestive heart failure) ([place])   . A-fib ([place])   . CKD (chronic kidney disease)   . HLD (hyperlipidemia)     Past Surgical History  Procedure Laterality Date  . Surgery on the neck    . Flexible bronchoscopy N/A 02/01/2016    Procedure: FLEXIBLE BRONCHOSCOPY;  Surgeon: Allyne Gee, MD;  Location: [facility] ORS;  Service: Pulmonary;  Laterality: N/A;    Family History  Problem Relation Age of Onset  . CAD Mother   . Lung cancer Father     Social  History Social History  Substance Use Topics  . Smoking status: Former Smoker -- 2.00 packs/day for 50 years    Quit date: 11/27/2015  . Smokeless tobacco: None  . Alcohol Use: No    No Known Allergies  Current Facility-Administered Medications  Medication Dose Route Frequency Provider Last Rate Last Dose  . 0.9 %  sodium chloride infusion  250 mL Intravenous PRN Saundra Shelling, MD      . acetaminophen (TYLENOL) tablet 650 mg  650 mg Oral Q4H PRN Pavan Pyreddy, MD      . albuterol (PROVENTIL) (2.5 MG/3ML) 0.083% nebulizer solution 3 mL  3 mL Inhalation Q4H PRN Saundra Shelling, MD      . aspirin EC tablet 81 mg  81 mg Oral Daily Saundra Shelling, MD   81 mg at 03/22/16 0918  . atorvastatin (LIPITOR) tablet 20 mg  20 mg Oral QHS Saundra Shelling, MD   20 mg at 03/22/16 2313  . cholecalciferol (VITAMIN D) tablet 1,000 Units  1,000 Units Oral Daily Saundra Shelling, MD   1,000 Units at 03/22/16 0919  . dabigatran (PRADAXA) capsule 150 mg  150 mg Oral Q12H Teodoro Spray, MD   150 mg at 03/22/16 1430  . diltiazem (CARDIZEM CD) 24 hr capsule 240 mg  240 mg Oral Daily Teodoro Spray, MD      . furosemide (LASIX) injection 40 mg  40 mg Intravenous Q12H Teodoro Spray, MD   40 mg at 03/23/16 0546  . ipratropium-albuterol (DUONEB) 0.5-2.5 (3) MG/3ML nebulizer  solution 3 mL  3 mL Nebulization TID Saundra Shelling, MD   3 mL at 03/23/16 0855  . lisinopril (PRINIVIL,ZESTRIL) tablet 10 mg  10 mg Oral BID Saundra Shelling, MD   10 mg at 03/22/16 2312  . metoprolol (LOPRESSOR) injection 5 mg  5 mg Intravenous Once Sylvan Cheese, MD      . metoprolol (LOPRESSOR) tablet 50 mg  50 mg Oral BID Teodoro Spray, MD      . mometasone-formoterol [facility]) 200-5 MCG/ACT inhaler 2 puff  2 puff Inhalation BID Saundra Shelling, MD   2 puff at 03/22/16 2314  . montelukast (SINGULAIR) tablet 10 mg  10 mg Oral QHS Saundra Shelling, MD   10 mg at 03/22/16 2312  . multivitamin with minerals tablet 1 tablet  1 tablet Oral Daily Saundra Shelling, MD    1 tablet at 03/22/16 0919  . ondansetron (ZOFRAN) injection 4 mg  4 mg Intravenous Q6H PRN Pavan Pyreddy, MD      . potassium chloride SA (K-DUR,KLOR-CON) CR tablet 20 mEq  20 mEq Oral BID Vaughan Basta, MD   20 mEq at 03/22/16 2311  . sodium chloride flush (NS) 0.9 % injection 3 mL  3 mL Intravenous Q12H Saundra Shelling, MD   3 mL at 03/22/16 2314  . sodium chloride flush (NS) 0.9 % injection 3 mL  3 mL Intravenous PRN Saundra Shelling, MD   3 mL at 03/21/16 0604  . spironolactone (ALDACTONE) tablet 25 mg  25 mg Oral Daily Saundra Shelling, MD   25 mg at 03/22/16 0919  . vancomycin (VANCOCIN) 50 mg/mL oral solution 125 mg  125 mg Oral 4 times per day Sylvan Cheese, MD   125 mg at 03/23/16 0545      Review of Systems A complete review of systems was asked and was negative except for the following positive findingsLower extremity swelling which is now improved. Shortness of breath which is now improved. No fevers chills or hemoptysis.  Blood pressure 104/51, pulse 101, temperature 98.2 F (36.8 C), temperature source Oral, resp. rate 19, height 5\' 3"  (1.6 m), weight 166 lb 3.2 oz (75.388 kg), SpO2 95 %.  Physical Exam CONSTITUTIONAL:  Pleasant, well-developed, well-nourished, and in no acute distress. EYES: Pupils equal and reactive to light, Sclera non-icteric EARS, NOSE, MOUTH AND THROAT:  The oropharynx was clear.  Dentures on the top.  Oral mucosa pink and moist. LYMPH NODES:  Lymph nodes in the neck and axillae were normal RESPIRATORY:  Lungs with by basilar rales..  Normal respiratory effort without pathologic use of accessory muscles of respiration CARDIOVASCULAR: Heart was irregularly irregular without murmurs.  There were no carotid bruits. Peripheral pulses were absent. GI: The abdomen was soft, nontender, and nondistended. There were no palpable masses. There was no hepatosplenomegaly. There were normal bowel sounds in all quadrants. GU:  Rectal deferred.   MUSCULOSKELETAL:   Normal muscle strength and tone.  No clubbing or cyanosis.   SKIN:  There were no pathologic skin lesions.  There were no nodules on palpation. NEUROLOGIC:  Sensation is normal.  Cranial nerves are grossly intact. PSYCH:  Oriented to person, place and time.  Mood and affect are normal.  Data Reviewed I have reviewed her CT scans and chest x-rays.  I have personally reviewed the patient's imaging, laboratory findings and medical records.    Assessment    Recurrent right-sided pleural effusion. Etiology is still being evaluated.    Plan    I had a long discussion  with the patient regarding the options. I discussed with her the possibility of performing repeat thoracentesis, thoracoscopy with pleural biopsy and talc pleurodesis, and finally Pleurx catheter insertion. I reviewed with her the indications and risks of all these techniques. I explained her the advantages and disadvantages of them. After an extensive discussion with the patient shoe does not feel that she would like proceed with any intervention at this point in time. She believes that noncompliance may have contributed to her current situation and she would like to reassess as an outpatient. I explained her that I would be happy to see her as an outpatient if she desires.       Nestor Lewandowsky, MD 03/23/2016, 8:57 AM

## 2016-03-23 NOTE — Care Management Important Message (Signed)
Important Message  Patient Details  Name: Elaine Decker MRN: [id number] Date of Birth: 1946/04/15   Medicare Important Message Given:  Yes    Jolly Mango, RN 03/23/2016, 9:35 AM

## 2016-03-24 MED ORDER — DIGOXIN 0.25 MG/ML IJ SOLN
0.2500 mg | Freq: Four times a day (QID) | INTRAMUSCULAR | Status: AC
  Administered 2016-03-24 – 2016-03-25 (×3): 0.25 mg via INTRAVENOUS
  Filled 2016-03-24 (×3): qty 1

## 2016-03-24 MED ORDER — DIGOXIN 250 MCG PO TABS
0.2500 mg | ORAL_TABLET | Freq: Every day | ORAL | Status: DC
  Administered 2016-03-25 – 2016-03-26 (×2): 0.25 mg via ORAL
  Filled 2016-03-24 (×2): qty 1

## 2016-03-24 MED ORDER — DIGOXIN 0.25 MG/ML IJ SOLN: 0.2500 mg | Freq: Once | INTRAMUSCULAR | Status: DC

## 2016-03-24 NOTE — Progress Notes (Addendum)
[facility] [place] NAME: Elaine Decker    MR#:  [id number]  DATE OF BIRTH:  1946/04/19  SUBJECTIVE:  CHIEF COMPLAINT:   Chief Complaint  Patient presents with  . Shortness of Breath     - still remains in atrial flutter- HR elevated at 130 range - diarrhea improved, has c.diff. On 3l o2- has home o2  REVIEW OF SYSTEMS:   Review of Systems  Constitutional: Negative for fever and chills.  HENT: Negative for ear discharge and ear pain.   Eyes: Negative for blurred vision and double vision.  Respiratory: Positive for cough and shortness of breath. Negative for wheezing.   Cardiovascular: Positive for leg swelling. Negative for chest pain and palpitations.  Gastrointestinal: Negative for nausea, vomiting, abdominal pain, diarrhea and constipation.  Genitourinary: Negative for dysuria and urgency.  Musculoskeletal: Negative for myalgias.  Neurological: Positive for weakness. Negative for dizziness, sensory change, speech change, seizures and headaches.  Psychiatric/Behavioral: Negative for depression.    DRUG ALLERGIES:  No Known Allergies  VITALS:  Blood pressure 101/75, pulse 144, temperature 98.1 F (36.7 C), temperature source Oral, resp. rate 15, height 5\' 3"  (1.6 m), weight 75.054 kg (165 lb 7.4 oz), SpO2 94 %.  PHYSICAL EXAMINATION:  GENERAL:  70 y.o.-year-old patient lying in the bed with no acute distress.  EYES: Pupils equal, round, reactive to light and accommodation. No scleral icterus. Extraocular muscles intact.  HEENT: Head atraumatic, normocephalic. Oropharynx and nasopharynx clear.  NECK:  Supple, no jugular venous distention. No thyroid enlargement, no tenderness.  LUNGS: Normal breath sounds bilaterally, no wheezing, some crepitation. No use of accessory muscles of respiration. Decreased bibasilar breath sounds CARDIOVASCULAR: S1, S2 irregular, tachycardia. No murmurs, rubs, or gallops.  ABDOMEN: Soft,  nontender, nondistended. Bowel sounds present. No organomegaly or mass.  EXTREMITIES: No pedal edema, cyanosis, or clubbing.  NEUROLOGIC: Cranial nerves II through XII are intact. Muscle strength 5/5 in all extremities. Sensation intact. Gait not checked.  PSYCHIATRIC: The patient is alert and oriented x 3.  SKIN: No obvious rash, lesion, or ulcer.   Physical Exam LABORATORY PANEL:   CBC  Recent Labs Lab 03/23/16 0642  WBC 12.2*  HGB 12.3  HCT 38.5  PLT 244   ------------------------------------------------------------------------------------------------------------------  Chemistries   Recent Labs Lab 03/18/16 2142 03/19/16 0835  03/22/16 0435  NA 142 143  < > 140  K 3.8 3.0*  < > 3.8  CL 100* 93*  < > 90*  CO2 36* 47*  < > 46*  GLUCOSE 133* 103*  < > 123*  BUN 18 14  < > 9  CREATININE 0.69 0.59  < > 0.73  CALCIUM 9.0 8.8*  < > 9.0  MG  --  1.5*  --   --   AST 21  --   --   --   ALT 22  --   --   --   ALKPHOS 68  --   --   --   BILITOT 0.9  --   --   --   < > = values in this interval not displayed. ------------------------------------------------------------------------------------------------------------------  Cardiac Enzymes  Recent Labs Lab 03/19/16 0835 03/19/16 1443  TROPONINI 0.09* 0.05*   ------------------------------------------------------------------------------------------------------------------  RADIOLOGY:  No results found.  ASSESSMENT AND PLAN:   Active Problems:   Atrial flutter (HCC)   Pleural effusion  1. Decompensated heart failure- Ac on Ch systolic     EF is 25- [id number] on  recent Echo.   Spironolactone.  cont  lasix, cardio consult.  2. Atrial flutter with RVR     On oral metoprolol and cardizem, increased does with still elevated heart rate - with low EF and normal renal function- load with digoxin today and oral digoxin tomorrow.     On pradaxa for anticoagulation.     appreiciate cardio consult.  3. Acute respiratory  distress secondary to acute on chronic systolic heart failure exacerbation    Supplemental oxygen, lasix   Also have pleural effusion, thorocentesis showes transudate 03/21/16.    On home O2. Appreciate cards consult, ECHO with EF 30%  4. Hypertension     Metoprolol, lisinopril, spironolactone.  5. Hyperlipidemia     Statin  7. Recurrent right pleural effusion. Transudate- due to CHF   S/p tap this admission and about 800cc drained    Pulm consult with Dr. Humphrey Rolls, still awaited.   Patient refused further procedures including pleurodesis and recurrent tap at this time. - cont medical mgmt  8. c diff - cont oral vanc for 10 days   All the records are reviewed and case discussed with Care Management/Social Workerr. Management plans discussed with the patient, family and they are in agreement.  CODE STATUS: Full.    TOTAL TIME TAKING CARE OF THIS PATIENT: 37 minutes.   POSSIBLE D/C IN 2-3 DAYS, DEPENDING ON CLINICAL CONDITION.   Gladstone Lighter M.D on 03/24/2016   Between 7am to 6pm - Pager - [phone]  After 6pm go to [email or web address] - password [email or web address] [facility]  Office  [phone]  CC: Primary care physician; Glendon Axe, MD  Note: This dictation was prepared with Dragon dictation along with smaller phrase technology. Any transcriptional errors that result from this process are unintentional.

## 2016-03-25 LAB — DIGOXIN LEVEL: Digoxin Level: 1.3 ng/mL (ref 0.8–2.0)

## 2016-03-25 LAB — BASIC METABOLIC PANEL
Anion gap: 3 — ABNORMAL LOW (ref 5–15)
BUN: 23 mg/dL — AB (ref 6–20)
CALCIUM: 9 mg/dL (ref 8.9–10.3)
CHLORIDE: 94 mmol/L — AB (ref 101–111)
CO2: 39 mmol/L — AB (ref 22–32)
CREATININE: 0.77 mg/dL (ref 0.44–1.00)
GFR calc non Af Amer: >60 mL/min (ref >60)
Glucose, Bld: 125 mg/dL — ABNORMAL HIGH (ref 65–99)
Potassium: 4.7 mmol/L (ref 3.5–5.1)
Sodium: 136 mmol/L (ref 135–145)

## 2016-03-25 NOTE — Progress Notes (Signed)
[facility] [place] NAME: Elaine Decker    MR#:  [id number]  DATE OF BIRTH:  [birth year]  SUBJECTIVE:  CHIEF COMPLAINT:   Chief Complaint  Patient presents with  . Shortness of Breath     - still remains in atrial flutter- HR elevated at 130 range - diarrhea improved, has c.diff. On 3l o2- has home o2 - digoxin added yesterday with not much improvement in HR  REVIEW OF SYSTEMS:   Review of Systems  Constitutional: Negative for fever and chills.  HENT: Negative for ear discharge and ear pain.   Eyes: Negative for blurred vision and double vision.  Respiratory: Positive for cough and shortness of breath. Negative for wheezing.        Improving shortness of breath  Cardiovascular: Positive for leg swelling. Negative for chest pain and palpitations.  Gastrointestinal: Negative for nausea, vomiting, abdominal pain, diarrhea and constipation.  Genitourinary: Negative for dysuria and urgency.  Musculoskeletal: Negative for myalgias.  Neurological: Positive for weakness. Negative for dizziness, sensory change, speech change, seizures and headaches.  Psychiatric/Behavioral: Negative for depression.    DRUG ALLERGIES:  No Known Allergies  VITALS:  Blood pressure 101/61, pulse 74, temperature 97.8 F (36.6 C), temperature source Oral, resp. rate 22, height 5\' 3"  (1.6 m), weight 74.684 kg (164 lb 10.4 oz), SpO2 98 %.  PHYSICAL EXAMINATION:  GENERAL:  70 y.o.-year-old patient lying in the bed with no acute distress.  EYES: Pupils equal, round, reactive to light and accommodation. No scleral icterus. Extraocular muscles intact.  HEENT: Head atraumatic, normocephalic. Oropharynx and nasopharynx clear.  NECK:  Supple, no jugular venous distention. No thyroid enlargement, no tenderness.  LUNGS: Normal breath sounds bilaterally, no wheezing, some crepitation. No use of accessory muscles of respiration. Decreased bibasilar breath  sounds CARDIOVASCULAR: S1, S2 irregular, tachycardia. No murmurs, rubs, or gallops.  ABDOMEN: Soft, nontender, nondistended. Bowel sounds present. No organomegaly or mass.  EXTREMITIES: No pedal edema, cyanosis, or clubbing.  NEUROLOGIC: Cranial nerves II through XII are intact. Muscle strength 5/5 in all extremities. Sensation intact. Gait not checked.  PSYCHIATRIC: The patient is alert and oriented x 3.  SKIN: No obvious rash, lesion, or ulcer.   Physical Exam LABORATORY PANEL:   CBC  Recent Labs Lab 03/23/16 0642  WBC 12.2*  HGB 12.3  HCT 38.5  PLT 244   ------------------------------------------------------------------------------------------------------------------  Chemistries   Recent Labs Lab 03/18/16 2142 03/19/16 0835  03/25/16 0454  NA 142 143  < > 136  K 3.8 3.0*  < > 4.7  CL 100* 93*  < > 94*  CO2 36* 47*  < > 39*  GLUCOSE 133* 103*  < > 125*  BUN 18 14  < > 23*  CREATININE 0.69 0.59  < > 0.77  CALCIUM 9.0 8.8*  < > 9.0  MG  --  1.5*  --   --   AST 21  --   --   --   ALT 22  --   --   --   ALKPHOS 68  --   --   --   BILITOT 0.9  --   --   --   < > = values in this interval not displayed. ------------------------------------------------------------------------------------------------------------------  Cardiac Enzymes  Recent Labs Lab 03/19/16 0835 03/19/16 1443  TROPONINI 0.09* 0.05*   ------------------------------------------------------------------------------------------------------------------  RADIOLOGY:  No results found.  ASSESSMENT AND PLAN:   Active Problems:   Atrial flutter ([place])  Pleural effusion  1. Decompensated heart failure- Ac on Ch systolic     EF is 25- [id number] on recent Echo.   Spironolactone.  cont  lasix, cardio consult.  2. Atrial flutter with RVR     On oral metoprolol and cardizem, with still elevated heart rate - with low EF and normal renal function- loaded with digoxin yesterday and oral digoxin started  today but HR still elevated - reconsult cardiology today.     On pradaxa for anticoagulation.  3. Acute respiratory distress secondary to acute on chronic systolic heart failure exacerbation - presenting with hypoxia on admission    Supplemental oxygen, lasix   Also have pleural effusion, thorocentesis shows transudate 03/21/16.    On home O2. Appreciate cards consult, ECHO with EF 30%  4. Hypertension     Metoprolol, lisinopril, spironolactone.  5. Hyperlipidemia     Statin  7. Recurrent right pleural effusion. Transudate- due to CHF   S/p tap this admission and about 800cc drained    Pulm consult with Dr. Humphrey Rolls, still awaited.   Patient refused further procedures including pleurodesis and recurrent tap at this time. - cont medical mgmt  8. c diff - cont oral vanc for 10 days   All the records are reviewed and case discussed with Care Management/Social Workerr. Management plans discussed with the patient, family and they are in agreement.  CODE STATUS: Full.  TOTAL TIME TAKING CARE OF THIS PATIENT: 40 minutes.   POSSIBLE D/C IN 2-3 DAYS, DEPENDING ON CLINICAL CONDITION.   Gladstone Lighter M.D on 03/25/2016   Between 7am to 6pm - Pager - [phone]  After 6pm go to [email or web address] - password [email or web address] [facility]  Office  [phone]  CC: Primary care physician; Glendon Axe, MD  Note: This dictation was prepared with Dragon dictation along with smaller phrase technology. Any transcriptional errors that result from this process are unintentional.

## 2016-03-25 NOTE — Progress Notes (Signed)
Pt. Refuses to have IV removed and a new one started. Iv site is clean, dry and intact with mild redness at insertion site. Resident educated on the need to rotate IV sites to prevent pain, infection. She stated she understood but still did not want to rotate site. Will continue to monitor. Pt believes she is going home today, stated if she does not then the site can be changed this afternoon.

## 2016-03-26 LAB — BASIC METABOLIC PANEL
ANION GAP: 4 — AB (ref 5–15)
BUN: 24 mg/dL — ABNORMAL HIGH (ref 6–20)
CALCIUM: 9.1 mg/dL (ref 8.9–10.3)
CO2: 39 mmol/L — ABNORMAL HIGH (ref 22–32)
Chloride: 96 mmol/L — ABNORMAL LOW (ref 101–111)
Creatinine, Ser: 0.86 mg/dL (ref 0.44–1.00)
Glucose, Bld: 128 mg/dL — ABNORMAL HIGH (ref 65–99)
POTASSIUM: 5.1 mmol/L (ref 3.5–5.1)
SODIUM: 139 mmol/L (ref 135–145)

## 2016-03-26 LAB — CBC
HCT: 40.8 % (ref 35.0–47.0)
Hemoglobin: 13 g/dL (ref 12.0–16.0)
MCH: 30 pg (ref 26.0–34.0)
MCHC: 32 g/dL (ref 32.0–36.0)
MCV: 93.8 fL (ref 80.0–100.0)
PLATELETS: 270 10*3/uL (ref 150–440)
RBC: 4.35 MIL/uL (ref 3.80–5.20)
RDW: 16.2 % — ABNORMAL HIGH (ref 11.5–14.5)
WBC: 12.5 10*3/uL — AB (ref 3.6–11.0)

## 2016-03-26 LAB — MAGNESIUM: MAGNESIUM: 2 mg/dL (ref 1.7–2.4)

## 2016-03-26 MED ORDER — FUROSEMIDE 40 MG PO TABS: 40.0000 mg | ORAL_TABLET | Freq: Every day | ORAL | Status: DC

## 2016-03-26 MED ORDER — IPRATROPIUM-ALBUTEROL 0.5-2.5 (3) MG/3ML IN SOLN: 3.0000 mL | Freq: Four times a day (QID) | RESPIRATORY_TRACT | Status: DC | PRN

## 2016-03-26 MED ORDER — METOPROLOL TARTRATE 100 MG PO TABS: 100.0000 mg | ORAL_TABLET | Freq: Two times a day (BID) | ORAL | Status: DC

## 2016-03-26 MED ORDER — DIGOXIN 250 MCG PO TABS: 0.2500 mg | ORAL_TABLET | Freq: Every day | ORAL | Status: DC

## 2016-03-26 MED ORDER — VANCOMYCIN 50 MG/ML ORAL SOLUTION: 125.0000 mg | Freq: Four times a day (QID) | ORAL | Status: DC

## 2016-03-26 MED ORDER — LISINOPRIL 5 MG PO TABS: 5.0000 mg | ORAL_TABLET | Freq: Every day | ORAL | Status: DC

## 2016-03-26 NOTE — Discharge Summary (Signed)
[facility] [place] NAME: Elaine Decker    MR#:  [id number]  DATE OF BIRTH:  [birth year]  DATE OF ADMISSION:  03/18/2016 ADMITTING PHYSICIAN: Saundra Shelling, MD  DATE OF DISCHARGE: 03/26/2016 12:30 PM  PRIMARY CARE PHYSICIAN: Singh,Jasmine, MD    ADMISSION DIAGNOSIS:  SOB (shortness of breath) [R06.02] Atrial flutter, unspecified type ([place]) [I48.92]  DISCHARGE DIAGNOSIS:  Active Problems:   Atrial flutter ([facility])   Pleural effusion   SECONDARY DIAGNOSIS:   Past Medical History  Diagnosis Date  . COPD (chronic obstructive pulmonary disease) ([place])   . Hypertension   . Asthma   . CHF (congestive heart failure) ([place])   . A-fib ([place])   . CKD (chronic kidney disease)   . HLD (hyperlipidemia)     HOSPITAL COURSE:   Active Problems:  Atrial flutter ([facility])  Pleural effusion  1. Atrial flutter with RVR-very difficult to control heart rate.  On oral metoprolol and cardizem,started on oral digoxin after IV loading. - much improved heart rate today. At rest heart rate is still less than 100, with activity going up to 130s. Metoprolol dose increased as recommended by her cardiologist today. He will follow-up with the patient in a week  On pradaxa for anticoagulation.  2. Acute hypoxic  respiratory failure- secondary to acute on chronic systolic heart failure exacerbation - presenting with hypoxia on admission  Supplemental oxygen- on 3l o2 at discharge, continue lasix  Also have pleural effusion, thorocentesis shows transudate 03/21/16. Appreciate cards consult, ECHO with EF 30% -salt restriction, daily weights, being compliant with medications and fluid restriction explained to patient. -being discharged on metoprolol. Lisinopril dose decreased due to low normal blood pressure. Also Aldactone held for the same reason. Meds can be adjusted as outpatient.  3. Hypertension  Metoprolol, lisinopril, cardizem  4  Hyperlipidemia  Statin  5. Recurrent right pleural effusion. Transudate- due to CHF  S/p tap this admission and about 800cc drained  Pulm f/u as outpatient  Patient refused further procedures including pleurodesis and recurrent tap at this time. - cont medical mgmt  6. c diff - cont oral vanc for 10 days   Patient being discharged home today  DISCHARGE CONDITIONS:   Guarded  CONSULTS OBTAINED:  Treatment Team:  Teodoro Spray, MD  DRUG ALLERGIES:  No Known Allergies  DISCHARGE MEDICATIONS:   Discharge Medication List as of 03/26/2016 11:06 AM    START taking these medications   Details  digoxin (LANOXIN) 0.25 MG tablet Take 1 tablet (0.25 mg total) by mouth daily., Starting 03/26/2016, Until Discontinued, Normal    metoprolol (LOPRESSOR) 100 MG tablet Take 1 tablet (100 mg total) by mouth 2 (two) times daily., Starting 03/26/2016, Until Discontinued, Normal    vancomycin (VANCOCIN) 50 mg/mL oral solution Take 2.5 mLs (125 mg total) by mouth every 6 (six) hours. X 6 more days, Starting 03/26/2016, Until Discontinued, Normal      CONTINUE these medications which have CHANGED   Details  furosemide (LASIX) 40 MG tablet Take 1 tablet (40 mg total) by mouth daily., Starting 03/26/2016, Until Discontinued, Normal    lisinopril (PRINIVIL,ZESTRIL) 5 MG tablet Take 1 tablet (5 mg total) by mouth daily., Starting 03/26/2016, Until Discontinued, Normal      CONTINUE these medications which have NOT CHANGED   Details  acetaminophen (TYLENOL) 325 MG tablet Take 325-650 mg by mouth at bedtime., Until Discontinued, Historical Med    albuterol (PROVENTIL HFA;VENTOLIN HFA) 108 (90 Base)  MCG/ACT inhaler Inhale 2 puffs into the lungs every 4 (four) hours as needed for wheezing or shortness of breath., Until Discontinued, Historical Med    cholecalciferol (VITAMIN D) 1000 UNITS tablet Take 1,000 Units by mouth daily., Until Discontinued, Historical Med    dabigatran (PRADAXA) 150 MG  CAPS capsule Take 150 mg by mouth 2 (two) times daily., Until Discontinued, Historical Med    diltiazem (DILACOR XR) 240 MG 24 hr capsule Take 240 mg by mouth daily., Until Discontinued, Historical Med    Fluticasone-Salmeterol (ADVAIR) 250-50 MCG/DOSE AEPB Inhale 1 puff into the lungs 2 (two) times daily., Until Discontinued, Historical Med    ipratropium-albuterol (DUONEB) 0.5-2.5 (3) MG/3ML SOLN Take 3 mLs by nebulization every 6 (six) hours., Starting 11/30/2015, Until Discontinued, Normal    montelukast (SINGULAIR) 10 MG tablet Take 10 mg by mouth at bedtime. , Until Discontinued, Historical Med    Multiple Vitamin (MULTIVITAMIN) tablet Take 1 tablet by mouth daily., Until Discontinued, Historical Med    tiotropium (SPIRIVA) 18 MCG inhalation capsule Place 18 mcg into inhaler and inhale daily., Until Discontinued, Historical Med    clindamycin (CLEOCIN) 300 MG capsule Take 300 mg by mouth 3 (three) times daily., Until Discontinued, Historical Med      STOP taking these medications     atorvastatin (LIPITOR) 20 MG tablet      propranolol (INDERAL) 40 MG tablet      spironolactone (ALDACTONE) 25 MG tablet          DISCHARGE INSTRUCTIONS:   1. Cardiology f/u in 1 week 2. Pulmonary f/u in 2 weeks 3. PCP f/u in 1-2 weeks  If you experience worsening of your admission symptoms, develop shortness of breath, life threatening emergency, suicidal or homicidal thoughts you must seek medical attention immediately by calling 911 or calling your MD immediately  if symptoms less severe.  You Must read complete instructions/literature along with all the possible adverse reactions/side effects for all the Medicines you take and that have been prescribed to you. Take any new Medicines after you have completely understood and accept all the possible adverse reactions/side effects.   Please note  You were cared for by a hospitalist during your hospital stay. If you have any questions about  your discharge medications or the care you received while you were in the hospital after you are discharged, you can call the unit and asked to speak with the hospitalist on call if the hospitalist that took care of you is not available. Once you are discharged, your primary care physician will handle any further medical issues. Please note that NO REFILLS for any discharge medications will be authorized once you are discharged, as it is imperative that you return to your primary care physician (or establish a relationship with a primary care physician if you do not have one) for your aftercare needs so that they can reassess your need for medications and monitor your lab values.    Today   CHIEF COMPLAINT:   Chief Complaint  Patient presents with  . Shortness of Breath    VITAL SIGNS:  Blood pressure 109/48, pulse 140, temperature 97.9 F (36.6 C), temperature source Oral, resp. rate 20, height 5\' 3"  (1.6 m), weight 72.576 kg (160 lb), SpO2 95 %.  I/O:   Intake/Output Summary (Last 24 hours) at 03/26/16 1619 Last data filed at 03/26/16 0744  Gross per 24 hour  Intake      0 ml  Output      0  ml  Net      0 ml    PHYSICAL EXAMINATION:   Physical Exam  GENERAL: 70 y.o.-year-old patient lying in the bed with no acute distress.  EYES: Pupils equal, round, reactive to light and accommodation. No scleral icterus. Extraocular muscles intact.  HEENT: Head atraumatic, normocephalic. Oropharynx and nasopharynx clear.  NECK: Supple, no jugular venous distention. No thyroid enlargement, no tenderness.  LUNGS: Normal breath sounds bilaterally, no wheezing, some crepitation. No use of accessory muscles of respiration. Decreased bibasilar breath sounds CARDIOVASCULAR: S1, S2 irregular, tachycardia. No murmurs, rubs, or gallops.  ABDOMEN: Soft, nontender, nondistended. Bowel sounds present. No organomegaly or mass.  EXTREMITIES: No pedal edema, cyanosis, or clubbing.  NEUROLOGIC:  Cranial nerves II through XII are intact. Muscle strength 5/5 in all extremities. Sensation intact. Gait not checked.  PSYCHIATRIC: The patient is alert and oriented x 3.  SKIN: No obvious rash, lesion, or ulcer.   DATA REVIEW:   CBC  Recent Labs Lab 03/26/16 0430  WBC 12.5*  HGB 13.0  HCT 40.8  PLT 270    Chemistries   Recent Labs Lab 03/26/16 0430  NA 139  K 5.1  CL 96*  CO2 39*  GLUCOSE 128*  BUN 24*  CREATININE 0.86  CALCIUM 9.1  MG 2.0    Cardiac Enzymes No results for input(s): TROPONINI in the last 168 hours.  Microbiology Results  Results for orders placed or performed during the hospital encounter of 03/18/16  MRSA PCR Screening     Status: None   Collection Time: 03/19/16  3:52 AM  Result Value Ref Range Status   MRSA by PCR NEGATIVE NEGATIVE Final    Comment:        The GeneXpert MRSA Assay (FDA approved for NASAL specimens only), is one component of a comprehensive MRSA colonization surveillance program. It is not intended to diagnose MRSA infection nor to guide or monitor treatment for MRSA infections.   C difficile quick scan w PCR reflex     Status: Abnormal   Collection Time: 03/19/16  8:10 PM  Result Value Ref Range Status   C Diff antigen POSITIVE (A) NEGATIVE Final   C Diff toxin POSITIVE (A) NEGATIVE Final   C Diff interpretation   Final    Positive for toxigenic C. difficile, active toxin production present.    Comment: CRITICAL RESULT CALLED TO, READ BACK BY AND VERIFIED WITH: [facility] WHITE AT 2142 03/19/16 KLK     RADIOLOGY:  No results found.  EKG:   Orders placed or performed during the hospital encounter of 03/18/16  . EKG 12-Lead  . EKG 12-Lead      Management plans discussed with the patient, family and they are in agreement.  CODE STATUS:  Code Status History    Date Active Date Inactive Code Status Order ID Comments User Context   03/19/2016  2:41 AM 03/26/2016  3:56 PM Full Code [id number]  Saundra Shelling, MD  Inpatient   01/19/2016  9:13 PM 01/24/2016  7:21 PM Full Code [id number]  Lance Coon, MD Inpatient   11/28/2015 11:41 AM 11/30/2015  5:51 PM Full Code [id number]  Loletha Grayer, MD ED      TOTAL TIME TAKING CARE OF THIS PATIENT: 37 minutes.    Gladstone Lighter M.D on 03/26/2016 at 4:19 PM  Between 7am to 6pm - Pager - [phone]  After 6pm go to [email or web address] - password [email or web address] [facility]  [facility]  Office  [phone]  CC: Primary care physician; Singh,Jasmine,  MD     

## 2016-03-26 NOTE — Care Management Important Message (Signed)
Important Message  Patient Details  Name: MAIRLYN PUCCINELLI MRN: [id number] Date of Birth: 1946/10/03   Medicare Important Message Given:  Yes    Jolly Mango, RN 03/26/2016, 12:21 PM

## 2016-03-26 NOTE — Progress Notes (Signed)
Spoke with DR. Tressia Miners regarding questions about wether she could take a pill form rather than liquid of the vancomycin. Pharmacy notified. Patient notified

## 2016-03-26 NOTE — Progress Notes (Signed)
Patient discharged via wheelchair and private vehicle. IV removed and catheter intact. All discharge instructions given and patient verbalizes understanding. Tele removed and returned. No prescriptions given to patient No distress noted.   

## 2016-04-04 ENCOUNTER — Encounter: Payer: Self-pay | Admitting: Family

## 2016-04-04 ENCOUNTER — Ambulatory Visit: Payer: BLUE CROSS/BLUE SHIELD | Attending: Family | Admitting: Family

## 2016-04-04 VITALS — BP 121/54 | HR 63 | Resp 20 | Ht 63.0 in | Wt 158.0 lb

## 2016-04-04 DIAGNOSIS — Z8249 Family history of ischemic heart disease and other diseases of the circulatory system: Secondary | ICD-10-CM | POA: Insufficient documentation

## 2016-04-04 DIAGNOSIS — I4891 Unspecified atrial fibrillation: Secondary | ICD-10-CM | POA: Insufficient documentation

## 2016-04-04 DIAGNOSIS — R5383 Other fatigue: Secondary | ICD-10-CM | POA: Insufficient documentation

## 2016-04-04 DIAGNOSIS — J449 Chronic obstructive pulmonary disease, unspecified: Secondary | ICD-10-CM | POA: Insufficient documentation

## 2016-04-04 DIAGNOSIS — I110 Hypertensive heart disease with heart failure: Secondary | ICD-10-CM | POA: Diagnosis not present

## 2016-04-04 DIAGNOSIS — I480 Paroxysmal atrial fibrillation: Secondary | ICD-10-CM

## 2016-04-04 DIAGNOSIS — Z87891 Personal history of nicotine dependence: Secondary | ICD-10-CM | POA: Diagnosis not present

## 2016-04-04 DIAGNOSIS — I5022 Chronic systolic (congestive) heart failure: Secondary | ICD-10-CM | POA: Insufficient documentation

## 2016-04-04 DIAGNOSIS — Z7901 Long term (current) use of anticoagulants: Secondary | ICD-10-CM | POA: Diagnosis not present

## 2016-04-04 DIAGNOSIS — Z801 Family history of malignant neoplasm of trachea, bronchus and lung: Secondary | ICD-10-CM | POA: Insufficient documentation

## 2016-04-04 DIAGNOSIS — N189 Chronic kidney disease, unspecified: Secondary | ICD-10-CM | POA: Insufficient documentation

## 2016-04-04 DIAGNOSIS — I129 Hypertensive chronic kidney disease with stage 1 through stage 4 chronic kidney disease, or unspecified chronic kidney disease: Secondary | ICD-10-CM | POA: Diagnosis not present

## 2016-04-04 DIAGNOSIS — Z79899 Other long term (current) drug therapy: Secondary | ICD-10-CM | POA: Insufficient documentation

## 2016-04-04 DIAGNOSIS — E785 Hyperlipidemia, unspecified: Secondary | ICD-10-CM | POA: Insufficient documentation

## 2016-04-04 DIAGNOSIS — Z7951 Long term (current) use of inhaled steroids: Secondary | ICD-10-CM | POA: Diagnosis not present

## 2016-04-04 DIAGNOSIS — R0602 Shortness of breath: Secondary | ICD-10-CM | POA: Insufficient documentation

## 2016-04-04 DIAGNOSIS — G4733 Obstructive sleep apnea (adult) (pediatric): Secondary | ICD-10-CM | POA: Insufficient documentation

## 2016-04-04 DIAGNOSIS — J45909 Unspecified asthma, uncomplicated: Secondary | ICD-10-CM | POA: Diagnosis not present

## 2016-04-04 DIAGNOSIS — I10 Essential (primary) hypertension: Secondary | ICD-10-CM

## 2016-04-04 DIAGNOSIS — J4489 Other specified chronic obstructive pulmonary disease: Secondary | ICD-10-CM | POA: Insufficient documentation

## 2016-04-04 NOTE — Patient Instructions (Signed)
Begin weighing daily and call for an overnight weight gain of > 2 pounds or a weekly weight gain of >5 pounds. 

## 2016-04-04 NOTE — Progress Notes (Signed)
Subjective:    Patient ID: Elaine Decker, female    DOB: 23-Nov-1946, 70 y.o.   MRN: [id number]  Congestive Heart Failure Presents for initial visit. The disease course has been improving. Associated symptoms include fatigue and shortness of breath (mild). Pertinent negatives include no abdominal pain, chest pain, chest pressure, edema, orthopnea or palpitations. The symptoms have been improving. Past treatments include beta blockers, ACE inhibitors, oxygen and salt and fluid restriction. The treatment provided moderate relief. Compliance with prior treatments has been good. Her past medical history is significant for arrhythmia, chronic lung disease and HTN. There is no history of CVA or DM. She has one 1st degree relative with heart disease.  Hypertension This is a chronic problem. The current episode started more than 1 year ago. The problem is controlled. Associated symptoms include shortness of breath (mild). Pertinent negatives include no chest pain, headaches, neck pain, palpitations or peripheral edema. There are no associated agents to hypertension. Risk factors for coronary artery disease include family history, post-menopausal state, smoking/tobacco exposure and dyslipidemia. Past treatments include ACE inhibitors, beta blockers, calcium channel blockers, diuretics and lifestyle changes. The current treatment provides moderate improvement. Compliance problems include exercise.  Hypertensive end-organ damage includes heart failure. There is no history of CAD/MI or CVA.   Past Medical History  Diagnosis Date  . COPD (chronic obstructive pulmonary disease) ([place])   . Hypertension   . Asthma   . CHF (congestive heart failure) ([place])   . A-fib ([place])   . CKD (chronic kidney disease)   . HLD (hyperlipidemia)     Past Surgical History  Procedure Laterality Date  . Surgery on the neck    . Flexible bronchoscopy N/A 02/01/2016    Procedure: FLEXIBLE BRONCHOSCOPY;  Surgeon: Allyne Gee, MD;   Location: [facility] ORS;  Service: Pulmonary;  Laterality: N/A;    Family History  Problem Relation Age of Onset  . CAD Mother   . Lung cancer Father   . Arrhythmia Brother     Social History  Substance Use Topics  . Smoking status: Former Smoker -- 2.00 packs/day for 50 years    Quit date: 11/27/2015  . Smokeless tobacco: Never Used  . Alcohol Use: No    No Known Allergies  Prior to Admission medications   Medication Sig Start Date End Date Taking? Authorizing Provider  acetaminophen (TYLENOL) 325 MG tablet Take 325-650 mg by mouth at bedtime.   Yes Historical Provider, MD  albuterol (PROVENTIL HFA;VENTOLIN HFA) 108 (90 Base) MCG/ACT inhaler Inhale 2 puffs into the lungs every 4 (four) hours as needed for wheezing or shortness of breath.   Yes Historical Provider, MD  atorvastatin (LIPITOR) 20 MG tablet Take 20 mg by mouth daily.   Yes Historical Provider, MD  cholecalciferol (VITAMIN D) 1000 UNITS tablet Take 1,000 Units by mouth daily.   Yes Historical Provider, MD  dabigatran (PRADAXA) 150 MG CAPS capsule Take 150 mg by mouth 2 (two) times daily.   Yes Historical Provider, MD  digoxin (LANOXIN) 0.25 MG tablet Take 1 tablet (0.25 mg total) by mouth daily. 03/26/16  Yes Gladstone Lighter, MD  diltiazem (DILACOR XR) 240 MG 24 hr capsule Take 240 mg by mouth daily.   Yes Historical Provider, MD  Fluticasone-Salmeterol (ADVAIR) 250-50 MCG/DOSE AEPB Inhale 1 puff into the lungs 2 (two) times daily.   Yes Historical Provider, MD  furosemide (LASIX) 40 MG tablet Take 1 tablet (40 mg total) by mouth daily. 03/26/16  Yes Gladstone Lighter,  MD  lisinopril (PRINIVIL,ZESTRIL) 5 MG tablet Take 1 tablet (5 mg total) by mouth daily. 03/26/16  Yes Gladstone Lighter, MD  metoprolol (LOPRESSOR) 100 MG tablet Take 1 tablet (100 mg total) by mouth 2 (two) times daily. Patient taking differently: Take 100 mg by mouth daily.  03/26/16  Yes Gladstone Lighter, MD  montelukast (SINGULAIR) 10 MG tablet Take 10 mg  by mouth at bedtime.    Yes Historical Provider, MD  Multiple Vitamin (MULTIVITAMIN) tablet Take 1 tablet by mouth daily.   Yes Historical Provider, MD  tiotropium (SPIRIVA) 18 MCG inhalation capsule Place 18 mcg into inhaler and inhale daily.   Yes Historical Provider, MD  ipratropium-albuterol (DUONEB) 0.5-2.5 (3) MG/3ML SOLN Take 3 mLs by nebulization every 6 (six) hours. Patient not taking: Reported on 04/04/2016 11/30/15   Fritzi Mandes, MD      Review of Systems  Constitutional: Positive for fatigue. Negative for appetite change.  HENT: Positive for rhinorrhea. Negative for congestion and sore throat.   Eyes: Negative.   Respiratory: Positive for shortness of breath (mild). Negative for cough, chest tightness and wheezing.   Cardiovascular: Negative for chest pain, palpitations and leg swelling.  Gastrointestinal: Negative for abdominal pain and abdominal distention.  Endocrine: Negative.   Genitourinary: Negative.   Musculoskeletal: Negative for back pain and neck pain.  Skin: Negative.   Allergic/Immunologic: Negative.   Neurological: Positive for dizziness. Negative for light-headedness and headaches.  Hematological: Negative for adenopathy. Bruises/bleeds easily.  Psychiatric/Behavioral: Negative for sleep disturbance (sleeping with CPAP nightly along with oxygen at 2L) and dysphoric mood. The patient is not nervous/anxious.        Objective:   Physical Exam  Constitutional: She is oriented to person, place, and time. She appears well-developed and well-nourished.  HENT:  Head: Normocephalic and atraumatic.  Eyes: Conjunctivae are normal. Pupils are equal, round, and reactive to light.  Neck: Normal range of motion. Neck supple.  Cardiovascular: Regular rhythm.  Bradycardia present.   Pulmonary/Chest: Effort normal. She has no wheezes. She has no rales.  Abdominal: Soft. She exhibits no distension. There is no tenderness.  Musculoskeletal: She exhibits no edema or  tenderness.  Neurological: She is alert and oriented to person, place, and time.  Skin: Skin is warm and dry.  Psychiatric: She has a normal mood and affect. Her behavior is normal. Thought content normal.  Nursing note and vitals reviewed.   BP 121/54 mmHg  Pulse 63  Resp 20  Ht 5\' 3"  (1.6 m)  Wt 158 lb (71.668 kg)  BMI 28.00 kg/m2  SpO2 91%       Assessment & Plan:  1:Chronic heart failure with reduced ejection fraction- Patient presents with fatigue and a mild amount of shortness of breath upon exertion. She says that she was a little tired upon walking into the office today but denied having any shortness of breath. She denies any swelling in her legs or abdomen. She hasn't been weighing herself at home but says that she does have scales at home. Discussed the importance of weighing herself daily first thing in the morning after using the bathroom and to call for an overnight weight gain of >2 pounds or a weekly weight gain of >5 pounds. She is not adding any salt to her food and does review food labels. Discussed the importance of following a 2000mg  sodium diet and written information was given to her about this. Discussed possibly changing her lisinopril to entresto but she would like to wait since  she's starting to feel better. Her medication list shows toprol xl as once daily although her discharge list shows it as twice daily. Patient is unsure if she's taking it 1-2 times a days and she says that she'll go home and check and call us back with directions.  2: HTN- Blood pressure looks good today. Continue medications at this time. 3: Atrial fibrillation- Currently rate controlled on metoprolol, digoxin and diltiazem. Just recently saw her cardiologist. 4: COPD- She does use inhalers and has a nebulizer at home that she uses as needed. Does have oxygen that she wears around the clock at 2L although she didn't wear it into the office today. She does follow closely with pulmonology (Dr.  Humphrey Rolls).  Patient brought in her medication list and verbally reviewed with her. Per above, she will check on metoprolol dosing.   Return here in 1 month or sooner for any questions/problems before then.

## 2016-04-10 ENCOUNTER — Ambulatory Visit
Admission: EM | Admit: 2016-04-10 | Discharge: 2016-04-10 | Disposition: A | Discharge status: Home / Self Care | Payer: BLUE CROSS/BLUE SHIELD | Attending: Family Medicine | Admitting: Family Medicine

## 2016-04-10 ENCOUNTER — Ambulatory Visit: Payer: BLUE CROSS/BLUE SHIELD

## 2016-04-10 DIAGNOSIS — E785 Hyperlipidemia, unspecified: Secondary | ICD-10-CM | POA: Insufficient documentation

## 2016-04-10 DIAGNOSIS — X58XXXA Exposure to other specified factors, initial encounter: Secondary | ICD-10-CM | POA: Diagnosis not present

## 2016-04-10 DIAGNOSIS — N189 Chronic kidney disease, unspecified: Secondary | ICD-10-CM | POA: Insufficient documentation

## 2016-04-10 DIAGNOSIS — I4891 Unspecified atrial fibrillation: Secondary | ICD-10-CM | POA: Diagnosis not present

## 2016-04-10 DIAGNOSIS — I509 Heart failure, unspecified: Secondary | ICD-10-CM | POA: Diagnosis not present

## 2016-04-10 DIAGNOSIS — Z7901 Long term (current) use of anticoagulants: Secondary | ICD-10-CM | POA: Insufficient documentation

## 2016-04-10 DIAGNOSIS — J45909 Unspecified asthma, uncomplicated: Secondary | ICD-10-CM | POA: Insufficient documentation

## 2016-04-10 DIAGNOSIS — J449 Chronic obstructive pulmonary disease, unspecified: Secondary | ICD-10-CM | POA: Insufficient documentation

## 2016-04-10 DIAGNOSIS — Z79899 Other long term (current) drug therapy: Secondary | ICD-10-CM | POA: Diagnosis not present

## 2016-04-10 DIAGNOSIS — S61213A Laceration without foreign body of left middle finger without damage to nail, initial encounter: Secondary | ICD-10-CM | POA: Insufficient documentation

## 2016-04-10 DIAGNOSIS — I130 Hypertensive heart and chronic kidney disease with heart failure and stage 1 through stage 4 chronic kidney disease, or unspecified chronic kidney disease: Secondary | ICD-10-CM | POA: Insufficient documentation

## 2016-04-10 DIAGNOSIS — Z87891 Personal history of nicotine dependence: Secondary | ICD-10-CM | POA: Insufficient documentation

## 2016-04-10 MED ORDER — TETANUS-DIPHTH-ACELL PERTUSSIS 5-2.5-18.5 LF-MCG/0.5 IM SUSP
0.5000 mL | Freq: Once | INTRAMUSCULAR | Status: AC
  Administered 2016-04-10: 0.5 mL via INTRAMUSCULAR

## 2016-04-10 NOTE — ED Provider Notes (Signed)
CSN: [id number]     Arrival date & time 04/10/16  1956 History   First MD Initiated Contact with Patient 04/10/16 2117     Chief Complaint  Patient presents with  . Extremity Laceration    Laceration from door shutting on finger. Left hand middle finger. Bleeding controlled. Pain 3/10   (Consider location/radiation/quality/duration/timing/severity/associated sxs/prior Treatment) HPI  70 year old female presents after sustaining a laceration after the door shut on her left middle finger. This happened earlier today. The bleeding is now controlled. She has a laceration at the base of the nail. She is not current on her tetanus toxoid. Her oxygen saturation was 82% on room air because she had forgotten her constant O2. When placed on 2 L of oxygen in the clinic her O2 sats raised to 97%   Past Medical History  Diagnosis Date  . COPD (chronic obstructive pulmonary disease) ([place])   . Hypertension   . Asthma   . CHF (congestive heart failure) ([place])   . A-fib ([place])   . CKD (chronic kidney disease)   . HLD (hyperlipidemia)    Past Surgical History  Procedure Laterality Date  . Surgery on the neck    . Flexible bronchoscopy N/A 02/01/2016    Procedure: FLEXIBLE BRONCHOSCOPY;  Surgeon: Allyne Gee, MD;  Location: [facility] ORS;  Service: Pulmonary;  Laterality: N/A;   Family History  Problem Relation Age of Onset  . CAD Mother   . Lung cancer Father   . Arrhythmia Brother    Social History  Substance Use Topics  . Smoking status: Former Smoker -- 2.00 packs/day for 50 years    Quit date: 11/27/2015  . Smokeless tobacco: Never Used  . Alcohol Use: No   OB History    No data available     Review of Systems  Constitutional: Positive for activity change. Negative for fever, chills and fatigue.  Musculoskeletal: Positive for arthralgias.  Skin: Positive for wound.  All other systems reviewed and are negative.   Allergies  Review of patient's allergies indicates no known  allergies.  Home Medications   Prior to Admission medications   Medication Sig Start Date End Date Taking? Authorizing Provider  acetaminophen (TYLENOL) 325 MG tablet Take 325-650 mg by mouth at bedtime.   Yes Historical Provider, MD  albuterol (PROVENTIL HFA;VENTOLIN HFA) 108 (90 Base) MCG/ACT inhaler Inhale 2 puffs into the lungs every 4 (four) hours as needed for wheezing or shortness of breath.   Yes Historical Provider, MD  atorvastatin (LIPITOR) 20 MG tablet Take 20 mg by mouth daily.   Yes Historical Provider, MD  cholecalciferol (VITAMIN D) 1000 UNITS tablet Take 1,000 Units by mouth daily.   Yes Historical Provider, MD  dabigatran (PRADAXA) 150 MG CAPS capsule Take 150 mg by mouth 2 (two) times daily.   Yes Historical Provider, MD  digoxin (LANOXIN) 0.25 MG tablet Take 1 tablet (0.25 mg total) by mouth daily. 03/26/16  Yes Gladstone Lighter, MD  diltiazem (DILACOR XR) 240 MG 24 hr capsule Take 240 mg by mouth daily.   Yes Historical Provider, MD  Fluticasone-Salmeterol (ADVAIR) 250-50 MCG/DOSE AEPB Inhale 1 puff into the lungs 2 (two) times daily.   Yes Historical Provider, MD  furosemide (LASIX) 40 MG tablet Take 1 tablet (40 mg total) by mouth daily. 03/26/16  Yes Gladstone Lighter, MD  ipratropium-albuterol (DUONEB) 0.5-2.5 (3) MG/3ML SOLN Take 3 mLs by nebulization every 6 (six) hours. 11/30/15  Yes Fritzi Mandes, MD  lisinopril (PRINIVIL,ZESTRIL) 5 MG tablet  Take 1 tablet (5 mg total) by mouth daily. 03/26/16  Yes Gladstone Lighter, MD  metoprolol (LOPRESSOR) 100 MG tablet Take 1 tablet (100 mg total) by mouth 2 (two) times daily. Patient taking differently: Take 100 mg by mouth daily.  03/26/16  Yes Gladstone Lighter, MD  montelukast (SINGULAIR) 10 MG tablet Take 10 mg by mouth at bedtime.    Yes Historical Provider, MD  Multiple Vitamin (MULTIVITAMIN) tablet Take 1 tablet by mouth daily.   Yes Historical Provider, MD  tiotropium (SPIRIVA) 18 MCG inhalation capsule Place 18 mcg into  inhaler and inhale daily.   Yes Historical Provider, MD   Meds Ordered and Administered this Visit   Medications  Tdap (BOOSTRIX) injection 0.5 mL (0.5 mLs Intramuscular Given 04/10/16 2131)    BP 155/65 mmHg  Pulse 63  Temp(Src) 98 F (36.7 C) (Oral)  Resp 24  Ht 5\' 3"  (1.6 m)  Wt 157 lb (71.215 kg)  BMI 27.82 kg/m2  SpO2 82% No data found.   Physical Exam  Constitutional: She is oriented to person, place, and time. She appears well-developed and well-nourished.  HENT:  Head: Normocephalic and atraumatic.  Eyes: Conjunctivae are normal. Pupils are equal, round, and reactive to light.  Neck: Normal range of motion. Neck supple.  Musculoskeletal: Normal range of motion. She exhibits tenderness. She exhibits no edema.  Examination of the left nondominant finger shows a small laceration at the base of the nail not involving the matrix. Some smaller puncture areas. The laceration measures approximately 1 cm and is not requiring sutures.  Neurological: She is alert and oriented to person, place, and time.  Skin: Skin is warm and dry.  Psychiatric: She has a normal mood and affect. Her behavior is normal. Judgment and thought content normal.  Nursing note and vitals reviewed.   ED Course  Procedures (including critical care time)  Labs Review Labs Reviewed - No data to display  Imaging Review Dg Finger Middle Left  04/10/2016  CLINICAL DATA:  Smashed third digit in door today with pain, initial encounter EXAM: LEFT MIDDLE FINGER 2+V COMPARISON:  None. FINDINGS: No acute fracture or dislocation is seen. No significant soft tissue swelling is noted. IMPRESSION: No acute abnormality noted. Electronically Signed   By: Inez Catalina M.D.   On: 04/10/2016 21:44     Visual Acuity Review  Right Eye Distance:   Left Eye Distance:   Bilateral Distance:    Right Eye Near:   Left Eye Near:    Bilateral Near:    Medications  Tdap (BOOSTRIX) injection 0.5 mL (0.5 mLs Intramuscular  Given 04/10/16 2131)   Procedure: Wound was thoroughly cleansed. Dermabond was utilized to cover the distal tip including the laceration and small puncture was wounds to the fingertip. After drying of the Dermabond the patient was placed in a dry sterile dressing of gauze which she will remove tomorrow morning.     MDM   1. Laceration of left middle finger w/o foreign body w/o damage to nail, initial encounter    Plan: 1. Test/x-ray results and diagnosis reviewed with patient 2. rx as per orders; risks, benefits, potential side effects reviewed with patient 3. Recommend supportive treatment with elevation to control pain and swelling. We dressed the wound tonight with gauze and she may remove this tomorrow and begin a washing program. Not to pick off the Dermabond instead let it fall off on its own accord. We have discussed the signs and symptoms of infection and she will  return to clinic as soon as possible if any of these develop. Place on any medications prophylactically. She will use some Motrin or Tylenol to control her pain. 4. F/u prn if symptoms worsen or don't improve     Lorin Picket, PA-C 04/10/16 2232

## 2016-04-10 NOTE — Discharge Instructions (Signed)
Laceration Care, Adult A laceration is a cut that goes through all of the layers of the skin and into the tissue that is right under the skin. Some lacerations heal on their own. Others need to be closed with stitches (sutures), staples, skin adhesive strips, or skin glue. Proper laceration care minimizes the risk of infection and helps the laceration to heal better. HOW TO CARE FOR YOUR LACERATION If sutures or staples were used:  Keep the wound clean and dry.  If you were given a bandage (dressing), you should change it at least one time per day or as told by your health care provider. You should also change it if it becomes wet or dirty.  Keep the wound completely dry for the first 24 hours or as told by your health care provider. After that time, you may shower or bathe. However, make sure that the wound is not soaked in water until after the sutures or staples have been removed.  Clean the wound one time each day or as told by your health care provider:  Wash the wound with soap and water.  Rinse the wound with water to remove all soap.  Pat the wound dry with a clean towel. Do not rub the wound.  After cleaning the wound, apply a thin layer of antibiotic ointmentas told by your health care provider. This will help to prevent infection and keep the dressing from sticking to the wound.  Have the sutures or staples removed as told by your health care provider. If skin adhesive strips were used:  Keep the wound clean and dry.  If you were given a bandage (dressing), you should change it at least one time per day or as told by your health care provider. You should also change it if it becomes dirty or wet.  Do not get the skin adhesive strips wet. You may shower or bathe, but be careful to keep the wound dry.  If the wound gets wet, pat it dry with a clean towel. Do not rub the wound.  Skin adhesive strips fall off on their own. You may trim the strips as the wound heals. Do not  remove skin adhesive strips that are still stuck to the wound. They will fall off in time. If skin glue was used:  Try to keep the wound dry, but you may briefly wet it in the shower or bath. Do not soak the wound in water, such as by swimming.  After you have showered or bathed, gently pat the wound dry with a clean towel. Do not rub the wound.  Do not do any activities that will make you sweat heavily until the skin glue has fallen off on its own.  Do not apply liquid, cream, or ointment medicine to the wound while the skin glue is in place. Using those may loosen the film before the wound has healed.  If you were given a bandage (dressing), you should change it at least one time per day or as told by your health care provider. You should also change it if it becomes dirty or wet.  If a dressing is placed over the wound, be careful not to apply tape directly over the skin glue. Doing that may cause the glue to be pulled off before the wound has healed.  Do not pick at the glue. The skin glue usually remains in place for 5-10 days, then it falls off of the skin. General Instructions  Take over-the-counter and prescription  medicines only as told by your health care provider. °· If you were prescribed an antibiotic medicine or ointment, take or apply it as told by your doctor. Do not stop using it even if your condition improves. °· To help prevent scarring, make sure to cover your wound with sunscreen whenever you are outside after stitches are removed, after adhesive strips are removed, or when glue remains in place and the wound is healed. Make sure to wear a sunscreen of at least 30 SPF. °· Do not scratch or pick at the wound. °· Keep all follow-up visits as told by your health care provider. This is important. °· Check your wound every day for signs of infection. Watch for: °· Redness, swelling, or pain. °· Fluid, blood, or pus. °· Raise (elevate) the injured area above the level of your heart  while you are sitting or lying down, if possible. °SEEK MEDICAL CARE IF: °· You received a tetanus shot and you have swelling, severe pain, redness, or bleeding at the injection site. °· You have a fever. °· A wound that was closed breaks open. °· You notice a bad smell coming from your wound or your dressing. °· You notice something coming out of the wound, such as wood or glass. °· Your pain is not controlled with medicine. °· You have increased redness, swelling, or pain at the site of your wound. °· You have fluid, blood, or pus coming from your wound. °· You notice a change in the color of your skin near your wound. °· You need to change the dressing frequently due to fluid, blood, or pus draining from the wound. °· You develop a new rash. °· You develop numbness around the wound. °SEEK IMMEDIATE MEDICAL CARE IF: °· You develop severe swelling around the wound. °· Your pain suddenly increases and is severe. °· You develop painful lumps near the wound or on skin that is anywhere on your body. °· You have a red streak going away from your wound. °· The wound is on your hand or foot and you cannot properly move a finger or toe. °· The wound is on your hand or foot and you notice that your fingers or toes look pale or bluish. °  °This information is not intended to replace advice given to you by your health care provider. Make sure you discuss any questions you have with your health care provider. °  °Document Released: 12/03/2005 Document Revised: 04/19/2015 Document Reviewed: 11/29/2014 °Elsevier Interactive Patient Education ©2016 [employer] ° °Stitches, Staples, or Adhesive Wound Closure °Health care providers use stitches (sutures), staples, and certain glue (skin adhesives) to hold skin together while it heals (wound closure). You may need this treatment after you have surgery or if you cut your skin accidentally. These methods help your skin to heal more quickly and make it less likely that you will have  a scar. A wound may take several months to heal completely. °The type of wound you have determines when your wound gets closed. In most cases, the wound is closed as soon as possible (primary skin closure). Sometimes, closure is delayed so the wound can be cleaned and allowed to heal naturally. This reduces the chance of infection. Delayed closure may be needed if your wound: °· Is caused by a bite. °· Happened more than 6 hours ago. °· Involves loss of skin or the tissues under the skin. °· Has dirt or debris in it that cannot be removed. °· Is infected. °WHAT   ARE THE DIFFERENT KINDS OF WOUND CLOSURES? °There are many options for wound closure. The one that your health care provider uses depends on how deep and how large your wound is. °Adhesive Glue °To use this type of glue to close a wound, your health care provider holds the edges of the wound together and paints the glue on the surface of your skin. You may need more than one layer of glue. Then the wound may be covered with a light bandage (dressing). °This type of skin closure may be used for small wounds that are not deep (superficial). Using glue for wound closure is less painful than other methods. It does not require a medicine that numbs the area (local anesthetic). This method also leaves nothing to be removed. Adhesive glue is often used for children and on facial wounds. °Adhesive glue cannot be used for wounds that are deep, uneven, or bleeding. It is not used inside of a wound.  °Adhesive Strips °These strips are made of sticky (adhesive), porous paper. They are applied across your skin edges like a regular adhesive bandage. You leave them on until they fall off. °Adhesive strips may be used to close very superficial wounds. They may also be used along with sutures to improve the closure of your skin edges.  °Sutures °Sutures are the oldest method of wound closure. Sutures can be made from natural substances, such as silk, or from synthetic  materials, such as nylon and steel. They can be made from a material that your body can break down as your wound heals (absorbable), or they can be made from a material that needs to be removed from your skin (nonabsorbable). They come in many different strengths and sizes. °Your health care provider attaches the sutures to a steel needle on one end. Sutures can be passed through your skin, or through the tissues beneath your skin. Then they are tied and cut. Your skin edges may be closed in one continuous stitch or in separate stitches. °Sutures are strong and can be used for all kinds of wounds. Absorbable sutures may be used to close tissues under the skin. The disadvantage of sutures is that they may cause skin reactions that lead to infection. Nonabsorbable sutures need to be removed. °Staples °When surgical staples are used to close a wound, the edges of your skin on both sides of the wound are brought close together. A staple is placed across the wound, and an instrument secures the edges together. Staples are often used to close surgical cuts (incisions). °Staples are faster to use than sutures, and they cause less skin reaction. Staples need to be removed using a tool that bends the staples away from your skin. °HOW DO I CARE FOR MY WOUND CLOSURE? °· Take medicines only as directed by your health care provider. °· If you were prescribed an antibiotic medicine for your wound, finish it all even if you start to feel better. °· Use ointments or creams only as directed by your health care provider. °· Wash your hands with soap and water before and after touching your wound. °· Do not soak your wound in water. Do not take baths, swim, or use a hot tub until your health care provider approves. °· Ask your health care provider when you can start showering. Cover your wound if directed by your health care provider. °· Do not take out your own sutures or staples. °· Do not pick at your wound. Picking can cause an  infection. °·   Keep all follow-up visits as directed by your health care provider. This is important. HOW LONG WILL I HAVE MY WOUND CLOSURE?  Leave adhesive glue on your skin until the glue peels away.  Leave adhesive strips on your skin until the strips fall off.  Absorbable sutures will dissolve within several days.  Nonabsorbable sutures and staples must be removed. The location of the wound will determine how long they stay in. This can range from several days to a couple of weeks. WHEN SHOULD I SEEK HELP FOR MY WOUND CLOSURE? Contact your health care provider if:  You have a fever.  You have chills.  You have drainage, redness, swelling, or pain at your wound.  There is a bad smell coming from your wound.  The skin edges of your wound start to separate after your sutures have been removed.  Your wound becomes thick, raised, and darker in color after your sutures come out (scarring).   This information is not intended to replace advice given to you by your health care provider. Make sure you discuss any questions you have with your health care provider.   Document Released: 08/28/2001 Document Revised: 12/24/2014 Document Reviewed: 05/12/2014 Elsevier Interactive Patient Education 2016 [place] Taking care of your wound properly can help to prevent pain and infection. It can also help your wound to heal more quickly.  HOW TO CARE FOR YOUR WOUND  Take or apply over-the-counter and prescription medicines only as told by your health care provider.  If you were prescribed antibiotic medicine, take or apply it as told by your health care provider. Do not stop using the antibiotic even if your condition improves.  Clean the wound each day or as told by your health care provider.  Wash the wound with mild soap and water.  Rinse the wound with water to remove all soap.  Pat the wound dry with a clean towel. Do not rub it.  There are many different ways to  close and cover a wound. For example, a wound can be covered with stitches (sutures), skin glue, or adhesive strips. Follow instructions from your health care provider about:  How to take care of your wound.  When and how you should change your bandage (dressing).  When you should remove your dressing.  Removing whatever was used to close your wound.  Check your wound every day for signs of infection. Watch for:  Redness, swelling, or pain.  Fluid, blood, or pus.  Keep the dressing dry until your health care provider says it can be removed. Do not take baths, swim, use a hot tub, or do anything that would put your wound underwater until your health care provider approves.  Raise (elevate) the injured area above the level of your heart while you are sitting or lying down.  Do not scratch or pick at the wound.  Keep all follow-up visits as told by your health care provider. This is important. SEEK MEDICAL CARE IF:  You received a tetanus shot and you have swelling, severe pain, redness, or bleeding at the injection site.  You have a fever.  Your pain is not controlled with medicine.  You have increased redness, swelling, or pain at the site of your wound.  You have fluid, blood, or pus coming from your wound.  You notice a bad smell coming from your wound or your dressing. SEEK IMMEDIATE MEDICAL CARE IF:  You have a red streak going away from your wound.  This information is not intended to replace advice given to you by your health care provider. Make sure you discuss any questions you have with your health care provider.   Document Released: 09/11/2008 Document Revised: 04/19/2015 Document Reviewed: 11/29/2014 Elsevier Interactive Patient Education [employer].

## 2016-05-01 ENCOUNTER — Ambulatory Visit
Admission: RE | Admit: 2016-05-01 | Discharge: 2016-05-01 | Disposition: A | Discharge status: Home / Self Care | Payer: BLUE CROSS/BLUE SHIELD | Source: Ambulatory Visit | Attending: Internal Medicine | Admitting: Internal Medicine

## 2016-05-01 ENCOUNTER — Other Ambulatory Visit: Payer: Self-pay | Admitting: Internal Medicine

## 2016-05-01 DIAGNOSIS — R0602 Shortness of breath: Secondary | ICD-10-CM

## 2016-05-01 DIAGNOSIS — J90 Pleural effusion, not elsewhere classified: Secondary | ICD-10-CM | POA: Insufficient documentation

## 2016-05-01 DIAGNOSIS — R918 Other nonspecific abnormal finding of lung field: Secondary | ICD-10-CM | POA: Insufficient documentation

## 2016-05-04 ENCOUNTER — Encounter: Payer: Self-pay | Admitting: Family

## 2016-05-04 ENCOUNTER — Ambulatory Visit: Payer: BLUE CROSS/BLUE SHIELD | Attending: Family | Admitting: Family

## 2016-05-04 VITALS — BP 148/78 | HR 78 | Resp 18 | Ht 63.0 in | Wt 162.0 lb

## 2016-05-04 DIAGNOSIS — J449 Chronic obstructive pulmonary disease, unspecified: Secondary | ICD-10-CM

## 2016-05-04 DIAGNOSIS — N189 Chronic kidney disease, unspecified: Secondary | ICD-10-CM | POA: Insufficient documentation

## 2016-05-04 DIAGNOSIS — J45909 Unspecified asthma, uncomplicated: Secondary | ICD-10-CM | POA: Diagnosis not present

## 2016-05-04 DIAGNOSIS — Z87891 Personal history of nicotine dependence: Secondary | ICD-10-CM | POA: Diagnosis not present

## 2016-05-04 DIAGNOSIS — I5022 Chronic systolic (congestive) heart failure: Secondary | ICD-10-CM | POA: Insufficient documentation

## 2016-05-04 DIAGNOSIS — I480 Paroxysmal atrial fibrillation: Secondary | ICD-10-CM

## 2016-05-04 DIAGNOSIS — I10 Essential (primary) hypertension: Secondary | ICD-10-CM

## 2016-05-04 DIAGNOSIS — R5383 Other fatigue: Secondary | ICD-10-CM | POA: Diagnosis not present

## 2016-05-04 DIAGNOSIS — Z79899 Other long term (current) drug therapy: Secondary | ICD-10-CM | POA: Insufficient documentation

## 2016-05-04 DIAGNOSIS — Z8249 Family history of ischemic heart disease and other diseases of the circulatory system: Secondary | ICD-10-CM | POA: Diagnosis not present

## 2016-05-04 DIAGNOSIS — Z9889 Other specified postprocedural states: Secondary | ICD-10-CM | POA: Insufficient documentation

## 2016-05-04 DIAGNOSIS — I4891 Unspecified atrial fibrillation: Secondary | ICD-10-CM | POA: Insufficient documentation

## 2016-05-04 DIAGNOSIS — I129 Hypertensive chronic kidney disease with stage 1 through stage 4 chronic kidney disease, or unspecified chronic kidney disease: Secondary | ICD-10-CM | POA: Diagnosis not present

## 2016-05-04 DIAGNOSIS — E785 Hyperlipidemia, unspecified: Secondary | ICD-10-CM | POA: Insufficient documentation

## 2016-05-04 DIAGNOSIS — Z7901 Long term (current) use of anticoagulants: Secondary | ICD-10-CM | POA: Diagnosis not present

## 2016-05-04 NOTE — Patient Instructions (Signed)
Continue weighing daily and call for an overnight weight gain of > 2 pounds or a weekly weight gain of >5 pounds. 

## 2016-05-04 NOTE — Progress Notes (Signed)
Subjective:    Patient ID: Elaine Decker, female    DOB: 11-22-1946, 70 y.o.   MRN: [id number]  Congestive Heart Failure Presents for follow-up visit. The disease course has been stable. Associated symptoms include edema, fatigue and shortness of breath. Pertinent negatives include no abdominal pain, chest pain, orthopnea or palpitations. The symptoms have been stable. Past treatments include ACE inhibitors, beta blockers, salt and fluid restriction and digoxin. The treatment provided moderate relief. Compliance with prior treatments has been good. Her past medical history is significant for arrhythmia, chronic lung disease and HTN. There is no history of CVA or DM. She has one 1st degree relative with heart disease.  Hypertension This is a chronic problem. The current episode started more than 1 year ago. The problem is unchanged. The problem is controlled. Associated symptoms include peripheral edema and shortness of breath. Pertinent negatives include no chest pain, headaches, neck pain or palpitations. Agents associated with hypertension include steroids. Risk factors for coronary artery disease include dyslipidemia, family history, post-menopausal state and smoking/tobacco exposure. Past treatments include ACE inhibitors, beta blockers, calcium channel blockers, diuretics and lifestyle changes. The current treatment provides significant improvement. There are no compliance problems.  Hypertensive end-organ damage includes kidney disease and heart failure. There is no history of CVA.    Past Medical History  Diagnosis Date  . COPD (chronic obstructive pulmonary disease) ([place])   . Hypertension   . Asthma   . CHF (congestive heart failure) ([place])   . A-fib ([place])   . CKD (chronic kidney disease)   . HLD (hyperlipidemia)     Past Surgical History  Procedure Laterality Date  . Surgery on the neck    . Flexible bronchoscopy N/A 02/01/2016    Procedure: FLEXIBLE BRONCHOSCOPY;  Surgeon: Allyne Gee, MD;  Location: [facility] ORS;  Service: Pulmonary;  Laterality: N/A;    Family History  Problem Relation Age of Onset  . CAD Mother   . Lung cancer Father   . Arrhythmia Brother     Social History  Substance Use Topics  . Smoking status: Former Smoker -- 2.00 packs/day for 50 years    Quit date: 11/27/2015  . Smokeless tobacco: Never Used  . Alcohol Use: No    No Known Allergies  Prior to Admission medications   Medication Sig Start Date End Date Taking? Authorizing Provider  acetaminophen (TYLENOL) 325 MG tablet Take 325-650 mg by mouth at bedtime.   Yes Historical Provider, MD  albuterol (PROVENTIL HFA;VENTOLIN HFA) 108 (90 Base) MCG/ACT inhaler Inhale 2 puffs into the lungs every 4 (four) hours as needed for wheezing or shortness of breath.   Yes Historical Provider, MD  atorvastatin (LIPITOR) 20 MG tablet Take 20 mg by mouth daily.   Yes Historical Provider, MD  cholecalciferol (VITAMIN D) 1000 UNITS tablet Take 1,000 Units by mouth daily.   Yes Historical Provider, MD  dabigatran (PRADAXA) 150 MG CAPS capsule Take 150 mg by mouth 2 (two) times daily.   Yes Historical Provider, MD  digoxin (LANOXIN) 0.25 MG tablet Take 1 tablet (0.25 mg total) by mouth daily. 03/26/16  Yes Gladstone Lighter, MD  diltiazem (DILACOR XR) 240 MG 24 hr capsule Take 240 mg by mouth daily.   Yes Historical Provider, MD  Fluticasone-Salmeterol (ADVAIR) 250-50 MCG/DOSE AEPB Inhale 1 puff into the lungs 2 (two) times daily.   Yes Historical Provider, MD  furosemide (LASIX) 40 MG tablet Take 1 tablet (40 mg total) by mouth daily. 03/26/16  Yes Gladstone Lighter, MD  ipratropium-albuterol (DUONEB) 0.5-2.5 (3) MG/3ML SOLN Take 3 mLs by nebulization every 6 (six) hours. 11/30/15  Yes Fritzi Mandes, MD  levofloxacin (LEVAQUIN) 500 MG tablet Take 500 mg by mouth daily.   Yes Historical Provider, MD  lisinopril (PRINIVIL,ZESTRIL) 5 MG tablet Take 1 tablet (5 mg total) by mouth daily. 03/26/16  Yes Gladstone Lighter,  MD  metoprolol (LOPRESSOR) 100 MG tablet Take 1 tablet (100 mg total) by mouth 2 (two) times daily. 03/26/16  Yes Gladstone Lighter, MD  montelukast (SINGULAIR) 10 MG tablet Take 10 mg by mouth at bedtime.    Yes Historical Provider, MD  Multiple Vitamin (MULTIVITAMIN) tablet Take 1 tablet by mouth daily.   Yes Historical Provider, MD  PrednisoLONE 5 MG (21) TBPK Take by mouth as directed.   Yes Historical Provider, MD  tiotropium (SPIRIVA) 18 MCG inhalation capsule Place 18 mcg into inhaler and inhale daily.   Yes Historical Provider, MD     Review of Systems  Constitutional: Positive for fatigue. Negative for appetite change.  HENT: Negative for congestion, rhinorrhea and sore throat.   Eyes: Negative.   Respiratory: Positive for shortness of breath. Negative for cough, chest tightness and wheezing.   Cardiovascular: Positive for leg swelling. Negative for chest pain and palpitations.  Gastrointestinal: Negative for abdominal pain and abdominal distention.  Endocrine: Negative.   Genitourinary: Negative.   Musculoskeletal: Negative for back pain and neck pain.  Skin: Negative.   Allergic/Immunologic: Negative.   Neurological: Positive for light-headedness (when laying on the left side for a few seconds). Negative for dizziness and headaches.  Hematological: Negative for adenopathy. Bruises/bleeds easily.  Psychiatric/Behavioral: Negative for sleep disturbance (sleeping on 1 pillow with CPAP and oxygen at 2L) and dysphoric mood. The patient is not nervous/anxious.        Objective:   Physical Exam  Constitutional: She is oriented to person, place, and time. She appears well-developed and well-nourished.  HENT:  Head: Normocephalic and atraumatic.  Eyes: Conjunctivae are normal. Pupils are equal, round, and reactive to light.  Neck: Normal range of motion. Neck supple.  Cardiovascular: Normal rate.  An irregular rhythm present.  Pulmonary/Chest: Effort normal. She has no wheezes.  She has no rales.  Abdominal: Soft. She exhibits no distension. There is no tenderness.  Musculoskeletal: She exhibits edema (1+ pitting edema in bilateral lower legs). She exhibits no tenderness.  Neurological: She is alert and oriented to person, place, and time.  Skin: Skin is warm and dry.  Psychiatric: She has a normal mood and affect. Her behavior is normal. Thought content normal.  Nursing note and vitals reviewed.   BP 148/78 mmHg  Pulse 78  Resp 18  Ht 5\' 3"  (1.6 m)  Wt 162 lb (73.483 kg)  BMI 28.70 kg/m2       Assessment & Plan:  1: Chronic heart failure with reduced ejection fraction- Patient presents with fatigue and shortness of breath upon exertion. She says that she was a little winded upon walking into the building but once she sat down for a few minutes, her breathing improved. She feels like she is sleeping well. She continues to have some intermittent swelling in her lower legs and has currently resumed wearing her compression socks. She also does elevate them some at home. She continues to weigh herself and reports a stable weight. By our scale, she's gained 4 pounds since she was last here on 04/04/16. Reminded to call for an overnight weight gain of >2  pounds or a weekly weight gain of >5 pounds. She is not adding salt on her food and tries make low sodium choices. Not interested in changing to entresto at this time but says she'll think about it. She is going on a airplane trip early July and would like to not make any long-term changes at this time. 2: Atrial fibrillation- Currently rate controlled on digoxin, diltiazem and metoprolol. Follows with cardiology. 3: HTN- Blood pressure looks good today. Continue medications. 4: COPD- Wears her oxygen at 2L around the clock. Had an episode a couple of days ago where she was more short of breath than usual, saw her pulmonologist and was placed on prednisone and levaquin. Feels like her breathing is back to baseline.    Return here in 3 months or sooner for any questions/problems before then.

## 2016-05-25 ENCOUNTER — Other Ambulatory Visit: Payer: Self-pay

## 2016-05-25 MED ORDER — LISINOPRIL 5 MG PO TABS: 5.0000 mg | ORAL_TABLET | Freq: Every day | ORAL | Status: DC

## 2016-05-31 ENCOUNTER — Other Ambulatory Visit: Payer: Self-pay | Admitting: Physician Assistant

## 2016-05-31 DIAGNOSIS — J151 Pneumonia due to Pseudomonas: Secondary | ICD-10-CM

## 2016-06-05 ENCOUNTER — Ambulatory Visit
Admission: RE | Admit: 2016-06-05 | Discharge: 2016-06-05 | Disposition: A | Discharge status: Home / Self Care | Payer: BLUE CROSS/BLUE SHIELD | Source: Ambulatory Visit | Attending: Internal Medicine | Admitting: Internal Medicine

## 2016-06-05 ENCOUNTER — Ambulatory Visit
Admission: RE | Admit: 2016-06-05 | Discharge: 2016-06-05 | Disposition: A | Discharge status: Home / Self Care | Payer: BLUE CROSS/BLUE SHIELD | Source: Ambulatory Visit | Attending: Physician Assistant | Admitting: Physician Assistant

## 2016-06-05 ENCOUNTER — Ambulatory Visit (HOSPITAL_COMMUNITY)
Admission: RE | Admit: 2016-06-05 | Discharge: 2016-06-05 | Disposition: A | Discharge status: Home / Self Care | Payer: BLUE CROSS/BLUE SHIELD | Source: Ambulatory Visit | Attending: Internal Medicine | Admitting: Internal Medicine

## 2016-06-05 ENCOUNTER — Other Ambulatory Visit: Payer: Self-pay | Admitting: Internal Medicine

## 2016-06-05 ENCOUNTER — Other Ambulatory Visit
Admission: RE | Admit: 2016-06-05 | Discharge: 2016-06-05 | Disposition: A | Discharge status: Home / Self Care | Payer: BLUE CROSS/BLUE SHIELD | Source: Ambulatory Visit | Attending: Physician Assistant | Admitting: Physician Assistant

## 2016-06-05 DIAGNOSIS — J90 Pleural effusion, not elsewhere classified: Secondary | ICD-10-CM

## 2016-06-05 DIAGNOSIS — J151 Pneumonia due to Pseudomonas: Secondary | ICD-10-CM

## 2016-06-05 DIAGNOSIS — I517 Cardiomegaly: Secondary | ICD-10-CM | POA: Diagnosis not present

## 2016-06-05 DIAGNOSIS — J439 Emphysema, unspecified: Secondary | ICD-10-CM | POA: Insufficient documentation

## 2016-06-05 DIAGNOSIS — I272 Other secondary pulmonary hypertension: Secondary | ICD-10-CM | POA: Diagnosis not present

## 2016-06-05 LAB — CREATININE, SERUM
CREATININE: 0.64 mg/dL (ref 0.44–1.00)
GFR calc Af Amer: >60 mL/min (ref >60)
GFR calc non Af Amer: >60 mL/min (ref >60)

## 2016-06-05 MED ORDER — IOPAMIDOL (ISOVUE-300) INJECTION 61%
75.0000 mL | Freq: Once | INTRAVENOUS | Status: AC | PRN
  Administered 2016-06-05: 75 mL via INTRAVENOUS

## 2016-06-26 ENCOUNTER — Other Ambulatory Visit: Payer: Self-pay

## 2016-06-26 MED ORDER — DIGOXIN 250 MCG PO TABS: 0.2500 mg | ORAL_TABLET | Freq: Every day | ORAL | Status: DC

## 2016-07-02 ENCOUNTER — Encounter: Payer: Self-pay | Admitting: *Deleted

## 2016-07-02 ENCOUNTER — Inpatient Hospital Stay: Payer: Medicare Other

## 2016-07-02 ENCOUNTER — Inpatient Hospital Stay
Admission: AD | Admit: 2016-07-02 | Discharge: 2016-07-06 | DRG: 190 | Disposition: A | Discharge status: Home / Self Care | Payer: Medicare Other | Source: Ambulatory Visit | Attending: Specialist | Admitting: Specialist

## 2016-07-02 DIAGNOSIS — J9621 Acute and chronic respiratory failure with hypoxia: Secondary | ICD-10-CM | POA: Diagnosis present

## 2016-07-02 DIAGNOSIS — E785 Hyperlipidemia, unspecified: Secondary | ICD-10-CM | POA: Diagnosis present

## 2016-07-02 DIAGNOSIS — Z7951 Long term (current) use of inhaled steroids: Secondary | ICD-10-CM

## 2016-07-02 DIAGNOSIS — I5023 Acute on chronic systolic (congestive) heart failure: Secondary | ICD-10-CM | POA: Diagnosis present

## 2016-07-02 DIAGNOSIS — K761 Chronic passive congestion of liver: Secondary | ICD-10-CM | POA: Diagnosis not present

## 2016-07-02 DIAGNOSIS — W19XXXA Unspecified fall, initial encounter: Secondary | ICD-10-CM | POA: Diagnosis present

## 2016-07-02 DIAGNOSIS — Z6827 Body mass index (BMI) 27.0-27.9, adult: Secondary | ICD-10-CM | POA: Diagnosis not present

## 2016-07-02 DIAGNOSIS — J189 Pneumonia, unspecified organism: Secondary | ICD-10-CM | POA: Diagnosis not present

## 2016-07-02 DIAGNOSIS — M7989 Other specified soft tissue disorders: Secondary | ICD-10-CM | POA: Diagnosis present

## 2016-07-02 DIAGNOSIS — J440 Chronic obstructive pulmonary disease with acute lower respiratory infection: Secondary | ICD-10-CM | POA: Diagnosis present

## 2016-07-02 DIAGNOSIS — Z79899 Other long term (current) drug therapy: Secondary | ICD-10-CM | POA: Diagnosis not present

## 2016-07-02 DIAGNOSIS — I130 Hypertensive heart and chronic kidney disease with heart failure and stage 1 through stage 4 chronic kidney disease, or unspecified chronic kidney disease: Secondary | ICD-10-CM | POA: Diagnosis present

## 2016-07-02 DIAGNOSIS — Z23 Encounter for immunization: Secondary | ICD-10-CM | POA: Diagnosis not present

## 2016-07-02 DIAGNOSIS — Z9981 Dependence on supplemental oxygen: Secondary | ICD-10-CM | POA: Diagnosis not present

## 2016-07-02 DIAGNOSIS — N182 Chronic kidney disease, stage 2 (mild): Secondary | ICD-10-CM | POA: Diagnosis present

## 2016-07-02 DIAGNOSIS — Z7901 Long term (current) use of anticoagulants: Secondary | ICD-10-CM

## 2016-07-02 DIAGNOSIS — B179 Acute viral hepatitis, unspecified: Secondary | ICD-10-CM | POA: Diagnosis present

## 2016-07-02 DIAGNOSIS — I482 Chronic atrial fibrillation: Secondary | ICD-10-CM | POA: Diagnosis present

## 2016-07-02 DIAGNOSIS — Z87891 Personal history of nicotine dependence: Secondary | ICD-10-CM | POA: Diagnosis not present

## 2016-07-02 DIAGNOSIS — K759 Inflammatory liver disease, unspecified: Secondary | ICD-10-CM

## 2016-07-02 DIAGNOSIS — I071 Rheumatic tricuspid insufficiency: Secondary | ICD-10-CM | POA: Diagnosis present

## 2016-07-02 DIAGNOSIS — R748 Abnormal levels of other serum enzymes: Secondary | ICD-10-CM

## 2016-07-02 DIAGNOSIS — R609 Edema, unspecified: Secondary | ICD-10-CM

## 2016-07-02 DIAGNOSIS — M25472 Effusion, left ankle: Secondary | ICD-10-CM

## 2016-07-02 DIAGNOSIS — E1122 Type 2 diabetes mellitus with diabetic chronic kidney disease: Secondary | ICD-10-CM | POA: Diagnosis not present

## 2016-07-02 DIAGNOSIS — J449 Chronic obstructive pulmonary disease, unspecified: Secondary | ICD-10-CM

## 2016-07-02 DIAGNOSIS — E1165 Type 2 diabetes mellitus with hyperglycemia: Secondary | ICD-10-CM | POA: Diagnosis not present

## 2016-07-02 DIAGNOSIS — J441 Chronic obstructive pulmonary disease with (acute) exacerbation: Secondary | ICD-10-CM | POA: Diagnosis present

## 2016-07-02 DIAGNOSIS — M25471 Effusion, right ankle: Secondary | ICD-10-CM

## 2016-07-02 LAB — COMPREHENSIVE METABOLIC PANEL
ALK PHOS: 96 U/L (ref 38–126)
ALT: 2192 U/L — AB (ref 14–54)
ANION GAP: 6 (ref 5–15)
AST: 690 U/L — ABNORMAL HIGH (ref 15–41)
Albumin: 3.3 g/dL — ABNORMAL LOW (ref 3.5–5.0)
BUN: 55 mg/dL — ABNORMAL HIGH (ref 6–20)
CALCIUM: 8.4 mg/dL — AB (ref 8.9–10.3)
CO2: 32 mmol/L (ref 22–32)
CREATININE: 1.12 mg/dL — AB (ref 0.44–1.00)
Chloride: 101 mmol/L (ref 101–111)
GFR, EST AFRICAN AMERICAN: 58 mL/min — AB (ref >60)
GFR, EST NON AFRICAN AMERICAN: 50 mL/min — AB (ref >60)
Glucose, Bld: 146 mg/dL — ABNORMAL HIGH (ref 65–99)
Potassium: 4.1 mmol/L (ref 3.5–5.1)
SODIUM: 139 mmol/L (ref 135–145)
TOTAL PROTEIN: 6.2 g/dL — AB (ref 6.5–8.1)
Total Bilirubin: 1.4 mg/dL — ABNORMAL HIGH (ref 0.3–1.2)

## 2016-07-02 LAB — CBC
HCT: 33.9 % — ABNORMAL LOW (ref 35.0–47.0)
HEMOGLOBIN: 10.8 g/dL — AB (ref 12.0–16.0)
MCH: 29.4 pg (ref 26.0–34.0)
MCHC: 32 g/dL (ref 32.0–36.0)
MCV: 91.9 fL (ref 80.0–100.0)
Platelets: 189 10*3/uL (ref 150–440)
RBC: 3.69 MIL/uL — AB (ref 3.80–5.20)
RDW: 16.1 % — ABNORMAL HIGH (ref 11.5–14.5)
WBC: 16.2 10*3/uL — ABNORMAL HIGH (ref 3.6–11.0)

## 2016-07-02 LAB — BRAIN NATRIURETIC PEPTIDE: B Natriuretic Peptide: 1806 pg/mL — ABNORMAL HIGH (ref 0.0–100.0)

## 2016-07-02 LAB — TSH: TSH: 0.637 u[IU]/mL (ref 0.350–4.500)

## 2016-07-02 MED ORDER — ALBUTEROL SULFATE (2.5 MG/3ML) 0.083% IN NEBU: 2.5000 mg | INHALATION_SOLUTION | RESPIRATORY_TRACT | Status: DC | PRN

## 2016-07-02 MED ORDER — METHYLPREDNISOLONE SODIUM SUCC 125 MG IJ SOLR
60.0000 mg | Freq: Four times a day (QID) | INTRAMUSCULAR | Status: DC
  Administered 2016-07-02 – 2016-07-03 (×4): 60 mg via INTRAVENOUS
  Filled 2016-07-02 (×4): qty 2

## 2016-07-02 MED ORDER — SODIUM CHLORIDE 0.9% FLUSH
3.0000 mL | Freq: Two times a day (BID) | INTRAVENOUS | Status: DC
  Administered 2016-07-02 – 2016-07-04 (×5): 3 mL via INTRAVENOUS

## 2016-07-02 MED ORDER — ADULT MULTIVITAMIN W/MINERALS CH
1.0000 | ORAL_TABLET | Freq: Every day | ORAL | Status: DC
Start: 2016-07-02 — End: 2016-07-06
  Administered 2016-07-03 – 2016-07-06 (×2): 1 via ORAL
  Filled 2016-07-02 (×4): qty 1

## 2016-07-02 MED ORDER — POLYETHYLENE GLYCOL 3350 17 G PO PACK: 17.0000 g | PACK | Freq: Every day | ORAL | Status: DC | PRN

## 2016-07-02 MED ORDER — ACETAMINOPHEN 325 MG PO TABS: 650.0000 mg | ORAL_TABLET | Freq: Four times a day (QID) | ORAL | Status: DC | PRN

## 2016-07-02 MED ORDER — SODIUM CHLORIDE 0.9% FLUSH
3.0000 mL | Freq: Two times a day (BID) | INTRAVENOUS | Status: DC
  Administered 2016-07-02 – 2016-07-05 (×7): 3 mL via INTRAVENOUS

## 2016-07-02 MED ORDER — FUROSEMIDE 40 MG PO TABS: 40.0000 mg | ORAL_TABLET | Freq: Every day | ORAL | Status: DC

## 2016-07-02 MED ORDER — METOPROLOL TARTRATE 50 MG PO TABS
100.0000 mg | ORAL_TABLET | Freq: Two times a day (BID) | ORAL | Status: DC
  Administered 2016-07-02 – 2016-07-06 (×7): 100 mg via ORAL
  Filled 2016-07-02 (×8): qty 2

## 2016-07-02 MED ORDER — TIOTROPIUM BROMIDE MONOHYDRATE 18 MCG IN CAPS
18.0000 ug | ORAL_CAPSULE | Freq: Every day | RESPIRATORY_TRACT | Status: DC
  Administered 2016-07-03 – 2016-07-04 (×2): 18 ug via RESPIRATORY_TRACT
  Filled 2016-07-02: qty 5

## 2016-07-02 MED ORDER — ONDANSETRON HCL 4 MG PO TABS: 4.0000 mg | ORAL_TABLET | Freq: Four times a day (QID) | ORAL | Status: DC | PRN

## 2016-07-02 MED ORDER — ALBUTEROL SULFATE (2.5 MG/3ML) 0.083% IN NEBU
2.5000 mg | INHALATION_SOLUTION | Freq: Four times a day (QID) | RESPIRATORY_TRACT | Status: DC
  Administered 2016-07-02: 2.5 mg via RESPIRATORY_TRACT
  Filled 2016-07-02: qty 3

## 2016-07-02 MED ORDER — MOMETASONE FURO-FORMOTEROL FUM 200-5 MCG/ACT IN AERO
2.0000 | INHALATION_SPRAY | Freq: Two times a day (BID) | RESPIRATORY_TRACT | Status: DC
  Administered 2016-07-02 – 2016-07-06 (×8): 2 via RESPIRATORY_TRACT
  Filled 2016-07-02: qty 8.8

## 2016-07-02 MED ORDER — ACETAMINOPHEN 325 MG PO TABS
325.0000 mg | ORAL_TABLET | Freq: Every day | ORAL | Status: DC
  Administered 2016-07-02: 325 mg via ORAL
  Filled 2016-07-02: qty 1

## 2016-07-02 MED ORDER — MONTELUKAST SODIUM 10 MG PO TABS
10.0000 mg | ORAL_TABLET | Freq: Every day | ORAL | Status: DC
  Administered 2016-07-02 – 2016-07-05 (×4): 10 mg via ORAL
  Filled 2016-07-02 (×4): qty 1

## 2016-07-02 MED ORDER — HEPARIN SODIUM (PORCINE) 5000 UNIT/ML IJ SOLN
5000.0000 [IU] | Freq: Three times a day (TID) | INTRAMUSCULAR | Status: DC
  Administered 2016-07-02 – 2016-07-04 (×6): 5000 [IU] via SUBCUTANEOUS
  Filled 2016-07-02 (×6): qty 1

## 2016-07-02 MED ORDER — ACETAMINOPHEN 650 MG RE SUPP
650.0000 mg | Freq: Four times a day (QID) | RECTAL | Status: DC | PRN
Start: 2016-07-02 — End: 2016-07-03

## 2016-07-02 MED ORDER — DOCUSATE SODIUM 100 MG PO CAPS
100.0000 mg | ORAL_CAPSULE | Freq: Two times a day (BID) | ORAL | Status: DC
  Administered 2016-07-02 – 2016-07-04 (×5): 100 mg via ORAL
  Filled 2016-07-02 (×5): qty 1

## 2016-07-02 MED ORDER — PNEUMOCOCCAL VAC POLYVALENT 25 MCG/0.5ML IJ INJ
0.5000 mL | INJECTION | INTRAMUSCULAR | Status: AC
  Administered 2016-07-03: 0.5 mL via INTRAMUSCULAR
  Filled 2016-07-02: qty 0.5

## 2016-07-02 MED ORDER — DILTIAZEM HCL ER COATED BEADS 240 MG PO CP24
240.0000 mg | ORAL_CAPSULE | Freq: Every day | ORAL | Status: DC
  Administered 2016-07-03 – 2016-07-06 (×4): 240 mg via ORAL
  Filled 2016-07-02 (×9): qty 1

## 2016-07-02 MED ORDER — IPRATROPIUM BROMIDE 0.02 % IN SOLN
0.5000 mg | Freq: Four times a day (QID) | RESPIRATORY_TRACT | Status: DC
  Administered 2016-07-02: 0.5 mg via RESPIRATORY_TRACT
  Filled 2016-07-02: qty 2.5

## 2016-07-02 MED ORDER — CEFTRIAXONE SODIUM 1 G IJ SOLR: 1.0000 g | INTRAMUSCULAR | Status: DC

## 2016-07-02 MED ORDER — CETYLPYRIDINIUM CHLORIDE 0.05 % MT LIQD
7.0000 mL | Freq: Two times a day (BID) | OROMUCOSAL | Status: DC
  Administered 2016-07-02 – 2016-07-06 (×5): 7 mL via OROMUCOSAL

## 2016-07-02 MED ORDER — SODIUM CHLORIDE 0.9% FLUSH: 3.0000 mL | INTRAVENOUS | Status: DC | PRN

## 2016-07-02 MED ORDER — ALBUTEROL SULFATE HFA 108 (90 BASE) MCG/ACT IN AERS: 2.0000 | INHALATION_SPRAY | RESPIRATORY_TRACT | Status: DC | PRN

## 2016-07-02 MED ORDER — IPRATROPIUM-ALBUTEROL 0.5-2.5 (3) MG/3ML IN SOLN
3.0000 mL | Freq: Four times a day (QID) | RESPIRATORY_TRACT | Status: DC
  Administered 2016-07-02 – 2016-07-06 (×13): 3 mL via RESPIRATORY_TRACT
  Filled 2016-07-02 (×14): qty 3

## 2016-07-02 MED ORDER — DIGOXIN 125 MCG PO TABS
0.2500 mg | ORAL_TABLET | Freq: Every day | ORAL | Status: DC
Start: 2016-07-02 — End: 2016-07-06
  Administered 2016-07-03 – 2016-07-06 (×4): 0.25 mg via ORAL
  Filled 2016-07-02 (×4): qty 2

## 2016-07-02 MED ORDER — VITAMIN D 1000 UNITS PO TABS
1000.0000 [IU] | ORAL_TABLET | Freq: Every day | ORAL | Status: DC
  Administered 2016-07-03 – 2016-07-06 (×4): 1000 [IU] via ORAL
  Filled 2016-07-02 (×4): qty 1

## 2016-07-02 MED ORDER — ONDANSETRON HCL 4 MG/2ML IJ SOLN: 4.0000 mg | Freq: Four times a day (QID) | INTRAMUSCULAR | Status: DC | PRN

## 2016-07-02 MED ORDER — SODIUM CHLORIDE 0.9 % IV SOLN: 250.0000 mL | INTRAVENOUS | Status: DC | PRN

## 2016-07-02 MED ORDER — LISINOPRIL 5 MG PO TABS
5.0000 mg | ORAL_TABLET | Freq: Every day | ORAL | Status: DC
  Administered 2016-07-03 – 2016-07-06 (×4): 5 mg via ORAL
  Filled 2016-07-02 (×4): qty 1

## 2016-07-02 MED ORDER — PIPERACILLIN-TAZOBACTAM 3.375 G IVPB
3.3750 g | Freq: Three times a day (TID) | INTRAVENOUS | Status: DC
  Administered 2016-07-02 – 2016-07-06 (×10): 3.375 g via INTRAVENOUS
  Filled 2016-07-02 (×17): qty 50

## 2016-07-02 MED ORDER — DEXTROSE 5 % IV SOLN
1.0000 g | Freq: Once | INTRAVENOUS | Status: DC
  Administered 2016-07-02: 1 g via INTRAVENOUS
  Filled 2016-07-02: qty 10

## 2016-07-02 NOTE — Progress Notes (Signed)
Pt came to floor at 1500. VSS. Pt was oriented to room and safety plan. Yellow socks in place.

## 2016-07-02 NOTE — H&P (Addendum)
[facility] NAME: Elaine Decker    MR#:  [id number]  DATE OF BIRTH:  27-Mar-1946  DATE OF ADMISSION:  07/02/2016  PRIMARY CARE PHYSICIAN: Singh,Jasmine, MD   REQUESTING/REFERRING PHYSICIAN:  CHIEF COMPLAINT:  No chief complaint on file.   HISTORY OF PRESENT ILLNESS:  Elaine Decker  is a 70 y.o. female with a known history of Hypertension, COPD, congestive heart failure sent in from Dr. Heidi Dach office because of worsening respiratory distress. Patient has been having shortness of breath and cough for the last 3 days getting progressively worse. So she went to see Dr. Humphrey Rolls today. Patient usually uses 2 L of oxygen but the since yesterday she is using status of oxygen. Patient is sent in here because of worsening shortness of breath.. Dr. Christian Mate Chest X-Ray Which Showed Bilateral Pneumonia. So Started on Antibiotics. She also complains of low-grade temperature for the past 2 days. Patient fell at home on July 4, complains of right leg pain since then. Right leg is swollen than the left leg. Patient had history of pleural effusion, had thoracocentesis in April, 800 mL of fluid was drained that time. Patient also had history of mission in February of this year for multifocal pneumonia, bronchoscopy with BAL showed Pseudomonas that time. She does have history of CHF with EF 25-30%.   PAST MEDICAL HISTORY:   Past Medical History  Diagnosis Date  . COPD (chronic obstructive pulmonary disease) ([place])   . Hypertension   . Asthma   . CHF (congestive heart failure) ([place])   . A-fib ([place])   . CKD (chronic kidney disease)   . HLD (hyperlipidemia)     PAST SURGICAL HISTOIRY:   Past Surgical History  Procedure Laterality Date  . Surgery on the neck    . Flexible bronchoscopy N/A 02/01/2016    Procedure: FLEXIBLE BRONCHOSCOPY;  Surgeon: Allyne Gee, MD;  Location: [facility] ORS;  Service: Pulmonary;  Laterality: N/A;    SOCIAL  HISTORY:   Social History  Substance Use Topics  . Smoking status: Former Smoker -- 2.00 packs/day for 50 years    Quit date: 11/27/2015  . Smokeless tobacco: Never Used  . Alcohol Use: No    FAMILY HISTORY:   Family History  Problem Relation Age of Onset  . CAD Mother   . Lung cancer Father   . Arrhythmia Brother     DRUG ALLERGIES:  No Known Allergies  REVIEW OF SYSTEMS:  CONSTITUTIONAL: No fever, fatigue or weakness.  EYES: No blurred or double vision.  EARS, NOSE, AND THROAT: No tinnitus or ear pain.  RESPIRATORY: Cough, shortness of breath for 3 days. No hemoptysis. He is using increased amount of oxygen. CARDIOVASCULAR: No chest pain, orthopnea, edema.  GASTROINTESTINAL: No nausea, vomiting, diarrhea or abdominal pain.  GENITOURINARY: No dysuria, hematuria.  ENDOCRINE: No polyuria, nocturia,  HEMATOLOGY: No anemia, easy bruising or bleeding SKIN: No rash or lesion. MUSCULOSKELETAL: No joint pain or arthritis.   NEUROLOGIC: No tingling, numbness, weakness.  PSYCHIATRY: No anxiety or depression.   MEDICATIONS AT HOME:   Prior to Admission medications   Medication Sig Start Date End Date Taking? Authorizing Provider  acetaminophen (TYLENOL) 325 MG tablet Take 325-650 mg by mouth at bedtime.    Historical Provider, MD  albuterol (PROVENTIL HFA;VENTOLIN HFA) 108 (90 Base) MCG/ACT inhaler Inhale 2 puffs into the lungs every 4 (four) hours as needed for wheezing or shortness of breath.  Historical Provider, MD  atorvastatin (LIPITOR) 20 MG tablet Take 20 mg by mouth daily.    Historical Provider, MD  cholecalciferol (VITAMIN D) 1000 UNITS tablet Take 1,000 Units by mouth daily.    Historical Provider, MD  dabigatran (PRADAXA) 150 MG CAPS capsule Take 150 mg by mouth 2 (two) times daily.    Historical Provider, MD  digoxin (LANOXIN) 0.25 MG tablet Take 1 tablet (0.25 mg total) by mouth daily. 06/26/16   Alisa Graff, FNP  diltiazem (DILACOR XR) 240 MG 24 hr capsule  Take 240 mg by mouth daily.    Historical Provider, MD  Fluticasone-Salmeterol (ADVAIR) 250-50 MCG/DOSE AEPB Inhale 1 puff into the lungs 2 (two) times daily.    Historical Provider, MD  furosemide (LASIX) 40 MG tablet Take 1 tablet (40 mg total) by mouth daily. 03/26/16   Gladstone Lighter, MD  ipratropium-albuterol (DUONEB) 0.5-2.5 (3) MG/3ML SOLN Take 3 mLs by nebulization every 6 (six) hours. 11/30/15   Fritzi Mandes, MD  levofloxacin (LEVAQUIN) 500 MG tablet Take 500 mg by mouth daily.    Historical Provider, MD  lisinopril (PRINIVIL,ZESTRIL) 5 MG tablet Take 1 tablet (5 mg total) by mouth daily. 05/25/16   Alisa Graff, FNP  metoprolol (LOPRESSOR) 100 MG tablet Take 1 tablet (100 mg total) by mouth 2 (two) times daily. 03/26/16   Gladstone Lighter, MD  montelukast (SINGULAIR) 10 MG tablet Take 10 mg by mouth at bedtime.     Historical Provider, MD  Multiple Vitamin (MULTIVITAMIN) tablet Take 1 tablet by mouth daily.    Historical Provider, MD  PrednisoLONE 5 MG (21) TBPK Take by mouth as directed.    Historical Provider, MD  tiotropium (SPIRIVA) 18 MCG inhalation capsule Place 18 mcg into inhaler and inhale daily.    Historical Provider, MD      VITAL SIGNS:  Blood pressure 149/57, pulse 69, temperature 99 F (37.2 C), temperature source Oral, resp. rate 22, height 5\' 3"  (1.6 m), weight 70.534 kg (155 lb 8 oz), SpO2 95 %.  PHYSICAL EXAMINATION:  GENERAL:  70 y.o.-year-old patient lying in the bed with no acute distress.  EYES: Pupils equal, round, reactive to light and accommodation. No scleral icterus. Extraocular muscles intact.  HEENT: Head atraumatic, normocephalic. Oropharynx and nasopharynx clear.  NECK:  Supple, no jugular venous distention. No thyroid enlargement, no tenderness.  LUNGS:Decreased breath sounds bilaterally, no wheezing. CARDIOVASCULAR: S1, S2 normal. No murmurs, rubs, or gallops.  ABDOMEN: Soft, nontender, nondistended. Bowel sounds present. No organomegaly or mass.   EXTREMITIES:  Right leg edema,tender to palpation in the right leg area.  No Left leg edema. NEUROLOGIC: Cranial nerves II through XII are intact. Muscle strength 5/5 in all extremities. Sensation intact. Gait not checked.  PSYCHIATRIC: The patient is alert and oriented x 3.  SKIN: No obvious rash, lesion, or ulcer.   LABORATORY PANEL:   CBC  Recent Labs Lab 07/02/16 1623  WBC 16.2*  HGB 10.8*  HCT 33.9*  PLT 189   ------------------------------------------------------------------------------------------------------------------  Chemistries  No results for input(s): NA, K, CL, CO2, GLUCOSE, BUN, CREATININE, CALCIUM, MG, AST, ALT, ALKPHOS, BILITOT in the last 168 hours.  Invalid input(s): GFRCGP ------------------------------------------------------------------------------------------------------------------  Cardiac Enzymes No results for input(s): TROPONINI in the last 168 hours. ------------------------------------------------------------------------------------------------------------------  RADIOLOGY:  X-ray Chest Pa And Lateral  07/02/2016  CLINICAL DATA:  Short of breath EXAM: CHEST  2 VIEW COMPARISON:  06/05/2016 FINDINGS: Advanced cardiomegaly. Pulmonary vasculature is indistinct. Bilateral central and basilar ill-defined pulmonary opacities have  developed. No pneumothorax or pleural effusion. IMPRESSION: Interval development of bilateral ill-defined pulmonary opacities which are nonspecific. Findings may represent an inflammatory process. Followup PA and lateral chest X-ray is recommended in 3-4 weeks following trial of antibiotic therapy to ensure resolution and exclude underlying malignancy. Atypical pulmonary edema and malignancy are secondary considerations. Electronically Signed   By: Marybelle Killings M.D.   On: 07/02/2016 16:18    EKG:   Orders placed or performed during the hospital encounter of 03/18/16  . EKG 12-Lead  . EKG 12-Lead  . EKG  Not done as it's a  direct admit, we will order one.   IMPRESSION AND PLAN:    1. acute on chronic respiratory failure secondary to bilateral pneumonia: She has her recurrent pneumonia as: Had history of thoracocentesis. Patient is on Zosyn, continue oxygen, WBC secondary to pneumonia: Follow blood cultures. Because of recent Pseudomonas infection started on Zosyn. And discontinued the Levaquin, Rocephin as per pharmacy recommendation.  2 elevated AST, ALT likely congestive hepatopathy: No abdominal pain, no nausea..Hold  statins, and ultrasound of abdomen, GI consult, follow daily LFTs.  #3 history of atrial fibrillation: Rate controlled, continue Cardizem CD 240 mg /continue Lopressor, monitor on off unit telemetry., Continue Pradaxa. No dose adjustment is needed in hepatic impairment as per up to date.  4 COPD: Continue Advair, Spiriva. #5 chronic systolic congestive heart failure: Patient is on lisinopril, Lasix, digoxin. Follows up at CHF clinic. Recent  echo showed EF 25-30% by echo done in February of this year. 6.right leg swelling ,pain after fall;check for DVT   All the records are reviewed and case discussed with ED provider. Management plans discussed with the patient, family and they are in agreement.  CODE STATUS: Full  TOTAL TIME TAKING CARE OF THIS PATIENT: 55  minutes.    Epifanio Lesches M.D on 07/02/2016 at 5:33 PM  Between 7am to 6pm - Pager - [phone]  After 6pm go to [email or web address] - password [email or web address]  Office  [phone]  CC: Primary care physician; Glendon Axe, MD  Note: This dictation was prepared with Dragon dictation along with smaller phrase technology. Any transcriptional errors that result from this process are unintentional.

## 2016-07-02 NOTE — Progress Notes (Signed)
Pharmacy Antibiotic Note  Elaine Decker is a 70 y.o. female admitted on 07/02/2016 with CAP.  Pharmacy has been consulted for Zosyn dosing. Patient with a history of pseudomonas pneumonia in February 2017.  Therefore, MD desires pseudomonal coverage.   Plan: Zosyn 3.375g IV q8h (4 hour infusion).  Height: 5\' 3"  (160 cm) Weight: 155 lb 8 oz (70.534 kg) IBW/kg (Calculated) : 52.4  Temp (24hrs), Avg:99 F (37.2 C), Min:99 F (37.2 C), Max:99 F (37.2 C)   Recent Labs Lab 07/02/16 1623  WBC 16.2*  CREATININE 1.12*    Estimated Creatinine Clearance: 45.9 mL/min (by C-G formula based on Cr of 1.12).    No Known Allergies  Antimicrobials this admission: 7/17 Zosyn >>    Microbiology results: None at this time  Thank you for allowing pharmacy to be a part of this patient's care.  Artis Buechele G 07/02/2016 5:58 PM

## 2016-07-03 ENCOUNTER — Inpatient Hospital Stay: Payer: Medicare Other

## 2016-07-03 DIAGNOSIS — J440 Chronic obstructive pulmonary disease with acute lower respiratory infection: Secondary | ICD-10-CM | POA: Diagnosis not present

## 2016-07-03 LAB — GLUCOSE, CAPILLARY
GLUCOSE-CAPILLARY: 194 mg/dL — AB (ref 65–99)
Glucose-Capillary: 245 mg/dL — ABNORMAL HIGH (ref 65–99)
Glucose-Capillary: 181 mg/dL — ABNORMAL HIGH (ref 65–99)

## 2016-07-03 LAB — COMPREHENSIVE METABOLIC PANEL
ALT: 1845 U/L — AB (ref 14–54)
AST: 373 U/L — AB (ref 15–41)
Albumin: 3 g/dL — ABNORMAL LOW (ref 3.5–5.0)
Alkaline Phosphatase: 86 U/L (ref 38–126)
Anion gap: 4 — ABNORMAL LOW (ref 5–15)
BILIRUBIN TOTAL: 1.2 mg/dL (ref 0.3–1.2)
BUN: 38 mg/dL — AB (ref 6–20)
CO2: 34 mmol/L — ABNORMAL HIGH (ref 22–32)
CREATININE: 0.81 mg/dL (ref 0.44–1.00)
Calcium: 8.3 mg/dL — ABNORMAL LOW (ref 8.9–10.3)
Chloride: 105 mmol/L (ref 101–111)
GFR calc Af Amer: >60 mL/min (ref >60)
Glucose, Bld: 175 mg/dL — ABNORMAL HIGH (ref 65–99)
Potassium: 4.7 mmol/L (ref 3.5–5.1)
Sodium: 143 mmol/L (ref 135–145)
TOTAL PROTEIN: 6.2 g/dL — AB (ref 6.5–8.1)

## 2016-07-03 LAB — ACETAMINOPHEN LEVEL: Acetaminophen (Tylenol), Serum: <10 ug/mL — ABNORMAL LOW (ref 10–30)

## 2016-07-03 LAB — CBC
HCT: 32.5 % — ABNORMAL LOW (ref 35.0–47.0)
HEMOGLOBIN: 10.8 g/dL — AB (ref 12.0–16.0)
MCH: 29.9 pg (ref 26.0–34.0)
MCHC: 33.2 g/dL (ref 32.0–36.0)
MCV: 89.9 fL (ref 80.0–100.0)
Platelets: 204 10*3/uL (ref 150–440)
RBC: 3.61 MIL/uL — AB (ref 3.80–5.20)
RDW: 16.2 % — ABNORMAL HIGH (ref 11.5–14.5)
WBC: 13.9 10*3/uL — ABNORMAL HIGH (ref 3.6–11.0)

## 2016-07-03 MED ORDER — ACETAMINOPHEN 325 MG PO TABS
650.0000 mg | ORAL_TABLET | Freq: Two times a day (BID) | ORAL | Status: DC
  Administered 2016-07-03: 650 mg via ORAL
  Filled 2016-07-03: qty 2

## 2016-07-03 MED ORDER — INSULIN ASPART 100 UNIT/ML ~~LOC~~ SOLN
0.0000 [IU] | Freq: Three times a day (TID) | SUBCUTANEOUS | Status: DC
  Administered 2016-07-03: 3 [IU] via SUBCUTANEOUS
  Administered 2016-07-04 (×3): 2 [IU] via SUBCUTANEOUS
  Administered 2016-07-05: 1 [IU] via SUBCUTANEOUS
  Administered 2016-07-05: 3 [IU] via SUBCUTANEOUS
  Administered 2016-07-06: 1 [IU] via SUBCUTANEOUS
  Administered 2016-07-06: 2 [IU] via SUBCUTANEOUS
  Filled 2016-07-03: qty 2
  Filled 2016-07-03 (×2): qty 1
  Filled 2016-07-03: qty 3
  Filled 2016-07-03 (×3): qty 2
  Filled 2016-07-03: qty 3

## 2016-07-03 MED ORDER — ACETAMINOPHEN 325 MG PO TABS: 650.0000 mg | ORAL_TABLET | Freq: Two times a day (BID) | ORAL | Status: DC | PRN

## 2016-07-03 MED ORDER — FUROSEMIDE 10 MG/ML IJ SOLN
20.0000 mg | Freq: Two times a day (BID) | INTRAMUSCULAR | Status: DC
  Administered 2016-07-03 – 2016-07-05 (×5): 20 mg via INTRAVENOUS
  Filled 2016-07-03 (×6): qty 2

## 2016-07-03 MED ORDER — METHYLPREDNISOLONE SODIUM SUCC 125 MG IJ SOLR
60.0000 mg | Freq: Two times a day (BID) | INTRAMUSCULAR | Status: DC
  Administered 2016-07-03 – 2016-07-04 (×2): 60 mg via INTRAVENOUS
  Filled 2016-07-03 (×4): qty 2

## 2016-07-03 NOTE — Progress Notes (Signed)
Initial Nutrition Assessment  DOCUMENTATION CODES:   Not applicable  INTERVENTION:  -Monitor intake and cater to pt preferences -Recommend Ensure Enlive po BID, each supplement provides 350 kcal and 20 grams of protein    NUTRITION DIAGNOSIS:   Inadequate oral intake related to chronic illness as evidenced by per patient/family report.    GOAL:   Patient will meet greater than or equal to 90% of their needs    MONITOR:   PO intake, Supplement acceptance  REASON FOR ASSESSMENT:   Consult Assessment of nutrition requirement/status  ASSESSMENT:      Pt admitted with respiratory distress, bilateral pneumonia, elevated AST/ALT  Past Medical History  Diagnosis Date  . COPD (chronic obstructive pulmonary disease) ([place])   . Hypertension   . Asthma   . CHF (congestive heart failure) ([place])   . A-fib ([place])   . CKD (chronic kidney disease)   . HLD (hyperlipidemia)    Pt reports appetite has been good during admission but reports intake has been decreased for the past 3 years prior to admission.  Reports eating 50% of normal intake secondary to no appetite  Medications reviewed: colace, lasix, aspart, MVI  Labs reviewed: BUN 38, creatinine WDL, AST 373, ALT 1845, WBC 13.9  Nutrition-Focused physical exam completed. Findings are WDL for fat depletion, muscle depletion, and mild edema.    Diet Order:  Diet heart healthy/carb modified Room service appropriate?: Yes; Fluid consistency:: Thin  Skin:  Reviewed, no issues  Last BM:  7/13  Height:   Ht Readings from Last 1 Encounters:  07/02/16 5\' 3"  (1.6 m)    Weight: 26% wt loss in the last 3 years per pt.   Wt Readings from Last 1 Encounters:  07/02/16 155 lb 8 oz (70.534 kg)    Ideal Body Weight:     BMI:  Body mass index is 27.55 kg/(m^2).  Estimated Nutritional Needs:   Kcal:  1750-2100 kcals/d  Protein:  84-105 g/d  Fluid:  >/= 1755ml/d  EDUCATION NEEDS:   No education needs identified at  this time  Ekaterini Capitano B. Zenia Resides, [place], [place] (pager) Weekend/On-Call pager [phone])

## 2016-07-03 NOTE — Progress Notes (Signed)
[place] [facility]                                                                                                                                                                                            Patient Demographics   Elaine Decker, is a 70 y.o. female, DOB - 1946-06-13, [id number]  Admit date - 07/02/2016   Admitting Physician Henreitta Leber, MD  Outpatient Primary MD for the patient is Singh,Jasmine, MD   LOS - 1  Subjective:     Review of Systems:   CONSTITUTIONAL: No documented fever. No fatigue, weakness. No weight gain, no weight loss.  EYES: No blurry or double vision.  ENT: No tinnitus. No postnasal drip. No redness of the oropharynx.  RESPIRATORY: No cough, no wheeze, no hemoptysis. No dyspnea.  CARDIOVASCULAR: No chest pain. No orthopnea. No palpitations. No syncope.  GASTROINTESTINAL: No nausea, no vomiting or diarrhea. No abdominal pain. No melena or hematochezia.  GENITOURINARY: No dysuria or hematuria.  ENDOCRINE: No polyuria or nocturia. No heat or cold intolerance.  HEMATOLOGY: No anemia. No bruising. No bleeding.  INTEGUMENTARY: No rashes. No lesions.  MUSCULOSKELETAL: No arthritis. No swelling. No gout.  NEUROLOGIC: No numbness, tingling, or ataxia. No seizure-type activity.  PSYCHIATRIC: No anxiety. No insomnia. No ADD.    Vitals:   Filed Vitals:   07/03/16 0524 07/03/16 0731 07/03/16 0807 07/03/16 1319  BP: 121/79  147/71   Pulse: 91  86   Temp: 97.8 F (36.6 C)  97.8 F (36.6 C)   TempSrc: Oral  Oral   Resp: 19  18   Height:      Weight:      SpO2: 92% 94% 93% 91%    Wt Readings from Last 3 Encounters:  07/02/16 70.534 kg (155 lb 8 oz)  05/04/16 73.483 kg (162 lb)  04/10/16 71.215 kg (157 lb)     Intake/Output Summary (Last 24 hours) at 07/03/16 1338 Last data filed at 07/03/16 1247  Gross per 24 hour  Intake    840 ml  Output    650 ml  Net    190 ml    Physical Exam:    GENERAL: Pleasant-appearing in no apparent distress.  HEAD, EYES, EARS, NOSE AND THROAT: Atraumatic, normocephalic. Extraocular muscles are intact. Pupils equal and reactive to light. Sclerae anicteric. No conjunctival injection. No oro-pharyngeal erythema.  NECK: Supple. There is no jugular venous distention. No bruits, no lymphadenopathy, no thyromegaly.  HEART: Regular rate and rhythm,. No murmurs, no rubs, no clicks.  LUNGS: Clear to auscultation bilaterally. No rales or rhonchi. No wheezes.  ABDOMEN: Soft, flat, nontender, nondistended. Has good bowel sounds. No hepatosplenomegaly appreciated.  EXTREMITIES: No evidence of any cyanosis, clubbing, or peripheral edema.  +2 pedal and radial pulses bilaterally.  NEUROLOGIC: The patient is alert, awake, and oriented x3 with no focal motor or sensory deficits appreciated bilaterally.  SKIN: Moist and warm with no rashes appreciated.  Psych: Not anxious, depressed LN: No inguinal LN enlargement    Antibiotics   Anti-infectives    Start     Dose/Rate Route Frequency Ordered Stop   07/03/16 1800  cefTRIAXone (ROCEPHIN) 1 g in dextrose 5 % 50 mL IVPB  Status:  Discontinued     1 g 100 mL/hr over 30 Minutes Intravenous Every 24 hours 07/02/16 1516 07/02/16 1702   07/02/16 1715  piperacillin-tazobactam (ZOSYN) IVPB 3.375 g     3.375 g 12.5 mL/hr over 240 Minutes Intravenous Every 8 hours 07/02/16 1702     07/02/16 1530  cefTRIAXone (ROCEPHIN) 1 g in dextrose 5 % 50 mL IVPB  Status:  Discontinued     1 g 100 mL/hr over 30 Minutes Intravenous  Once 07/02/16 1512 07/02/16 1729      Medications   Scheduled Meds: . antiseptic oral rinse  7 mL Mouth Rinse BID  . cholecalciferol  1,000 Units Oral Daily  . digoxin  0.25 mg Oral Daily  . diltiazem  240 mg Oral Daily  . docusate sodium  100 mg Oral BID  . furosemide  20 mg Intravenous BID  . heparin  5,000 Units Subcutaneous Q8H  . insulin aspart  0-9 Units Subcutaneous TID WC  .  ipratropium-albuterol  3 mL Nebulization Q6H  . lisinopril  5 mg Oral Daily  . methylPREDNISolone (SOLU-MEDROL) injection  60 mg Intravenous Q12H  . metoprolol  100 mg Oral BID  . mometasone-formoterol  2 puff Inhalation BID  . montelukast  10 mg Oral QHS  . multivitamin with minerals  1 tablet Oral Daily  . piperacillin-tazobactam (ZOSYN)  IV  3.375 g Intravenous Q8H  . sodium chloride flush  3 mL Intravenous Q12H  . sodium chloride flush  3 mL Intravenous Q12H  . tiotropium  18 mcg Inhalation Daily   Continuous Infusions:  PRN Meds:.sodium chloride, acetaminophen **OR** [DISCONTINUED] acetaminophen, albuterol, ondansetron **OR** ondansetron (ZOFRAN) IV, polyethylene glycol, sodium chloride flush   Data Review:   Micro Results No results found for this or any previous visit (from the past 240 hour(s)).  Radiology Reports X-ray Chest Pa And Lateral  07/02/2016  CLINICAL DATA:  Short of breath EXAM: CHEST  2 VIEW COMPARISON:  06/05/2016 FINDINGS: Advanced cardiomegaly. Pulmonary vasculature is indistinct. Bilateral central and basilar ill-defined pulmonary opacities have developed. No pneumothorax or pleural effusion. IMPRESSION: Interval development of bilateral ill-defined pulmonary opacities which are nonspecific. Findings may represent an inflammatory process. Followup PA and lateral chest X-ray is recommended in 3-4 weeks following trial of antibiotic therapy to ensure resolution and exclude underlying malignancy. Atypical pulmonary edema and malignancy are secondary considerations. Electronically Signed   By: Marybelle Killings M.D.   On: 07/02/2016 16:18   Dg Chest 2 View  06/05/2016  CLINICAL DATA:  Follow-up pleural effusions, patient has a and mildly productive cough, history of pneumonia, CHF, asthma -COPD. EXAM: CHEST  2 VIEW COMPARISON:  PA and lateral chest x-ray of May 01, 2016 FINDINGS: The lungs are hyperinflated. The interstitial markings are less conspicuous. Only a trace of  pleural fluid remains on the right. The pleural effusion on the left has resolved.  There are no alveolar infiltrates. The cardiac silhouette remains enlarged. The pulmonary vascularity is not engorged. The mediastinum is normal in width. There is stable tortuosity of the descending thoracic aorta. There are old lateral rib deformities bilaterally. IMPRESSION: COPD. Decreased interstitial edema and bibasilar atelectasis. Interval near total resolution of the pleural effusions. Electronically Signed   By: David  Martinique M.D.   On: 06/05/2016 12:09   Ct Chest W Contrast  06/05/2016  CLINICAL DATA:  Follow up pneumonia. Patient reports multiple hospitalizations for pneumonia over the last 6 months. No history of malignancy. EXAM: CT CHEST WITH CONTRAST TECHNIQUE: Multidetector CT imaging of the chest was performed during intravenous contrast administration. CONTRAST:  81mL ISOVUE-300 IOPAMIDOL (ISOVUE-300) INJECTION 61% COMPARISON:  Chest CTs ranging from 11/30/2015 through 02/28/2016. Chest radiographs 06/05/2016. FINDINGS: Cardiovascular: There is stable cardiomegaly. No significant pericardial effusion. There is diffuse atherosclerosis of the aorta, great vessels and coronary arteries. There is central enlargement of the pulmonary arteries consistent with pulmonary arterial hypertension. No acute vascular findings are seen. Mediastinum/Nodes: There are no enlarged mediastinal, hilar or axillary lymph nodes.Scattered small mediastinal lymph nodes are unchanged. The thyroid gland, trachea and esophagus demonstrate no significant findings. Lungs/Pleura: There is no significant residual pleural effusion. Moderate emphysematous changes are again noted. There is improved aeration of the lung bases with mild residual patchy linear density, likely atelectasis or scarring. There is no residual focal right middle lobe airspace disease, although there is a new 1.6 cm inflammatory focus in the right lower lobe on images 88  through 93. There is also a new anterior left upper lobe infiltrate measuring up to 3.3 x 1.8 cm on image 37. No endobronchial lesion seen. Upper abdomen: Stable bilateral adrenal adenomas. No acute findings are seen within the visualized upper abdomen. Musculoskeletal/Chest wall: There is no chest wall mass or suspicious osseous finding. IMPRESSION: 1. Interval resolution of right pleural effusion and improved aeration of both lung bases. 2. There are new focal opacities in the right lower lobe and left upper lobe which are most consistent with focal inflammation. Given the fluctuating nature of these findings on serial examinations, consider atypical infection (such as fungal) and noninfectious inflammation. 3. Underlying moderate emphysema and central pulmonary arterial enlargement consistent with pulmonary arterial hypertension. 4. Cardiomegaly and atherosclerosis noted. Electronically Signed   By: Richardean Sale M.D.   On: 06/05/2016 13:48   US Abdomen Complete  07/03/2016  CLINICAL DATA:  Elevated liver function tests. EXAM: ABDOMEN ULTRASOUND COMPLETE COMPARISON:  None. FINDINGS: Gallbladder: Multiple gallstones are seen measuring up to 1.6 cm. No evidence of gallbladder dilatation or wall thickening. No sonographic Murphy sign noted by sonographer. Common bile duct: Diameter: 5 mm, within normal limits. Liver: No focal lesion identified. Within normal limits in parenchymal echogenicity. Portal vein is patent. IVC: No abnormality visualized. Pancreas: Visualized portion unremarkable. Spleen: Size and appearance within normal limits. Right Kidney: Length: 10.8 cm. Echogenicity within normal limits. Small cyst noted in upper pole of right kidney measuring 1.5 cm. No mass or hydronephrosis visualized. Left Kidney: Length: 10.9 cm. Echogenicity within normal limits. No mass or hydronephrosis visualized. Abdominal aorta: No aneurysm visualized. Other findings: None. IMPRESSION: Cholelithiasis. No sonographic  signs of acute cholecystitis or biliary dilatation. Unremarkable sonographic appearance of hepatic parenchyma. No liver mass identified. Electronically Signed   By: Earle Gell M.D.   On: 07/03/2016 10:57   US Venous Img Lower Unilateral Right  07/03/2016  CLINICAL DATA:  70 year old female with a history of swelling and  leg pain EXAM: RIGHT LOWER EXTREMITY VENOUS DOPPLER ULTRASOUND TECHNIQUE: Gray-scale sonography with graded compression, as well as color Doppler and duplex ultrasound were performed to evaluate the lower extremity deep venous systems from the level of the common femoral vein and including the common femoral, femoral, profunda femoral, popliteal and calf veins including the posterior tibial, peroneal and gastrocnemius veins when visible. The superficial great saphenous vein was also interrogated. Spectral Doppler was utilized to evaluate flow at rest and with distal augmentation maneuvers in the common femoral, femoral and popliteal veins. COMPARISON:  None. FINDINGS: Contralateral Common Femoral Vein: Respiratory phasicity is normal and symmetric with the symptomatic side. No evidence of thrombus. Normal compressibility. Common Femoral Vein: No evidence of thrombus. Normal compressibility, respiratory phasicity and response to augmentation. Saphenofemoral Junction: No evidence of thrombus. Normal compressibility and flow on color Doppler imaging. Profunda Femoral Vein: No evidence of thrombus. Normal compressibility and flow on color Doppler imaging. Femoral Vein: No evidence of thrombus. Normal compressibility, respiratory phasicity and response to augmentation. Popliteal Vein: No evidence of thrombus. Normal compressibility, respiratory phasicity and response to augmentation. Calf Veins: No evidence of thrombus. Normal compressibility and flow on color Doppler imaging. Superficial Great Saphenous Vein: No evidence of thrombus. Normal compressibility and flow on color Doppler imaging. Other  Findings: Lentiform anechoic collection in the popliteal fossa on the right measures 2.7 cm x 1.3 cm x 2.1 cm peer no internal flow. IMPRESSION: Sonographic survey of the right lower extremity negative for DVT. Likely Baker cyst in the right popliteal fossa. Signed, Dulcy Fanny. Earleen Newport, DO [facility] Electronically Signed   By: Corrie Mckusick D.O.   On: 07/03/2016 10:48     CBC  Recent Labs Lab 07/02/16 1623 07/03/16 0508  WBC 16.2* 13.9*  HGB 10.8* 10.8*  HCT 33.9* 32.5*  PLT 189 204  MCV 91.9 89.9  MCH 29.4 29.9  MCHC 32.0 33.2  RDW 16.1* 16.2*    Chemistries   Recent Labs Lab 07/02/16 1623 07/03/16 0508  NA 139 143  K 4.1 4.7  CL 101 105  CO2 32 34*  GLUCOSE 146* 175*  BUN 55* 38*  CREATININE 1.12* 0.81  CALCIUM 8.4* 8.3*  AST 690* 373*  ALT 2192* 1845*  ALKPHOS 96 86  BILITOT 1.4* 1.2   ------------------------------------------------------------------------------------------------------------------ estimated creatinine clearance is 63.4 mL/min (by C-G formula based on Cr of 0.81). ------------------------------------------------------------------------------------------------------------------ No results for input(s): HGBA1C in the last 72 hours. ------------------------------------------------------------------------------------------------------------------ No results for input(s): CHOL, HDL, LDLCALC, TRIG, CHOLHDL, LDLDIRECT in the last 72 hours. ------------------------------------------------------------------------------------------------------------------  Recent Labs  07/02/16 1623  TSH 0.637   ------------------------------------------------------------------------------------------------------------------ No results for input(s): VITAMINB12, FOLATE, FERRITIN, TIBC, IRON, RETICCTPCT in the last 72 hours.  Coagulation profile No results for input(s): INR, PROTIME in the last 168 hours.  No  results for input(s): DDIMER in the last 72 hours.  Cardiac Enzymes No results for input(s): CKMB, TROPONINI, MYOGLOBIN in the last 168 hours.  Invalid input(s): CK ------------------------------------------------------------------------------------------------------------------ Invalid input(s): [place]  Patient with sob respiratory failure pt has multifocal pneumonia and noted to have liver function   being elevated   1. acute on chronic respiratory failure secondary to bilateral pneumonia: She has her recurrent pneumonia  Patient wbc trending down, continue zosyn, pt has mulitple ct in past no need to repeat Check immunoglobulin level    2.Acute hepatitis- ultrasound of liver no acute abonormality, suspect due to passive congestion,  could be medication  induce, hypotention -- i will check hepatitis panel, GI consult, repeat lft's in am We will have pharmacy to review home medication   3. history of atrial fibrillation: Rate controlled, continue Cardizem CD 240 mg continue Lopressor,  , Continue Pradaxa.   4 COPD: Continue Advair, Spiriva.  5 Acute  On chronic systolic congestive heart failure: Patient is on lisinopril, Lasix, digoxin. Start patient on IV lasix    6.right leg swelling ,pain  Negative ultrasound for dvt     Code Status Orders        Start     Ordered   07/02/16 1513  Full code   Continuous     07/02/16 1512    Code Status History    Date Active Date Inactive Code Status Order ID Comments User Context   03/19/2016  2:41 AM 03/26/2016  3:56 PM Full Code [id number]  Saundra Shelling, MD Inpatient   01/19/2016  9:13 PM 01/24/2016  7:21 PM Full Code [id number]  Lance Coon, MD Inpatient   11/28/2015 11:41 AM 11/30/2015  5:51 PM Full Code [id number]  Loletha Grayer, MD ED           Consults  gi  DVT Prophylaxis   heparin  Lab Results  Component Value Date   PLT 204 07/03/2016     Time Spent in minutes  8min  Greater than 50% of  time spent in care coordination and counseling patient regarding the condition and plan of care.   Dustin Flock M.D on 07/03/2016 at 1:38 PM  Between 7am to 6pm - Pager - [phone]  After 6pm go to [email or web address] - password [email or web address] [place] [facility]   Office  [phone]

## 2016-07-04 LAB — GLUCOSE, CAPILLARY
GLUCOSE-CAPILLARY: 185 mg/dL — AB (ref 65–99)
GLUCOSE-CAPILLARY: 154 mg/dL — AB (ref 65–99)
Glucose-Capillary: 189 mg/dL — ABNORMAL HIGH (ref 65–99)
Glucose-Capillary: 185 mg/dL — ABNORMAL HIGH (ref 65–99)

## 2016-07-04 LAB — CBC
HEMATOCRIT: 31.2 % — AB (ref 35.0–47.0)
Hemoglobin: 10.3 g/dL — ABNORMAL LOW (ref 12.0–16.0)
MCH: 29.8 pg (ref 26.0–34.0)
MCHC: 33.1 g/dL (ref 32.0–36.0)
MCV: 90.1 fL (ref 80.0–100.0)
Platelets: 233 10*3/uL (ref 150–440)
RBC: 3.47 MIL/uL — ABNORMAL LOW (ref 3.80–5.20)
RDW: 15.9 % — AB (ref 11.5–14.5)
WBC: 15.2 10*3/uL — ABNORMAL HIGH (ref 3.6–11.0)

## 2016-07-04 LAB — COMPREHENSIVE METABOLIC PANEL
ALT: 1413 U/L — ABNORMAL HIGH (ref 14–54)
ANION GAP: 5 (ref 5–15)
AST: 173 U/L — ABNORMAL HIGH (ref 15–41)
Albumin: 2.9 g/dL — ABNORMAL LOW (ref 3.5–5.0)
Alkaline Phosphatase: 78 U/L (ref 38–126)
BILIRUBIN TOTAL: 0.9 mg/dL (ref 0.3–1.2)
BUN: 34 mg/dL — ABNORMAL HIGH (ref 6–20)
CO2: 36 mmol/L — ABNORMAL HIGH (ref 22–32)
Calcium: 8.5 mg/dL — ABNORMAL LOW (ref 8.9–10.3)
Chloride: 100 mmol/L — ABNORMAL LOW (ref 101–111)
Creatinine, Ser: 0.84 mg/dL (ref 0.44–1.00)
GFR calc Af Amer: >60 mL/min (ref >60)
Glucose, Bld: 211 mg/dL — ABNORMAL HIGH (ref 65–99)
POTASSIUM: 4 mmol/L (ref 3.5–5.1)
Sodium: 141 mmol/L (ref 135–145)
TOTAL PROTEIN: 5.9 g/dL — AB (ref 6.5–8.1)

## 2016-07-04 LAB — HEPATITIS PANEL, ACUTE
HCV Ab: 0.1 s/co ratio (ref 0.0–0.9)
HEP A IGM: NEGATIVE
HEP B C IGM: NEGATIVE
Hepatitis B Surface Ag: NEGATIVE

## 2016-07-04 LAB — ACETAMINOPHEN LEVEL

## 2016-07-04 MED ORDER — INSULIN DETEMIR 100 UNIT/ML ~~LOC~~ SOLN
12.0000 [IU] | Freq: Every day | SUBCUTANEOUS | Status: DC
  Administered 2016-07-04 – 2016-07-05 (×2): 12 [IU] via SUBCUTANEOUS
  Filled 2016-07-04 (×3): qty 0.12

## 2016-07-04 MED ORDER — METHYLPREDNISOLONE SODIUM SUCC 125 MG IJ SOLR
60.0000 mg | Freq: Every day | INTRAMUSCULAR | Status: DC
  Administered 2016-07-05: 60 mg via INTRAVENOUS
  Filled 2016-07-04: qty 2

## 2016-07-04 MED ORDER — SENNOSIDES-DOCUSATE SODIUM 8.6-50 MG PO TABS
1.0000 | ORAL_TABLET | Freq: Two times a day (BID) | ORAL | Status: DC
  Administered 2016-07-06: 1 via ORAL
  Filled 2016-07-04 (×5): qty 1

## 2016-07-04 MED ORDER — ENOXAPARIN SODIUM 40 MG/0.4ML ~~LOC~~ SOLN
40.0000 mg | SUBCUTANEOUS | Status: DC
  Administered 2016-07-04: 40 mg via SUBCUTANEOUS
  Filled 2016-07-04: qty 0.4

## 2016-07-04 NOTE — Progress Notes (Signed)
[place] [facility] [place] NAME: Elaine Decker    MR#:  [id number]  DATE OF BIRTH:  08-14-1946  SUBJECTIVE:   Pt. Here due to shortness of breath from COPD ExacerbationAnd possible pneumonia. LFTs improved since yesterday. Shortness of breath improved. Positive cough which is productive with yellow sputum.  REVIEW OF SYSTEMS:    Review of Systems  Constitutional: Negative for fever and chills.  HENT: Negative for congestion and tinnitus.   Eyes: Negative for blurred vision and double vision.  Respiratory: Positive for cough, sputum production and shortness of breath. Negative for wheezing.   Cardiovascular: Negative for chest pain, orthopnea and PND.  Gastrointestinal: Negative for nausea, vomiting, abdominal pain and diarrhea.  Genitourinary: Negative for dysuria and hematuria.  Neurological: Negative for dizziness, sensory change and focal weakness.  All other systems reviewed and are negative.   Nutrition: Heart Healthy/Carb Modified Tolerating Diet: yes Tolerating PT: Await Eval.    DRUG ALLERGIES:  No Known Allergies  VITALS:  Blood pressure 135/66, pulse 59, temperature 97.9 F (36.6 C), temperature source Oral, resp. rate 18, height 5\' 3"  (1.6 m), weight 70.534 kg (155 lb 8 oz), SpO2 96 %.  PHYSICAL EXAMINATION:   Physical Exam  GENERAL:  70 y.o.-year-old patient lying in the bed in no acute distress.  EYES: Pupils equal, round, reactive to light and accommodation. No scleral icterus. Extraocular muscles intact.  HEENT: Head atraumatic, normocephalic. Oropharynx and nasopharynx clear.  NECK:  Supple, no jugular venous distention. No thyroid enlargement, no tenderness.  LUNGS: Prolonged Insp & exp. phase, no wheezing, rales, rhonchi. No use of accessory muscles of respiration.  CARDIOVASCULAR: S1, S2 normal. 2/6 systolic murmur at RSB, No rubs, or gallops.  ABDOMEN: Soft, nontender, nondistended. Bowel sounds present. No  organomegaly or mass.  EXTREMITIES: No cyanosis, clubbing or edema b/l.    NEUROLOGIC: Cranial nerves II through XII are intact. No focal Motor or sensory deficits b/l.   PSYCHIATRIC: The patient is alert and oriented x 3.  SKIN: No obvious rash, lesion, or ulcer.    LABORATORY PANEL:   CBC  Recent Labs Lab 07/04/16 0616  WBC 15.2*  HGB 10.3*  HCT 31.2*  PLT 233   ------------------------------------------------------------------------------------------------------------------  Chemistries   Recent Labs Lab 07/04/16 0616  NA 141  K 4.0  CL 100*  CO2 36*  GLUCOSE 211*  BUN 34*  CREATININE 0.84  CALCIUM 8.5*  AST 173*  ALT 1413*  ALKPHOS 78  BILITOT 0.9   ------------------------------------------------------------------------------------------------------------------  Cardiac Enzymes No results for input(s): TROPONINI in the last 168 hours. ------------------------------------------------------------------------------------------------------------------  RADIOLOGY:  X-ray Chest Pa And Lateral  07/02/2016  CLINICAL DATA:  Short of breath EXAM: CHEST  2 VIEW COMPARISON:  06/05/2016 FINDINGS: Advanced cardiomegaly. Pulmonary vasculature is indistinct. Bilateral central and basilar ill-defined pulmonary opacities have developed. No pneumothorax or pleural effusion. IMPRESSION: Interval development of bilateral ill-defined pulmonary opacities which are nonspecific. Findings may represent an inflammatory process. Followup PA and lateral chest X-ray is recommended in 3-4 weeks following trial of antibiotic therapy to ensure resolution and exclude underlying malignancy. Atypical pulmonary edema and malignancy are secondary considerations. Electronically Signed   By: Marybelle Killings M.D.   On: 07/02/2016 16:18   US Abdomen Complete  07/03/2016  CLINICAL DATA:  Elevated liver function tests. EXAM: ABDOMEN ULTRASOUND COMPLETE COMPARISON:  None. FINDINGS: Gallbladder: Multiple  gallstones are seen measuring up to 1.6 cm. No evidence of gallbladder dilatation or wall thickening. No sonographic Percell Miller  sign noted by sonographer. Common bile duct: Diameter: 5 mm, within normal limits. Liver: No focal lesion identified. Within normal limits in parenchymal echogenicity. Portal vein is patent. IVC: No abnormality visualized. Pancreas: Visualized portion unremarkable. Spleen: Size and appearance within normal limits. Right Kidney: Length: 10.8 cm. Echogenicity within normal limits. Small cyst noted in upper pole of right kidney measuring 1.5 cm. No mass or hydronephrosis visualized. Left Kidney: Length: 10.9 cm. Echogenicity within normal limits. No mass or hydronephrosis visualized. Abdominal aorta: No aneurysm visualized. Other findings: None. IMPRESSION: Cholelithiasis. No sonographic signs of acute cholecystitis or biliary dilatation. Unremarkable sonographic appearance of hepatic parenchyma. No liver mass identified. Electronically Signed   By: Earle Gell M.D.   On: 07/03/2016 10:57   US Venous Img Lower Unilateral Right  07/03/2016  CLINICAL DATA:  70 year old female with a history of swelling and leg pain EXAM: RIGHT LOWER EXTREMITY VENOUS DOPPLER ULTRASOUND TECHNIQUE: Gray-scale sonography with graded compression, as well as color Doppler and duplex ultrasound were performed to evaluate the lower extremity deep venous systems from the level of the common femoral vein and including the common femoral, femoral, profunda femoral, popliteal and calf veins including the posterior tibial, peroneal and gastrocnemius veins when visible. The superficial great saphenous vein was also interrogated. Spectral Doppler was utilized to evaluate flow at rest and with distal augmentation maneuvers in the common femoral, femoral and popliteal veins. COMPARISON:  None. FINDINGS: Contralateral Common Femoral Vein: Respiratory phasicity is normal and symmetric with the symptomatic side. No evidence of  thrombus. Normal compressibility. Common Femoral Vein: No evidence of thrombus. Normal compressibility, respiratory phasicity and response to augmentation. Saphenofemoral Junction: No evidence of thrombus. Normal compressibility and flow on color Doppler imaging. Profunda Femoral Vein: No evidence of thrombus. Normal compressibility and flow on color Doppler imaging. Femoral Vein: No evidence of thrombus. Normal compressibility, respiratory phasicity and response to augmentation. Popliteal Vein: No evidence of thrombus. Normal compressibility, respiratory phasicity and response to augmentation. Calf Veins: No evidence of thrombus. Normal compressibility and flow on color Doppler imaging. Superficial Great Saphenous Vein: No evidence of thrombus. Normal compressibility and flow on color Doppler imaging. Other Findings: Lentiform anechoic collection in the popliteal fossa on the right measures 2.7 cm x 1.3 cm x 2.1 cm peer no internal flow. IMPRESSION: Sonographic survey of the right lower extremity negative for DVT. Likely Baker cyst in the right popliteal fossa. Signed, Dulcy Fanny. Earleen Newport, DO [facility] Electronically Signed   By: Corrie Mckusick D.O.   On: 07/03/2016 10:48     ASSESSMENT AND PLAN:   70 year old female with past medical history of COPD, chronic respiratory failure, history of pseudomonal pneumonia, CHF, chronic atrial fibrillation, chronic kidney disease who presented to the hospital due to shortness of breath and noted to be in acute on chronic respiratory failure.  1. Acute on chronic respiratory failure with hypoxia-secondary to COPD exacerbation and pneumonia. -Continue IV steroids but will taper, continue IV Zosyn. Follow clinically. -Clinically afebrile and hemodynamically stable.  2. COPD with exacerbation-secondary to pneumonia seen on the chest x-ray. -Continue IV steroids, scheduled DuoNeb's, continue Zosyn  3.  Pneumonia-likely the cause of patient's COPD exacerbation. -She has a previous history of pseudomonal pneumonia. Was on Levaquin as an outpatient. Continue IV Zosyn for now. Follow blood, sputum cultures.  4. Abnormal LFTs-etiology unclear presently. Questionable related to cause passive congestion from CHF. -Abdominal ultrasound showing no acute pathology. Hepatitis profile negative. -LFTs trending down, diuresis with IV  Lasix. Await gastroenterology input.  5. Diabetes type 2 without complication-continue sliding scale insulin.  6. History of chronic afibrillation-rate controlled. Continue Cardizem, digoxin, metoprolol.    All the records are reviewed and case discussed with Care Management/Social Workerr. Management plans discussed with the patient, family and they are in agreement.  CODE STATUS: Full  DVT Prophylaxis: Lovenox  TOTAL TIME TAKING CARE OF THIS PATIENT: 30 minutes.   POSSIBLE D/C IN 2-3 DAYS, DEPENDING ON CLINICAL CONDITION.   Henreitta Leber M.D on 07/04/2016 at 2:23 PM  Between 7am to 6pm - Pager - [phone]  After 6pm go to [email or web address] - password [email or web address] [facility]  Office  [phone]  CC: Primary care physician; Glendon Axe, MD

## 2016-07-04 NOTE — Consult Note (Signed)
GI Inpatient Consult Note  Reason for Consult:   Attending Requesting Consult:  History of Present Illness: Elaine Decker is a 70 y.o. female seen for evaluation of abnormal LFTS at the request of Dr. Posey Pronto .   She reports worsening SOB over the past few weeks, drastically worse 5 days ago which prompted admission. Wears chronic O2 via [place] at home. She denies any GI symptoms - no nausea, vomiting, epigastric pain. She usually has a BM daily or every 2 days. Denies any constipation, diarrhea, rectal bleeding, lower abdominal pain. Treated for C. Diff in 03/2016.   She denies any prior history of abnormal LFTs. She reports was taking 5-6 Tylenol per day for about 3-5 days after a fall on July 4th, using in smaller doses until day before admission. She denies ever taking more than the 6 reccommended in 24 hr per daily on the bottle. She denies any high risk behaviors - no IV/intranasal drug use, no alcohol use. No needle sticks or blood transfusions. On multiple courses of ABX, believes last was in April or May of 2017. Only recent medication change she reports is metoprolol. No family history of liver disease.    Last Colonoscopy: None prior  Last Endoscopy: None prior    Past Medical History:  Past Medical History  Diagnosis Date  . COPD (chronic obstructive pulmonary disease) ([place])   . Hypertension   . Asthma   . CHF (congestive heart failure) ([place])   . A-fib ([place])   . CKD (chronic kidney disease)   . HLD (hyperlipidemia)     Problem List: Patient Active Problem List   Diagnosis Date Noted  . Acute on chronic respiratory failure with hypoxemia ([place]) 07/02/2016  . Chronic systolic heart failure ([place]) 04/04/2016  . COPD (chronic obstructive pulmonary disease) with chronic bronchitis ([place]) 04/04/2016  . Obstructive sleep apnea 04/04/2016  . Bilateral pneumonia 01/24/2016  . Status post thoracentesis 01/24/2016  . Prediabetes 01/24/2016  . Swelling of lower extremity 01/24/2016  .  Generalized weakness 01/24/2016  . Paroxysmal atrial fibrillation ([place]) 01/19/2016  . Essential hypertension 01/19/2016  . Recurrent right pleural effusion 01/19/2016  . HLD (hyperlipidemia) 01/19/2016  . Rapid atrial fibrillation ([place]) 11/28/2015    Past Surgical History: Past Surgical History  Procedure Laterality Date  . Surgery on the neck    . Flexible bronchoscopy N/A 02/01/2016    Procedure: FLEXIBLE BRONCHOSCOPY;  Surgeon: Allyne Gee, MD;  Location: [facility] ORS;  Service: Pulmonary;  Laterality: N/A;    Allergies: No Known Allergies  Home Medications: Prescriptions prior to admission  Medication Sig Dispense Refill Last Dose  . acetaminophen (TYLENOL) 325 MG tablet Take 325-650 mg by mouth at bedtime.   Taking  . albuterol (PROVENTIL HFA;VENTOLIN HFA) 108 (90 Base) MCG/ACT inhaler Inhale 2 puffs into the lungs every 4 (four) hours as needed for wheezing or shortness of breath.   Taking  . atorvastatin (LIPITOR) 20 MG tablet Take 20 mg by mouth daily.   Taking  . cholecalciferol (VITAMIN D) 1000 UNITS tablet Take 1,000 Units by mouth daily.   Taking  . dabigatran (PRADAXA) 150 MG CAPS capsule Take 150 mg by mouth 2 (two) times daily.   Taking  . digoxin (LANOXIN) 0.25 MG tablet Take 1 tablet (0.25 mg total) by mouth daily. 30 tablet 5   . diltiazem (DILACOR XR) 240 MG 24 hr capsule Take 240 mg by mouth daily.   Taking  . Fluticasone-Salmeterol (ADVAIR) 250-50 MCG/DOSE AEPB Inhale 1 puff  into the lungs 2 (two) times daily.   Taking  . furosemide (LASIX) 40 MG tablet Take 1 tablet (40 mg total) by mouth daily. 30 tablet 2 Taking  . ipratropium-albuterol (DUONEB) 0.5-2.5 (3) MG/3ML SOLN Take 3 mLs by nebulization every 6 (six) hours. 360 mL 1 Taking  . levofloxacin (LEVAQUIN) 500 MG tablet Take 500 mg by mouth daily.   Taking  . lisinopril (PRINIVIL,ZESTRIL) 5 MG tablet Take 1 tablet (5 mg total) by mouth daily. 30 tablet 5   . metoprolol (LOPRESSOR) 100 MG tablet Take 1 tablet (100  mg total) by mouth 2 (two) times daily. 60 tablet 2 Taking  . montelukast (SINGULAIR) 10 MG tablet Take 10 mg by mouth at bedtime.   5 Taking  . Multiple Vitamin (MULTIVITAMIN) tablet Take 1 tablet by mouth daily.   Taking  . PrednisoLONE 5 MG (21) TBPK Take by mouth as directed.   Taking  . tiotropium (SPIRIVA) 18 MCG inhalation capsule Place 18 mcg into inhaler and inhale daily.   Taking   Home medication reconciliation was completed with the patient.   Scheduled Inpatient Medications:   . antiseptic oral rinse  7 mL Mouth Rinse BID  . cholecalciferol  1,000 Units Oral Daily  . digoxin  0.25 mg Oral Daily  . diltiazem  240 mg Oral Daily  . docusate sodium  100 mg Oral BID  . furosemide  20 mg Intravenous BID  . heparin  5,000 Units Subcutaneous Q8H  . insulin aspart  0-9 Units Subcutaneous TID WC  . ipratropium-albuterol  3 mL Nebulization Q6H  . lisinopril  5 mg Oral Daily  . methylPREDNISolone (SOLU-MEDROL) injection  60 mg Intravenous Q12H  . metoprolol  100 mg Oral BID  . mometasone-formoterol  2 puff Inhalation BID  . montelukast  10 mg Oral QHS  . multivitamin with minerals  1 tablet Oral Daily  . piperacillin-tazobactam (ZOSYN)  IV  3.375 g Intravenous Q8H  . sodium chloride flush  3 mL Intravenous Q12H  . sodium chloride flush  3 mL Intravenous Q12H  . tiotropium  18 mcg Inhalation Daily    Continuous Inpatient Infusions:     PRN Inpatient Medications:  sodium chloride, acetaminophen **OR** [DISCONTINUED] acetaminophen, albuterol, ondansetron **OR** ondansetron (ZOFRAN) IV, polyethylene glycol, sodium chloride flush  Family History: family history includes Arrhythmia in her brother; CAD in her mother; Lung cancer in her father.  Denies liver issues or colon polyps/CRC in first degree relatives.    Social History:   reports that she quit smoking about 7 months ago. She has never used smokeless tobacco. She reports that she does not drink alcohol or use illicit  drugs.   Review of Systems: Constitutional: + weight loss   Eyes: No changes in vision. ENT: No oral lesions, sore throat.  GI: see HPI.  Heme/Lymph: No easy bruising.  CV: No chest pain.  GU: No hematuria.  Integumentary: No rashes.  Neuro: No headaches.  Psych: No depression/anxiety.  Endocrine: No heat/cold intolerance.  Allergic/Immunologic: No urticaria.  Resp: + cough, SOB.  Musculoskeletal: No joint swelling.    Physical Examination: BP 144/64 mmHg  Pulse 53  Temp(Src) 96.9 F (36.1 C) (Axillary)  Resp 17  Ht 5\' 3"  (1.6 m)  Wt 70.534 kg (155 lb 8 oz)  BMI 27.55 kg/m2  SpO2 94% Gen: NAD, alert and oriented x 4 HEENT: PEERLA, EOMI, Neck: supple, no JVD or thyromegaly Chest: CTA bilaterally, no wheezes, crackles, or other adventitious sounds CV: RRR,  no m/g/c/r Abd: soft, NT, ND, +BS in all four quadrants; no HSM, guarding, ridigity, or rebound tenderness Ext: no edema, well perfused with 2+ pulses, Skin: no rash or lesions noted Lymph: no LAD  Data: Lab Results  Component Value Date   WBC 15.2* 07/04/2016   HGB 10.3* 07/04/2016   HCT 31.2* 07/04/2016   MCV 90.1 07/04/2016   PLT 233 07/04/2016    Recent Labs Lab 07/02/16 1623 07/03/16 0508 07/04/16 0616  HGB 10.8* 10.8* 10.3*   Lab Results  Component Value Date   NA 141 07/04/2016   K 4.0 07/04/2016   CL 100* 07/04/2016   CO2 36* 07/04/2016   BUN 34* 07/04/2016   CREATININE 0.84 07/04/2016   Lab Results  Component Value Date   ALT 1413* 07/04/2016   AST 173* 07/04/2016   ALKPHOS 78 07/04/2016   BILITOT 0.9 07/04/2016   No results for input(s): APTT, INR, PTT in the last 168 hours.   Gallbladder: Multiple gallstones are seen measuring up to 1.6 cm. No evidence of gallbladder dilatation or wall thickening. No sonographic Murphy sign noted by sonographer.  Common bile duct: Diameter: 5 mm, within normal limits.Gallbladder: Multiple gallstones are seen measuring up to 1.6 cm. No evidence of  gallbladder dilatation or wall thickening. No sonographic Murphy sign noted by sonographer.  Common bile duct: Diameter: 5 mm, within normal limits.   Assessment/Plan: Ms. Shostak is a 70 y.o. female   1. Abnormal LFTs - acute, baseline LFTS normal 03/2016, trending down since admission (ALT >2000 on admission). Negative viral hepatitis panel. Recommend checking INR. Does have cholelithiasis but normal CBD & T bili. Heavy tylenol use, tylenol level on day 2 of admission normal, will add on Tylenol level to admission blood. BNP drastically elevated > 1800 with evidence of tricuspid regurgitation on last ECHO, most likely ischemic vs. Congestive hepatopathy.    Recommendations:  1. Add INR to daily LFTS 2. Check tylenol level at admission  Case discussed w/ Dr. Gustavo Lah.    Thank you for the consult. Please call with questions or concerns.  Ronney Asters, PA-C [place]

## 2016-07-04 NOTE — Consult Note (Signed)
Please see full GI consult by Ms Constance Haw. Patient admitted 7/17 with acute on chronic respiratory failure due to PNA.  Patient with pleural effusions.  Found to have elevated lfts.  These have been slowly improving.  Agree with likely congestive hepatopathy in the setting of  Known h/o CHF, EF 25-30%, moderate tricuspid regurgitation.  Patient also has a h/o remote etoh abuse, none for 10-12 years, and recent increase of acetaminophen use.   With current improvement, would continue conservative followup with daily PT and LFTs.  Will check acetaminophen level on admission blood. Of note baseline lfts are normal as reviewed over the past year. Following.

## 2016-07-04 NOTE — Clinical Documentation Improvement (Signed)
Hospitalist Please update your documentation within the medical record to reflect your response to this query. Thank you  Can the diagnosis of CKD be further specified?   CKD Stage I - GFR greater than or equal to 90  CKD Stage II - GFR 60-89  CKD Stage III - GFR 30-59  CKD Stage IV - GFR 15-29  CKD Stage V - GFR < 15  ESRD (End Stage Renal Disease)  Other condition  Unable to clinically determine  Supporting Information:  07/03/16 H&P..."CKD (chronic kidney disease)"... Results for SALLYE, LUNZ (MRN [id number]) as of 07/04/2016 11:46  07/02/2016 16:23 07/03/2016 05:08 07/04/2016 06:16  EGFR (Non-African Amer.) 50 (L) >60 >60   Please exercise your independent, professional judgment when responding. A specific answer is not anticipated or expected.  Thank You, Ermelinda Das, RN, BSN, [place] Certified Clinical Documentation Specialist [place]: Health Information Management [phone]

## 2016-07-04 NOTE — Progress Notes (Addendum)
Inpatient Diabetes Program Recommendations  AACE/ADA: New Consensus Statement on Inpatient Glycemic Control (2015)  Target Ranges:  Prepandial:   less than 140 mg/dL      Peak postprandial:   less than 180 mg/dL (1-2 hours)      Critically ill patients:  140 - 180 mg/dL   Lab Results  Component Value Date   GLUCAP 189* 07/04/2016   HGBA1C 6.3* 01/20/2016    Review of Glycemic ControlResults for CHAISTY, SAGERS (MRN [id number]) as of 07/04/2016 12:42  Ref. Range 07/03/2016 07:38 07/03/2016 19:01 07/03/2016 21:44 07/04/2016 07:54 07/04/2016 11:35  Glucose-Capillary Latest Ref Range: 65-99 mg/dL 194 (H) 245 (H) 181 (H) 185 (H) 189 (H)   Inpatient Diabetes Program Recommendations:   Note that blood sugars increased with steroids.  May consider adding Levemir 12 units daily while in the hospital.  Also may consider increasing Novolog correction to moderate tid with meals.  As steroids are tapered, insulin will also need to be decreased.    Thanks, Adah Perl, RN, BC-ADM Inpatient Diabetes Coordinator Pager [phone] (8a-5p)

## 2016-07-05 LAB — COMPREHENSIVE METABOLIC PANEL
ALBUMIN: 2.7 g/dL — AB (ref 3.5–5.0)
ALK PHOS: 83 U/L (ref 38–126)
ALT: 956 U/L — ABNORMAL HIGH (ref 14–54)
ANION GAP: 5 (ref 5–15)
AST: 84 U/L — AB (ref 15–41)
BILIRUBIN TOTAL: 0.7 mg/dL (ref 0.3–1.2)
BUN: 31 mg/dL — ABNORMAL HIGH (ref 6–20)
CALCIUM: 8.5 mg/dL — AB (ref 8.9–10.3)
CO2: 41 mmol/L — AB (ref 22–32)
Chloride: 95 mmol/L — ABNORMAL LOW (ref 101–111)
Creatinine, Ser: 0.66 mg/dL (ref 0.44–1.00)
GFR calc Af Amer: >60 mL/min (ref >60)
GFR calc non Af Amer: >60 mL/min (ref >60)
Glucose, Bld: 134 mg/dL — ABNORMAL HIGH (ref 65–99)
POTASSIUM: 3.6 mmol/L (ref 3.5–5.1)
SODIUM: 141 mmol/L (ref 135–145)
TOTAL PROTEIN: 5.8 g/dL — AB (ref 6.5–8.1)

## 2016-07-05 LAB — GLUCOSE, CAPILLARY
GLUCOSE-CAPILLARY: 238 mg/dL — AB (ref 65–99)
GLUCOSE-CAPILLARY: 220 mg/dL — AB (ref 65–99)
Glucose-Capillary: 123 mg/dL — ABNORMAL HIGH (ref 65–99)
Glucose-Capillary: 140 mg/dL — ABNORMAL HIGH (ref 65–99)

## 2016-07-05 LAB — PROTIME-INR
INR: 1.29
Prothrombin Time: 16.2 seconds — ABNORMAL HIGH (ref 11.4–15.0)

## 2016-07-05 MED ORDER — DABIGATRAN ETEXILATE MESYLATE 150 MG PO CAPS
150.0000 mg | ORAL_CAPSULE | Freq: Two times a day (BID) | ORAL | Status: DC
  Administered 2016-07-05 – 2016-07-06 (×2): 150 mg via ORAL
  Filled 2016-07-05 (×3): qty 1

## 2016-07-05 NOTE — Consult Note (Signed)
Subjective: Patient seen for abnormal lfts.  Patinet doing well, less shortness of breath, no n/v or abdominal pain.   One bm per patient, formed.   Objective: Vital signs in last 24 hours: Temp:  [97.7 F (36.5 C)-98.3 F (36.8 C)] 97.7 F (36.5 C) (07/20 0529) Pulse Rate:  [68-72] 72 (07/20 0924) Resp:  [17-18] 18 (07/20 0529) BP: (114-123)/(57-76) 123/60 mmHg (07/20 0924) SpO2:  [92 %-95 %] 92 % (07/20 0529) Blood pressure 123/60, pulse 72, temperature 97.7 F (36.5 C), temperature source Axillary, resp. rate 18, height 5\' 3"  (1.6 m), weight 70.534 kg (155 lb 8 oz), SpO2 92 %.   Intake/Output from previous day: 07/19 0701 - 07/20 0700 In: 1180 [P.O.:1080; IV Piggyback:100] Out: 2550 [Urine:2550]  Intake/Output this shift: Total I/O In: 620 [P.O.:440; IV Piggyback:180] Out: 0    General appearance:  69 f no distress Resp:  Cta, percussive dullness bilateral bases, l>r Cardio:  rrr GI:  Soft, nontender nondistended bs positive normoactive.  Extremities:     Lab Results: Results for orders placed or performed during the hospital encounter of 07/02/16 (from the past 24 hour(s))  Glucose, capillary     Status: Abnormal   Collection Time: 07/04/16  4:26 PM  Result Value Ref Range   Glucose-Capillary 154 (H) 65 - 99 mg/dL  Glucose, capillary     Status: Abnormal   Collection Time: 07/04/16  9:59 PM  Result Value Ref Range   Glucose-Capillary 185 (H) 65 - 99 mg/dL  Protime-INR     Status: Abnormal   Collection Time: 07/05/16  4:29 AM  Result Value Ref Range   Prothrombin Time 16.2 (H) 11.4 - 15.0 seconds   INR 1.29   Comprehensive metabolic panel     Status: Abnormal   Collection Time: 07/05/16  4:29 AM  Result Value Ref Range   Sodium 141 135 - 145 mmol/L   Potassium 3.6 3.5 - 5.1 mmol/L   Chloride 95 (L) 101 - 111 mmol/L   CO2 41 (H) 22 - 32 mmol/L   Glucose, Bld 134 (H) 65 - 99 mg/dL   BUN 31 (H) 6 - 20 mg/dL   Creatinine, Ser 0.66 0.44 - 1.00 mg/dL   Calcium  8.5 (L) 8.9 - 10.3 mg/dL   Total Protein 5.8 (L) 6.5 - 8.1 g/dL   Albumin 2.7 (L) 3.5 - 5.0 g/dL   AST 84 (H) 15 - 41 U/L   ALT 956 (H) 14 - 54 U/L   Alkaline Phosphatase 83 38 - 126 U/L   Total Bilirubin 0.7 0.3 - 1.2 mg/dL   GFR calc non Af Amer >60 >60 mL/min   GFR calc Af Amer >60 >60 mL/min   Anion gap 5 5 - 15  Glucose, capillary     Status: Abnormal   Collection Time: 07/05/16  7:34 AM  Result Value Ref Range   Glucose-Capillary 123 (H) 65 - 99 mg/dL  Glucose, capillary     Status: Abnormal   Collection Time: 07/05/16 11:28 AM  Result Value Ref Range   Glucose-Capillary 140 (H) 65 - 99 mg/dL      Recent Labs  07/02/16 1623 07/03/16 0508 07/04/16 0616  WBC 16.2* 13.9* 15.2*  HGB 10.8* 10.8* 10.3*  HCT 33.9* 32.5* 31.2*  PLT 189 204 233   BMET  Recent Labs  07/03/16 0508 07/04/16 0616 07/05/16 0429  NA 143 141 141  K 4.7 4.0 3.6  CL 105 100* 95*  CO2 34* 36* 41*  GLUCOSE  175* 211* 134*  BUN 38* 34* 31*  CREATININE 0.81 0.84 0.66  CALCIUM 8.3* 8.5* 8.5*   LFT  Recent Labs  07/05/16 0429  PROT 5.8*  ALBUMIN 2.7*  AST 84*  ALT 956*  ALKPHOS 83  BILITOT 0.7   PT/INR  Recent Labs  07/05/16 0429  LABPROT 16.2*  INR 1.29   Hepatitis Panel  Recent Labs  07/03/16 0511  HEPBSAG Negative  HCVAB 0.1  HEPAIGM Negative  HEPBIGM Negative   C-Diff No results for input(s): CDIFFTOX in the last 72 hours. No results for input(s): CDIFFPCR in the last 72 hours.   Studies/Results: No results found.  Scheduled Inpatient Medications:   . antiseptic oral rinse  7 mL Mouth Rinse BID  . cholecalciferol  1,000 Units Oral Daily  . digoxin  0.25 mg Oral Daily  . diltiazem  240 mg Oral Daily  . enoxaparin (LOVENOX) injection  40 mg Subcutaneous Q24H  . furosemide  20 mg Intravenous BID  . insulin aspart  0-9 Units Subcutaneous TID WC  . insulin detemir  12 Units Subcutaneous QHS  . ipratropium-albuterol  3 mL Nebulization Q6H  . lisinopril  5 mg  Oral Daily  . methylPREDNISolone (SOLU-MEDROL) injection  60 mg Intravenous Daily  . metoprolol  100 mg Oral BID  . mometasone-formoterol  2 puff Inhalation BID  . montelukast  10 mg Oral QHS  . multivitamin with minerals  1 tablet Oral Daily  . piperacillin-tazobactam (ZOSYN)  IV  3.375 g Intravenous Q8H  . senna-docusate  1 tablet Oral BID  . sodium chloride flush  3 mL Intravenous Q12H    Continuous Inpatient Infusions:     PRN Inpatient Medications:  sodium chloride, acetaminophen **OR** [DISCONTINUED] acetaminophen, albuterol, ondansetron **OR** ondansetron (ZOFRAN) IV, polyethylene glycol  Miscellaneous:   Assessment:  1) abnormal lfts- likely congestive hepatopathy, with low cardiac ef and triscuspid regurg CHF exacerbation.   Transaminases improving.    Plan:  1) continue daily lft and pt. No other GI recs.   Lollie Sails MD 07/05/2016, 12:16 PM

## 2016-07-05 NOTE — Progress Notes (Signed)
Pharmacy Antibiotic Note  Elaine Decker is a 70 y.o. female admitted on 07/02/2016 with CAP.  Pharmacy has been consulted for Zosyn dosing. Patient with a history of pseudomonas pneumonia in February 2017.  Therefore, MD desires pseudomonal coverage.   Plan: ContinueZosyn 3.375g IV q8h (4 hour infusion).  Height: 5\' 3"  (160 cm) Weight: 155 lb 8 oz (70.534 kg) IBW/kg (Calculated) : 52.4  Temp (24hrs), Avg:98 F (36.7 C), Min:97.7 F (36.5 C), Max:98.3 F (36.8 C)   Recent Labs Lab 07/02/16 1623 07/03/16 0508 07/04/16 0616 07/05/16 0429  WBC 16.2* 13.9* 15.2*  --   CREATININE 1.12* 0.81 0.84 0.66    Estimated Creatinine Clearance: 64.2 mL/min (by C-G formula based on Cr of 0.66).    No Known Allergies  Antimicrobials this admission: 7/17 Zosyn >>    Microbiology results: None at this time  Thank you for allowing pharmacy to be a part of this patient's care.  Ulice Dash D 07/05/2016 11:02 AM

## 2016-07-05 NOTE — Progress Notes (Signed)
[facility] at [place] NAME: Elaine Decker    MR#:  [id number]  DATE OF BIRTH:  05/12/1946  SUBJECTIVE:   Pt. Here due to shortness of breath from COPD Exacerbation And possible pneumonia. LFTs improving.  Feels better.    REVIEW OF SYSTEMS:    Review of Systems  Constitutional: Negative for fever and chills.  HENT: Negative for congestion and tinnitus.   Eyes: Negative for blurred vision and double vision.  Respiratory: Positive for cough, sputum production and shortness of breath. Negative for wheezing.   Cardiovascular: Negative for chest pain, orthopnea and PND.  Gastrointestinal: Negative for nausea, vomiting, abdominal pain and diarrhea.  Genitourinary: Negative for dysuria and hematuria.  Neurological: Negative for dizziness, sensory change and focal weakness.  All other systems reviewed and are negative.   Nutrition: Heart Healthy/Carb Modified Tolerating Diet: yes Tolerating PT: Ambulatory  DRUG ALLERGIES:  No Known Allergies  VITALS:  Blood pressure 124/57, pulse 77, temperature 97.9 F (36.6 C), temperature source Axillary, resp. rate 19, height 5\' 3"  (1.6 m), weight 70.534 kg (155 lb 8 oz), SpO2 93 %.  PHYSICAL EXAMINATION:   Physical Exam  GENERAL:  70 y.o.-year-old patient lying in the bed in no acute distress.  EYES: Pupils equal, round, reactive to light and accommodation. No scleral icterus. Extraocular muscles intact.  HEENT: Head atraumatic, normocephalic. Oropharynx and nasopharynx clear.  NECK:  Supple, no jugular venous distention. No thyroid enlargement, no tenderness.  LUNGS: Prolonged Insp & exp. phase, no wheezing, rales, rhonchi. No use of accessory muscles of respiration.  CARDIOVASCULAR: S1, S2 normal. 2/6 systolic murmur at RSB, No rubs, or gallops.  ABDOMEN: Soft, nontender, nondistended. Bowel sounds present. No organomegaly or mass.  EXTREMITIES: No cyanosis, clubbing or edema b/l.    NEUROLOGIC:  Cranial nerves II through XII are intact. No focal Motor or sensory deficits b/l.   PSYCHIATRIC: The patient is alert and oriented x 3.  SKIN: No obvious rash, lesion, or ulcer.    LABORATORY PANEL:   CBC  Recent Labs Lab 07/04/16 0616  WBC 15.2*  HGB 10.3*  HCT 31.2*  PLT 233   ------------------------------------------------------------------------------------------------------------------  Chemistries   Recent Labs Lab 07/05/16 0429  NA 141  K 3.6  CL 95*  CO2 41*  GLUCOSE 134*  BUN 31*  CREATININE 0.66  CALCIUM 8.5*  AST 84*  ALT 956*  ALKPHOS 83  BILITOT 0.7   ------------------------------------------------------------------------------------------------------------------  Cardiac Enzymes No results for input(s): TROPONINI in the last 168 hours. ------------------------------------------------------------------------------------------------------------------  RADIOLOGY:  No results found.   ASSESSMENT AND PLAN:   70 year old female with past medical history of COPD, chronic respiratory failure, history of pseudomonal pneumonia, CHF, chronic atrial fibrillation, chronic kidney disease who presented to the hospital due to shortness of breath and noted to be in acute on chronic respiratory failure.  1. Acute on chronic respiratory failure with hypoxia-secondary to COPD exacerbation and pneumonia. -Continue IV steroids and will switch to Oral Pred. tomorrow, continue IV Zosyn. Follow clinically. -Clinically afebrile and hemodynamically stable.  2. COPD with exacerbation-secondary to pneumonia seen on the chest x-ray. -Continue IV steroids, scheduled DuoNeb's, continue Zosyn.   - will repeat CXR in a.m.   3. Pneumonia-likely the cause of patient's COPD exacerbation. -She has a previous history of pseudomonal pneumonia. Was on Levaquin as an outpatient. Continue IV Zosyn for now.  - clinically improving w/ IV abx. Will repeat CXR in a.m.   4. Abnormal  LFTs-etiology unclear  presently. Secondary to chronic passive congestion as per GI -Abdominal ultrasound showing no acute pathology. Hepatitis profile negative. -LFTs trending down, improving w/ diuresis with IV Lasix.  5. Diabetes type 2 without complication- BS stable.   - cont. Levemir, SSI.   6. History of chronic afibrillation-rate controlled. Continue Cardizem, digoxin, metoprolol.    All the records are reviewed and case discussed with Care Management/Social Workerr. Management plans discussed with the patient, family and they are in agreement.  CODE STATUS: Full  DVT Prophylaxis: Lovenox  TOTAL TIME TAKING CARE OF THIS PATIENT: 25 minutes.   POSSIBLE D/C IN 2-3 DAYS, DEPENDING ON CLINICAL CONDITION.   Henreitta Leber M.D on 07/05/2016 at 12:59 PM  Between 7am to 6pm - Pager - [phone]  After 6pm go to [email or web address] - password [email or web address] [facility]  Office  [phone]  CC: Primary care physician; Glendon Axe, MD

## 2016-07-06 ENCOUNTER — Inpatient Hospital Stay: Payer: Medicare Other

## 2016-07-06 LAB — CBC
HCT: 34 % — ABNORMAL LOW (ref 35.0–47.0)
Hemoglobin: 11.2 g/dL — ABNORMAL LOW (ref 12.0–16.0)
MCH: 29.8 pg (ref 26.0–34.0)
MCHC: 33.1 g/dL (ref 32.0–36.0)
MCV: 90.1 fL (ref 80.0–100.0)
PLATELETS: 239 10*3/uL (ref 150–440)
RBC: 3.77 MIL/uL — AB (ref 3.80–5.20)
RDW: 16 % — AB (ref 11.5–14.5)
WBC: 13.3 10*3/uL — AB (ref 3.6–11.0)

## 2016-07-06 LAB — COMPREHENSIVE METABOLIC PANEL
ALK PHOS: 76 U/L (ref 38–126)
ALT: 761 U/L — AB (ref 14–54)
AST: 76 U/L — AB (ref 15–41)
Albumin: 2.9 g/dL — ABNORMAL LOW (ref 3.5–5.0)
Anion gap: 8 (ref 5–15)
BILIRUBIN TOTAL: 0.5 mg/dL (ref 0.3–1.2)
BUN: 28 mg/dL — AB (ref 6–20)
CALCIUM: 8.8 mg/dL — AB (ref 8.9–10.3)
CHLORIDE: 92 mmol/L — AB (ref 101–111)
CO2: 41 mmol/L — ABNORMAL HIGH (ref 22–32)
CREATININE: 0.74 mg/dL (ref 0.44–1.00)
Glucose, Bld: 141 mg/dL — ABNORMAL HIGH (ref 65–99)
Potassium: 3.3 mmol/L — ABNORMAL LOW (ref 3.5–5.1)
Sodium: 141 mmol/L (ref 135–145)
TOTAL PROTEIN: 5.9 g/dL — AB (ref 6.5–8.1)

## 2016-07-06 LAB — PROTIME-INR
INR: 1.83
Prothrombin Time: 21.1 seconds — ABNORMAL HIGH (ref 11.4–15.0)

## 2016-07-06 LAB — GLUCOSE, CAPILLARY
GLUCOSE-CAPILLARY: 158 mg/dL — AB (ref 65–99)
Glucose-Capillary: 140 mg/dL — ABNORMAL HIGH (ref 65–99)
Glucose-Capillary: 88 mg/dL (ref 65–99)

## 2016-07-06 MED ORDER — PREDNISONE 10 MG PO TABS: ORAL_TABLET | ORAL | Status: DC

## 2016-07-06 MED ORDER — LEVOFLOXACIN 750 MG PO TABS: 750.0000 mg | ORAL_TABLET | Freq: Every day | ORAL | Status: DC

## 2016-07-06 NOTE — Progress Notes (Signed)
Notified MD that pts IV infiltrated and was wondering if pt would be discharged today or if a new IV needed to be started. Per Dr. Verdell Carmine he will figure out if he wants to discharge when he rounds on the pt.

## 2016-07-06 NOTE — Discharge Summary (Signed)
[facility] at [place] NAME: Elaine Decker    MR#:  [id number]  DATE OF BIRTH:  04-17-1946  DATE OF ADMISSION:  07/02/2016 ADMITTING PHYSICIAN: Henreitta Leber, MD  DATE OF DISCHARGE: 07/06/2016  PRIMARY CARE PHYSICIAN: Singh,Jasmine, MD    ADMISSION DIAGNOSIS:  Acute Respiratory failure with hypoxia  DISCHARGE DIAGNOSIS:  Active Problems:   Acute on chronic respiratory failure with hypoxemia (HCC)   SECONDARY DIAGNOSIS:   Past Medical History  Diagnosis Date  . COPD (chronic obstructive pulmonary disease) ([place])   . Hypertension   . Asthma   . CHF (congestive heart failure) ([place])   . A-fib ([place])   . CKD (chronic kidney disease)   . HLD (hyperlipidemia)     HOSPITAL COURSE:   70 year old female with past medical history of COPD, chronic respiratory failure, history of pseudomonal pneumonia, CHF, chronic atrial fibrillation, chronic kidney disease who presented to the hospital due to shortness of breath and noted to be in acute on chronic respiratory failure.  1. Acute on chronic respiratory failure with hypoxia-this was secondary to COPD exacerbation and pneumonia. -Patient was admitted to the hospital started on IV steroids, IV Zosyn. -After a few days of aggressive therapy she has clinically improved. She is already on oxygen at home at 2 L nasal cannula chronically which she will resume. She has no wheezing or bronchospasm and a workup breathing is improved. She is being discharged on oral Prednisone taper, Levaquin upon discharge. -She will follow-up with her pulmonologist as an outpatient.  2. COPD with exacerbation-secondary to pneumonia seen on the chest x-ray. -On the hospital patient was treated with IV steroids, scheduled DuoNeb's and empiric Zosyn. -She has clinically improved. Her chest x-ray shows sows a right sided pneumonia but she will be discharged on oral antibiotics and should follow-up with her pulmonologist as an  outpatient.  3. Pneumonia-likely the cause of patient's COPD exacerbation. -She has a previous history of pseudomonal pneumonia. Patient was treated with IV Zosyn in the hospital for a few days and has clinically improved. She is now being discharged on oral Levaquin for the next 10 days and follow-up with her pulmonologist as outpatient.  4. Abnormal LFTs-most likely cause of this was chronic passive congestion from her CHF. Her LFTs have trended down. She was seen by gastroenterology and underwent a hepatitis profile which was negative and abdominal ultrasound which showed no acute pathology. -After IV diuresis with Lasix her LFTs have improved and can be further followed as an outpatient. She will follow up with gastroenterology as an outpatient next couple weeks.  5. Hyperglycemia - this was due to steroids and since they are being tapered they will cont. To improve  6. History of chronic afibrillation-rate controlled. Continue Cardizem, digoxin, metoprolol.  - she will cont. Her Pradaxa.  7. Hyperlipidemia - Pt. Will cont. her Atorvastatin.   DISCHARGE CONDITIONS:   Stable  CONSULTS OBTAINED:  Treatment Team:  Lollie Sails, MD  DRUG ALLERGIES:  No Known Allergies  DISCHARGE MEDICATIONS:   Current Discharge Medication List    START taking these medications   Details  levofloxacin (LEVAQUIN) 750 MG tablet Take 1 tablet (750 mg total) by mouth daily. Qty: 10 tablet, Refills: 0    predniSONE (DELTASONE) 10 MG tablet Label  & dispense according to the schedule below. 5 Pills PO for 2 days then, 4 Pills PO for 2 days, 3 Pills PO for 2 days, 2 Pills PO for 2  days, 1 Pill PO for 2 days then STOP. Qty: 30 tablet, Refills: 0      CONTINUE these medications which have NOT CHANGED   Details  acetaminophen (TYLENOL) 325 MG tablet Take 325-650 mg by mouth at bedtime.    acidophilus (RISAQUAD) CAPS capsule Take 1 capsule by mouth 2 (two) times daily.    albuterol (PROVENTIL  HFA;VENTOLIN HFA) 108 (90 Base) MCG/ACT inhaler Inhale 2 puffs into the lungs every 4 (four) hours as needed for wheezing or shortness of breath.    atorvastatin (LIPITOR) 20 MG tablet Take 20 mg by mouth daily.    cholecalciferol (VITAMIN D) 1000 UNITS tablet Take 1,000 Units by mouth daily.    dabigatran (PRADAXA) 150 MG CAPS capsule Take 150 mg by mouth 2 (two) times daily.    digoxin (LANOXIN) 0.25 MG tablet Take 1 tablet (0.25 mg total) by mouth daily. Qty: 30 tablet, Refills: 5    diltiazem (DILACOR XR) 240 MG 24 hr capsule Take 240 mg by mouth daily.    Fluticasone-Salmeterol (ADVAIR) 250-50 MCG/DOSE AEPB Inhale 1 puff into the lungs 2 (two) times daily.    furosemide (LASIX) 40 MG tablet Take 1 tablet (40 mg total) by mouth daily. Qty: 30 tablet, Refills: 2    ipratropium-albuterol (DUONEB) 0.5-2.5 (3) MG/3ML SOLN Take 3 mLs by nebulization every 6 (six) hours. Qty: 360 mL, Refills: 1    lisinopril (PRINIVIL,ZESTRIL) 5 MG tablet Take 1 tablet (5 mg total) by mouth daily. Qty: 30 tablet, Refills: 5    metoprolol (LOPRESSOR) 100 MG tablet Take 1 tablet (100 mg total) by mouth 2 (two) times daily. Qty: 60 tablet, Refills: 2    montelukast (SINGULAIR) 10 MG tablet Take 10 mg by mouth at bedtime.  Refills: 5    Multiple Vitamin (MULTIVITAMIN) tablet Take 1 tablet by mouth daily.    tiotropium (SPIRIVA) 18 MCG inhalation capsule Place 18 mcg into inhaler and inhale daily.         DISCHARGE INSTRUCTIONS:   DIET:  Cardiac diet  DISCHARGE CONDITION:  Stable  ACTIVITY:  Activity as tolerated  OXYGEN:  Home Oxygen: Yes.     Oxygen Delivery: 2 liters/min via Patient connected to nasal cannula oxygen  DISCHARGE LOCATION:  home   If you experience worsening of your admission symptoms, develop shortness of breath, life threatening emergency, suicidal or homicidal thoughts you must seek medical attention immediately by calling 911 or calling your MD immediately  if  symptoms less severe.  You Must read complete instructions/literature along with all the possible adverse reactions/side effects for all the Medicines you take and that have been prescribed to you. Take any new Medicines after you have completely understood and accpet all the possible adverse reactions/side effects.   Please note  You were cared for by a hospitalist during your hospital stay. If you have any questions about your discharge medications or the care you received while you were in the hospital after you are discharged, you can call the unit and asked to speak with the hospitalist on call if the hospitalist that took care of you is not available. Once you are discharged, your primary care physician will handle any further medical issues. Please note that NO REFILLS for any discharge medications will be authorized once you are discharged, as it is imperative that you return to your primary care physician (or establish a relationship with a primary care physician if you do not have one) for your aftercare needs so that they  can reassess your need for medications and monitor your lab values.     Today   Shortness of breath improved. Still has a cough but non-productive.  No abdominal pain, N/V.    VITAL SIGNS:  Blood pressure 125/54, pulse 59, temperature 97.7 F (36.5 C), temperature source Oral, resp. rate 18, height 5\' 3"  (1.6 m), weight 70.534 kg (155 lb 8 oz), SpO2 96 %.  I/O:   Intake/Output Summary (Last 24 hours) at 07/06/16 1503 Last data filed at 07/06/16 1214  Gross per 24 hour  Intake    285 ml  Output      0 ml  Net    285 ml    PHYSICAL EXAMINATION:   GENERAL: 70 y.o.-year-old patient lying in the bed in no acute distress.  EYES: Pupils equal, round, reactive to light and accommodation. No scleral icterus. Extraocular muscles intact.  HEENT: Head atraumatic, normocephalic. Oropharynx and nasopharynx clear.  NECK: Supple, no jugular venous distention. No  thyroid enlargement, no tenderness.  LUNGS: Prolonged Insp & exp. phase, no wheezing, rales, rhonchi. No use of accessory muscles of respiration.  CARDIOVASCULAR: S1, S2 normal. 2/6 systolic murmur at RSB, No rubs, or gallops.  ABDOMEN: Soft, nontender, nondistended. Bowel sounds present. No organomegaly or mass.  EXTREMITIES: No cyanosis, clubbing or edema b/l.  NEUROLOGIC: Cranial nerves II through XII are intact. No focal Motor or sensory deficits b/l.  PSYCHIATRIC: The patient is alert and oriented x 3.  SKIN: No obvious rash, lesion, or ulcer.   DATA REVIEW:   CBC  Recent Labs Lab 07/06/16 0431  WBC 13.3*  HGB 11.2*  HCT 34.0*  PLT 239    Chemistries   Recent Labs Lab 07/06/16 0431  NA 141  K 3.3*  CL 92*  CO2 41*  GLUCOSE 141*  BUN 28*  CREATININE 0.74  CALCIUM 8.8*  AST 76*  ALT 761*  ALKPHOS 76  BILITOT 0.5    Cardiac Enzymes No results for input(s): TROPONINI in the last 168 hours.   RADIOLOGY:  Dg Chest 1 View  07/06/2016  CLINICAL DATA:  Pneumonia. EXAM: CHEST 1 VIEW COMPARISON:  07/02/2016. FINDINGS: Mediastinum hilar structures normal. Cardiomegaly with pulmonary vascular prominence. Progressive right lower lobe infiltrate. Small bilateral pleural effusions. No pneumothorax. Old bilateral rib fractures. IMPRESSION: 1.  Cardiomegaly with pulmonary vascular prominence. 2. Progressive right lower lobe infiltrate consistent with pneumonia and/or asymmetric pulmonary edema. 3.  Small bilateral pleural effusions. Electronically Signed   By: Marcello Moores  Register   On: 07/06/2016 07:11      Management plans discussed with the patient, family and they are in agreement.  CODE STATUS:     Code Status Orders        Start     Ordered   07/02/16 1513  Full code   Continuous     07/02/16 1512    Code Status History    Date Active Date Inactive Code Status Order ID Comments User Context   03/19/2016  2:41 AM 03/26/2016  3:56 PM Full Code [id number]   Saundra Shelling, MD Inpatient   01/19/2016  9:13 PM 01/24/2016  7:21 PM Full Code [id number]  Lance Coon, MD Inpatient   11/28/2015 11:41 AM 11/30/2015  5:51 PM Full Code [id number]  Loletha Grayer, MD ED      TOTAL TIME TAKING CARE OF THIS PATIENT: 40 minutes.    Henreitta Leber M.D on 07/06/2016 at 3:03 PM  Between 7am to 6pm - Pager - [phone]  After 6pm  go to [email or web address] - password [email or web address] [facility]  Office  [phone]  CC: Primary care physician; Glendon Axe, MD

## 2016-07-06 NOTE — Progress Notes (Signed)
Pt A and O x 4. VSS. Pt tolerating diet well. No complaints of pain or nausea. IV removed intact, prescriptions given. Pt voiced understanding of discharge instructions with no further questions. Pt discharged via wheelchair with nurse aide. Pt had home O2 take on at discharge.

## 2016-07-06 NOTE — Progress Notes (Signed)
Pharmacy Antibiotic Note  Elaine Decker is a 70 y.o. female admitted on 07/02/2016 with CAP.  Pharmacy has been consulted for Zosyn dosing. Patient with a history of pseudomonas pneumonia in February 2017.  Therefore, MD desires pseudomonal coverage.   Plan: Continue Zosyn 3.375g IV q8h (4 hour infusion).  Height: 5\' 3"  (160 cm) Weight: 155 lb 8 oz (70.534 kg) IBW/kg (Calculated) : 52.4  Temp (24hrs), Avg:98.1 F (36.7 C), Min:97.7 F (36.5 C), Max:98.4 F (36.9 C)   Recent Labs Lab 07/02/16 1623 07/03/16 0508 07/04/16 0616 07/05/16 0429 07/06/16 0431  WBC 16.2* 13.9* 15.2*  --  13.3*  CREATININE 1.12* 0.81 0.84 0.66 0.74    Estimated Creatinine Clearance: 64.2 mL/min (by C-G formula based on Cr of 0.74).    No Known Allergies  Antimicrobials this admission: 7/17 Zosyn >>    Microbiology results: None at this time  Thank you for allowing pharmacy to be a part of this patient's care.  Ulice Dash D 07/06/2016 1:17 PM

## 2016-07-06 NOTE — Care Management Important Message (Signed)
Important Message  Patient Details  Name: Elaine Decker MRN: [id number] Date of Birth: 1946-05-06   Medicare Important Message Given:  Yes    Beverly Sessions, RN 07/06/2016, 10:49 AM

## 2016-07-10 ENCOUNTER — Other Ambulatory Visit: Payer: Self-pay | Admitting: Family

## 2016-07-10 MED ORDER — METOPROLOL TARTRATE 100 MG PO TABS: 100.0000 mg | ORAL_TABLET | Freq: Two times a day (BID) | ORAL | 5 refills | Status: DC

## 2016-07-11 ENCOUNTER — Ambulatory Visit
Admission: RE | Admit: 2016-07-11 | Discharge: 2016-07-11 | Disposition: A | Discharge status: Home / Self Care | Payer: BLUE CROSS/BLUE SHIELD | Source: Ambulatory Visit | Attending: Internal Medicine | Admitting: Internal Medicine

## 2016-07-11 ENCOUNTER — Other Ambulatory Visit: Payer: Self-pay | Admitting: Internal Medicine

## 2016-07-11 DIAGNOSIS — J151 Pneumonia due to Pseudomonas: Secondary | ICD-10-CM | POA: Diagnosis not present

## 2016-07-11 DIAGNOSIS — I517 Cardiomegaly: Secondary | ICD-10-CM | POA: Diagnosis not present

## 2016-07-11 DIAGNOSIS — J690 Pneumonitis due to inhalation of food and vomit: Secondary | ICD-10-CM

## 2016-07-24 ENCOUNTER — Other Ambulatory Visit: Payer: Self-pay | Admitting: Family

## 2016-08-06 ENCOUNTER — Encounter: Payer: Self-pay | Admitting: Family

## 2016-08-06 ENCOUNTER — Ambulatory Visit: Payer: BLUE CROSS/BLUE SHIELD | Attending: Family | Admitting: Family

## 2016-08-06 VITALS — BP 144/64 | HR 83 | Resp 20 | Ht 63.0 in | Wt 145.0 lb

## 2016-08-06 DIAGNOSIS — N189 Chronic kidney disease, unspecified: Secondary | ICD-10-CM | POA: Diagnosis not present

## 2016-08-06 DIAGNOSIS — E785 Hyperlipidemia, unspecified: Secondary | ICD-10-CM | POA: Insufficient documentation

## 2016-08-06 DIAGNOSIS — Z87891 Personal history of nicotine dependence: Secondary | ICD-10-CM | POA: Diagnosis not present

## 2016-08-06 DIAGNOSIS — R251 Tremor, unspecified: Secondary | ICD-10-CM | POA: Insufficient documentation

## 2016-08-06 DIAGNOSIS — I5022 Chronic systolic (congestive) heart failure: Secondary | ICD-10-CM

## 2016-08-06 DIAGNOSIS — I4891 Unspecified atrial fibrillation: Secondary | ICD-10-CM | POA: Diagnosis not present

## 2016-08-06 DIAGNOSIS — I509 Heart failure, unspecified: Secondary | ICD-10-CM | POA: Diagnosis not present

## 2016-08-06 DIAGNOSIS — I130 Hypertensive heart and chronic kidney disease with heart failure and stage 1 through stage 4 chronic kidney disease, or unspecified chronic kidney disease: Secondary | ICD-10-CM | POA: Diagnosis present

## 2016-08-06 DIAGNOSIS — J45909 Unspecified asthma, uncomplicated: Secondary | ICD-10-CM | POA: Insufficient documentation

## 2016-08-06 DIAGNOSIS — J449 Chronic obstructive pulmonary disease, unspecified: Secondary | ICD-10-CM | POA: Diagnosis not present

## 2016-08-06 DIAGNOSIS — Z791 Long term (current) use of non-steroidal anti-inflammatories (NSAID): Secondary | ICD-10-CM | POA: Diagnosis not present

## 2016-08-06 DIAGNOSIS — Z79899 Other long term (current) drug therapy: Secondary | ICD-10-CM | POA: Diagnosis not present

## 2016-08-06 DIAGNOSIS — Z9889 Other specified postprocedural states: Secondary | ICD-10-CM | POA: Diagnosis not present

## 2016-08-06 DIAGNOSIS — I480 Paroxysmal atrial fibrillation: Secondary | ICD-10-CM

## 2016-08-06 DIAGNOSIS — I10 Essential (primary) hypertension: Secondary | ICD-10-CM

## 2016-08-06 NOTE — Patient Instructions (Signed)
Continue weighing daily and call for an overnight weight gain of > 2 pounds or a weekly weight gain of >5 pounds. 

## 2016-08-06 NOTE — Progress Notes (Signed)
Subjective:    Patient ID: Elaine Decker, female    DOB: Jan 24, 1946, 70 y.o.   MRN: [id number]  Congestive Heart Failure  Presents for follow-up visit. The disease course has been improving. Associated symptoms include fatigue (better) and shortness of breath. Pertinent negatives include no abdominal pain, chest pain, edema, orthopnea or palpitations. The symptoms have been improving. Past treatments include digoxin, oxygen, salt and fluid restriction, ACE inhibitors and beta blockers. Compliance with prior treatments has been good. Her past medical history is significant for arrhythmia, chronic lung disease and HTN. There is no history of CVA. She has one 1st degree relative with heart disease. Compliance with total regimen is 76-100%.  Hypertension  This is a chronic problem. The current episode started more than 1 year ago. The problem is unchanged. The problem is controlled. Associated symptoms include anxiety and shortness of breath. Pertinent negatives include no chest pain, neck pain, palpitations or peripheral edema. There are no associated agents to hypertension. Risk factors for coronary artery disease include family history, dyslipidemia, post-menopausal state and smoking/tobacco exposure. Past treatments include beta blockers, calcium channel blockers, diuretics and lifestyle changes. The current treatment provides significant improvement. There are no compliance problems.  Hypertensive end-organ damage includes heart failure. There is no history of CAD/MI.   Past Medical History:  Diagnosis Date  . A-fib ([place])   . Asthma   . CHF (congestive heart failure) ([place])   . CKD (chronic kidney disease)   . COPD (chronic obstructive pulmonary disease) ([place])   . HLD (hyperlipidemia)   . Hypertension     Past Surgical History:  Procedure Laterality Date  . FLEXIBLE BRONCHOSCOPY N/A 02/01/2016   Procedure: FLEXIBLE BRONCHOSCOPY;  Surgeon: Allyne Gee, MD;  Location: [facility] ORS;  Service:  Pulmonary;  Laterality: N/A;  . Surgery on the neck      Family History  Problem Relation Age of Onset  . CAD Mother   . Lung cancer Father   . Arrhythmia Brother     Social History  Substance Use Topics  . Smoking status: Former Smoker    Packs/day: 2.00    Years: 50.00    Quit date: 11/27/2015  . Smokeless tobacco: Never Used  . Alcohol use No    No Known Allergies  Prior to Admission medications   Medication Sig Start Date End Date Taking? Authorizing Provider  acetaminophen (TYLENOL) 325 MG tablet Take 325-650 mg by mouth at bedtime.   Yes Historical Provider, MD  acidophilus (RISAQUAD) CAPS capsule Take 1 capsule by mouth 2 (two) times daily.   Yes Historical Provider, MD  albuterol (PROVENTIL HFA;VENTOLIN HFA) 108 (90 Base) MCG/ACT inhaler Inhale 2 puffs into the lungs every 4 (four) hours as needed for wheezing or shortness of breath.   Yes Historical Provider, MD  atorvastatin (LIPITOR) 20 MG tablet Take 20 mg by mouth daily.   Yes Historical Provider, MD  cholecalciferol (VITAMIN D) 1000 UNITS tablet Take 1,000 Units by mouth daily.   Yes Historical Provider, MD  dabigatran (PRADAXA) 150 MG CAPS capsule Take 150 mg by mouth 2 (two) times daily.   Yes Historical Provider, MD  digoxin (LANOXIN) 0.25 MG tablet Take 1 tablet (0.25 mg total) by mouth daily. 06/26/16  Yes Alisa Graff, FNP  diltiazem (DILACOR XR) 240 MG 24 hr capsule Take 240 mg by mouth daily.   Yes Historical Provider, MD  Fluticasone-Salmeterol (ADVAIR) 250-50 MCG/DOSE AEPB Inhale 1 puff into the lungs 2 (two) times daily.  Yes Historical Provider, MD  furosemide (LASIX) 40 MG tablet Take 1 tablet (40 mg total) by mouth daily. 03/26/16  Yes Gladstone Lighter, MD  ipratropium-albuterol (DUONEB) 0.5-2.5 (3) MG/3ML SOLN Take 3 mLs by nebulization every 6 (six) hours. Patient taking differently: Take 3 mLs by nebulization every 6 (six) hours as needed (for wheezing/shortness of breath).  11/30/15  Yes Fritzi Mandes, MD  lisinopril (PRINIVIL,ZESTRIL) 5 MG tablet TAKE 1 TABLET(5 MG) BY MOUTH DAILY 07/25/16  Yes Alisa Graff, FNP  metoprolol (LOPRESSOR) 100 MG tablet Take 1 tablet (100 mg total) by mouth 2 (two) times daily. Patient taking differently: Take 50 mg by mouth 2 (two) times daily.  07/10/16  Yes Alisa Graff, FNP  montelukast (SINGULAIR) 10 MG tablet Take 10 mg by mouth at bedtime.    Yes Historical Provider, MD  Multiple Vitamin (MULTIVITAMIN) tablet Take 1 tablet by mouth daily.   Yes Historical Provider, MD  tiotropium (SPIRIVA) 18 MCG inhalation capsule Place 18 mcg into inhaler and inhale daily.   Yes Historical Provider, MD      Review of Systems  Constitutional: Positive for fatigue (better). Negative for appetite change.  HENT: Positive for congestion. Negative for rhinorrhea and sore throat.   Eyes: Negative.   Respiratory: Positive for shortness of breath. Negative for chest tightness and wheezing.   Cardiovascular: Negative for chest pain, palpitations and leg swelling.  Gastrointestinal: Negative for abdominal distention and abdominal pain.  Endocrine: Negative.   Genitourinary: Negative.   Musculoskeletal: Negative for back pain and neck pain.  Skin: Negative.   Allergic/Immunologic: Negative.   Neurological: Negative for dizziness and light-headedness.  Hematological: Negative for adenopathy. Bruises/bleeds easily.  Psychiatric/Behavioral: Positive for sleep disturbance (not sleeping well even with CPAP & oxygen). Negative for dysphoric mood. The patient is nervous/anxious.        Objective:   Physical Exam  Constitutional: She is oriented to person, place, and time. She appears well-developed and well-nourished.  HENT:  Head: Normocephalic and atraumatic.  Eyes: Conjunctivae are normal. Pupils are equal, round, and reactive to light.  Neck: Normal range of motion. Neck supple.  Cardiovascular: Normal rate.  An irregular rhythm present.  Pulmonary/Chest: Effort  normal. She has no wheezes. She has no rales.  Abdominal: Soft. She exhibits no distension. There is no tenderness.  Musculoskeletal: She exhibits no edema or tenderness.  Neurological: She is alert and oriented to person, place, and time. She displays tremor (left hand at times).  Skin: Skin is dry.  Psychiatric: She has a normal mood and affect. Her behavior is normal. Thought content normal.  Nursing note and vitals reviewed.  BP (!) 144/64   Pulse 83   Resp 20   Ht 5\' 3"  (1.6 m)   Wt 145 lb (65.8 kg)   SpO2 99% Comment: on 2L  BMI 25.69 kg/m         Assessment & Plan:  1: Chronic heart failure with reduced ejection fraction- Patient presents with shortness of breath with exertion which is quickly improved upon rest. She also reports an improvement in her energy level. She denies any swelling in her legs or abdomen. She continues to weigh herself and has lost weight as she's trying to eat better and move around a little bit more. By our scale, she's lost 16.2 pounds since she was last here on 05/04/16. Reminded her that if she does have a weight gain to call for an overnight weight gain of >2 pounds or a  weekly weight gain of >5 pounds. She is not adding any salt to her food and is trying to closely follow a 2000mg  sodium diet. Does not want to change to entresto at this time, will continue to discuss with her. Saw her cardiologist on 07/02/16. 2: HTN- Blood pressure looks good today. Continue medications at this time. Saw her PCP on 07/02/16. 3: Atrial fibrillation- Currently rate controlled at this time. Taking digoxin, diltiazem and metoprolol along with pradaxa. 4: Tremors- She says that when she exercises, the tremors get worse especially in her left hand.   Patient did not bring her medications nor a list. Each medication was verbally reviewed with the patient and she was encouraged to bring the bottles to every visit to confirm accuracy of list.  Return here in 3 months or sooner  for any questions/problems before then.

## 2016-08-07 DIAGNOSIS — R251 Tremor, unspecified: Secondary | ICD-10-CM | POA: Insufficient documentation

## 2016-11-05 ENCOUNTER — Emergency Department: Payer: Medicare Other

## 2016-11-05 ENCOUNTER — Encounter: Payer: Self-pay | Admitting: *Deleted

## 2016-11-05 ENCOUNTER — Inpatient Hospital Stay
Admission: EM | Admit: 2016-11-05 | Discharge: 2016-11-09 | DRG: 291 | Disposition: A | Discharge status: Home / Self Care | Payer: Medicare Other | Attending: Specialist | Admitting: Specialist

## 2016-11-05 ENCOUNTER — Ambulatory Visit: Payer: BLUE CROSS/BLUE SHIELD | Admitting: Family

## 2016-11-05 DIAGNOSIS — Y92009 Unspecified place in unspecified non-institutional (private) residence as the place of occurrence of the external cause: Secondary | ICD-10-CM

## 2016-11-05 DIAGNOSIS — E876 Hypokalemia: Secondary | ICD-10-CM | POA: Diagnosis present

## 2016-11-05 DIAGNOSIS — J9621 Acute and chronic respiratory failure with hypoxia: Secondary | ICD-10-CM | POA: Diagnosis present

## 2016-11-05 DIAGNOSIS — T502X5A Adverse effect of carbonic-anhydrase inhibitors, benzothiadiazides and other diuretics, initial encounter: Secondary | ICD-10-CM | POA: Diagnosis present

## 2016-11-05 DIAGNOSIS — G4733 Obstructive sleep apnea (adult) (pediatric): Secondary | ICD-10-CM | POA: Diagnosis present

## 2016-11-05 DIAGNOSIS — E785 Hyperlipidemia, unspecified: Secondary | ICD-10-CM | POA: Diagnosis present

## 2016-11-05 DIAGNOSIS — I429 Cardiomyopathy, unspecified: Secondary | ICD-10-CM | POA: Diagnosis present

## 2016-11-05 DIAGNOSIS — I5023 Acute on chronic systolic (congestive) heart failure: Secondary | ICD-10-CM | POA: Diagnosis present

## 2016-11-05 DIAGNOSIS — R001 Bradycardia, unspecified: Secondary | ICD-10-CM | POA: Diagnosis present

## 2016-11-05 DIAGNOSIS — Z9981 Dependence on supplemental oxygen: Secondary | ICD-10-CM | POA: Diagnosis not present

## 2016-11-05 DIAGNOSIS — Z8249 Family history of ischemic heart disease and other diseases of the circulatory system: Secondary | ICD-10-CM

## 2016-11-05 DIAGNOSIS — I482 Chronic atrial fibrillation: Secondary | ICD-10-CM | POA: Diagnosis present

## 2016-11-05 DIAGNOSIS — Z7901 Long term (current) use of anticoagulants: Secondary | ICD-10-CM | POA: Diagnosis not present

## 2016-11-05 DIAGNOSIS — J449 Chronic obstructive pulmonary disease, unspecified: Secondary | ICD-10-CM | POA: Diagnosis present

## 2016-11-05 DIAGNOSIS — Z87891 Personal history of nicotine dependence: Secondary | ICD-10-CM | POA: Diagnosis not present

## 2016-11-05 DIAGNOSIS — Z79899 Other long term (current) drug therapy: Secondary | ICD-10-CM | POA: Diagnosis not present

## 2016-11-05 DIAGNOSIS — N183 Chronic kidney disease, stage 3 (moderate): Secondary | ICD-10-CM | POA: Diagnosis present

## 2016-11-05 DIAGNOSIS — Z91128 Patient's intentional underdosing of medication regimen for other reason: Secondary | ICD-10-CM

## 2016-11-05 DIAGNOSIS — I130 Hypertensive heart and chronic kidney disease with heart failure and stage 1 through stage 4 chronic kidney disease, or unspecified chronic kidney disease: Principal | ICD-10-CM | POA: Diagnosis present

## 2016-11-05 DIAGNOSIS — T501X6A Underdosing of loop [high-ceiling] diuretics, initial encounter: Secondary | ICD-10-CM | POA: Diagnosis present

## 2016-11-05 DIAGNOSIS — Z801 Family history of malignant neoplasm of trachea, bronchus and lung: Secondary | ICD-10-CM

## 2016-11-05 DIAGNOSIS — I509 Heart failure, unspecified: Secondary | ICD-10-CM

## 2016-11-05 LAB — CBC
HCT: 36.1 % (ref 35.0–47.0)
HEMOGLOBIN: 11.7 g/dL — AB (ref 12.0–16.0)
MCH: 28.7 pg (ref 26.0–34.0)
MCHC: 32.5 g/dL (ref 32.0–36.0)
MCV: 88.1 fL (ref 80.0–100.0)
Platelets: 285 10*3/uL (ref 150–440)
RBC: 4.09 MIL/uL (ref 3.80–5.20)
RDW: 16.7 % — ABNORMAL HIGH (ref 11.5–14.5)
WBC: 8.4 10*3/uL (ref 3.6–11.0)

## 2016-11-05 LAB — BASIC METABOLIC PANEL
ANION GAP: 6 (ref 5–15)
BUN: 16 mg/dL (ref 6–20)
CALCIUM: 9.1 mg/dL (ref 8.9–10.3)
CO2: 40 mmol/L — ABNORMAL HIGH (ref 22–32)
Chloride: 93 mmol/L — ABNORMAL LOW (ref 101–111)
Creatinine, Ser: 0.84 mg/dL (ref 0.44–1.00)
GLUCOSE: 187 mg/dL — AB (ref 65–99)
Potassium: 3.8 mmol/L (ref 3.5–5.1)
SODIUM: 139 mmol/L (ref 135–145)

## 2016-11-05 LAB — DIGOXIN LEVEL: Digoxin Level: 2.6 ng/mL (ref 0.8–2.0)

## 2016-11-05 LAB — TROPONIN I
TROPONIN I: 0.04 ng/mL — AB (ref <0.03)
TROPONIN I: 0.04 ng/mL — AB (ref <0.03)

## 2016-11-05 MED ORDER — DABIGATRAN ETEXILATE MESYLATE 150 MG PO CAPS
150.0000 mg | ORAL_CAPSULE | Freq: Two times a day (BID) | ORAL | Status: DC
  Administered 2016-11-05 – 2016-11-09 (×8): 150 mg via ORAL
  Filled 2016-11-05 (×9): qty 1

## 2016-11-05 MED ORDER — LISINOPRIL 5 MG PO TABS
5.0000 mg | ORAL_TABLET | Freq: Every day | ORAL | Status: DC
  Administered 2016-11-05 – 2016-11-09 (×5): 5 mg via ORAL
  Filled 2016-11-05 (×6): qty 1

## 2016-11-05 MED ORDER — ONDANSETRON HCL 4 MG PO TABS: 4.0000 mg | ORAL_TABLET | Freq: Four times a day (QID) | ORAL | Status: DC | PRN

## 2016-11-05 MED ORDER — VITAMIN D 1000 UNITS PO TABS
1000.0000 [IU] | ORAL_TABLET | Freq: Every day | ORAL | Status: DC
  Administered 2016-11-06 – 2016-11-09 (×4): 1000 [IU] via ORAL
  Filled 2016-11-05 (×4): qty 1

## 2016-11-05 MED ORDER — ACETAMINOPHEN 650 MG RE SUPP: 650.0000 mg | Freq: Four times a day (QID) | RECTAL | Status: DC | PRN

## 2016-11-05 MED ORDER — RISAQUAD PO CAPS
1.0000 | ORAL_CAPSULE | Freq: Two times a day (BID) | ORAL | Status: DC
  Administered 2016-11-06 – 2016-11-09 (×3): 1 via ORAL
  Filled 2016-11-05 (×5): qty 1

## 2016-11-05 MED ORDER — MONTELUKAST SODIUM 10 MG PO TABS
10.0000 mg | ORAL_TABLET | Freq: Every day | ORAL | Status: DC
  Administered 2016-11-05 – 2016-11-08 (×4): 10 mg via ORAL
  Filled 2016-11-05 (×4): qty 1

## 2016-11-05 MED ORDER — ONDANSETRON HCL 4 MG/2ML IJ SOLN: 4.0000 mg | Freq: Four times a day (QID) | INTRAMUSCULAR | Status: DC | PRN

## 2016-11-05 MED ORDER — MOMETASONE FURO-FORMOTEROL FUM 200-5 MCG/ACT IN AERO
2.0000 | INHALATION_SPRAY | Freq: Two times a day (BID) | RESPIRATORY_TRACT | Status: DC
  Administered 2016-11-05 – 2016-11-09 (×8): 2 via RESPIRATORY_TRACT
  Filled 2016-11-05: qty 8.8

## 2016-11-05 MED ORDER — IPRATROPIUM-ALBUTEROL 0.5-2.5 (3) MG/3ML IN SOLN: 3.0000 mL | RESPIRATORY_TRACT | Status: DC | PRN

## 2016-11-05 MED ORDER — DIGOXIN 250 MCG PO TABS: 0.2500 mg | ORAL_TABLET | Freq: Every day | ORAL | Status: DC

## 2016-11-05 MED ORDER — DILTIAZEM HCL ER 240 MG PO CP24
240.0000 mg | ORAL_CAPSULE | Freq: Every day | ORAL | Status: DC
  Filled 2016-11-05: qty 1

## 2016-11-05 MED ORDER — ADULT MULTIVITAMIN W/MINERALS CH
1.0000 | ORAL_TABLET | Freq: Every day | ORAL | Status: DC
  Administered 2016-11-06 – 2016-11-09 (×4): 1 via ORAL
  Filled 2016-11-05 (×4): qty 1

## 2016-11-05 MED ORDER — ATORVASTATIN CALCIUM 20 MG PO TABS
20.0000 mg | ORAL_TABLET | Freq: Every day | ORAL | Status: DC
  Administered 2016-11-05 – 2016-11-09 (×5): 20 mg via ORAL
  Filled 2016-11-05 (×5): qty 1

## 2016-11-05 MED ORDER — ENOXAPARIN SODIUM 40 MG/0.4ML ~~LOC~~ SOLN: 40.0000 mg | SUBCUTANEOUS | Status: DC

## 2016-11-05 MED ORDER — METOPROLOL TARTRATE 50 MG PO TABS
50.0000 mg | ORAL_TABLET | Freq: Two times a day (BID) | ORAL | Status: DC
  Filled 2016-11-05: qty 1

## 2016-11-05 MED ORDER — SODIUM CHLORIDE 0.9% FLUSH
3.0000 mL | Freq: Two times a day (BID) | INTRAVENOUS | Status: DC
Start: 2016-11-05 — End: 2016-11-09
  Administered 2016-11-05 – 2016-11-09 (×8): 3 mL via INTRAVENOUS

## 2016-11-05 MED ORDER — FUROSEMIDE 10 MG/ML IJ SOLN
40.0000 mg | Freq: Two times a day (BID) | INTRAMUSCULAR | Status: DC
  Administered 2016-11-06 – 2016-11-09 (×7): 40 mg via INTRAVENOUS
  Filled 2016-11-05 (×7): qty 4

## 2016-11-05 MED ORDER — OXYCODONE HCL 5 MG PO TABS: 5.0000 mg | ORAL_TABLET | ORAL | Status: DC | PRN

## 2016-11-05 MED ORDER — FUROSEMIDE 10 MG/ML IJ SOLN: 60.0000 mg | Freq: Once | INTRAMUSCULAR | Status: DC

## 2016-11-05 MED ORDER — TIOTROPIUM BROMIDE MONOHYDRATE 18 MCG IN CAPS
18.0000 ug | ORAL_CAPSULE | Freq: Every day | RESPIRATORY_TRACT | Status: DC
  Administered 2016-11-06 – 2016-11-09 (×4): 18 ug via RESPIRATORY_TRACT
  Filled 2016-11-05: qty 5

## 2016-11-05 MED ORDER — ACETAMINOPHEN 325 MG PO TABS: 650.0000 mg | ORAL_TABLET | Freq: Four times a day (QID) | ORAL | Status: DC | PRN

## 2016-11-05 NOTE — ED Notes (Signed)
Troponin of 0.04 reported to Dr Archie Balboa.

## 2016-11-05 NOTE — ED Notes (Signed)
Dr. Lavetta Nielsen informed pt has not received IV lasix due to patient in hallway stretcher and pt's access to stretcher. MD agreeable to plan for patient to receive once she is on the inpatient floor.

## 2016-11-05 NOTE — ED Provider Notes (Signed)
[facility] Emergency Department Provider Note  ____________________________________________  Time seen: Approximately 5:46 PM  I have reviewed the triage vital signs and the nursing notes.   HISTORY  Chief Complaint Shortness of Breath and Leg Swelling    HPI Elaine Decker is a 70 y.o. female who complains of shortness of breath which is worse with exertion or laying flat. She reports she has a history of CHF, has not been taking her Lasix over the last 2 weeks because it causes her to have to urinate a lot while she is at work which is very inconvenient sometimes causes her to lose control of her urine. No chest pain or other pain. No fever or cough. He wears 2 L nasal cannula at baseline.     Past Medical History:  Diagnosis Date  . A-fib ([place])   . Asthma   . CHF (congestive heart failure) ([place])   . CKD (chronic kidney disease)   . COPD (chronic obstructive pulmonary disease) ([place])   . HLD (hyperlipidemia)   . Hypertension      Patient Active Problem List   Diagnosis Date Noted  . Occasional tremors 08/07/2016  . Chronic systolic heart failure ([place]) 04/04/2016  . COPD (chronic obstructive pulmonary disease) with chronic bronchitis ([place]) 04/04/2016  . Obstructive sleep apnea 04/04/2016  . Bilateral pneumonia 01/24/2016  . Status post thoracentesis 01/24/2016  . Prediabetes 01/24/2016  . Swelling of lower extremity 01/24/2016  . Generalized weakness 01/24/2016  . Paroxysmal atrial fibrillation ([place]) 01/19/2016  . Essential hypertension 01/19/2016  . Recurrent right pleural effusion 01/19/2016  . HLD (hyperlipidemia) 01/19/2016  . Rapid atrial fibrillation ([place]) 11/28/2015     Past Surgical History:  Procedure Laterality Date  . FLEXIBLE BRONCHOSCOPY N/A 02/01/2016   Procedure: FLEXIBLE BRONCHOSCOPY;  Surgeon: Allyne Gee, MD;  Location: [facility] ORS;  Service: Pulmonary;  Laterality: N/A;  . Surgery on the neck       Prior to  Admission medications   Medication Sig Start Date End Date Taking? Authorizing Provider  acetaminophen (TYLENOL) 325 MG tablet Take 325-650 mg by mouth at bedtime.    Historical Provider, MD  acidophilus (RISAQUAD) CAPS capsule Take 1 capsule by mouth 2 (two) times daily.    Historical Provider, MD  albuterol (PROVENTIL HFA;VENTOLIN HFA) 108 (90 Base) MCG/ACT inhaler Inhale 2 puffs into the lungs every 4 (four) hours as needed for wheezing or shortness of breath.    Historical Provider, MD  atorvastatin (LIPITOR) 20 MG tablet Take 20 mg by mouth daily.    Historical Provider, MD  cholecalciferol (VITAMIN D) 1000 UNITS tablet Take 1,000 Units by mouth daily.    Historical Provider, MD  dabigatran (PRADAXA) 150 MG CAPS capsule Take 150 mg by mouth 2 (two) times daily.    Historical Provider, MD  digoxin (LANOXIN) 0.25 MG tablet Take 1 tablet (0.25 mg total) by mouth daily. 06/26/16   Alisa Graff, FNP  diltiazem (DILACOR XR) 240 MG 24 hr capsule Take 240 mg by mouth daily.    Historical Provider, MD  Fluticasone-Salmeterol (ADVAIR) 250-50 MCG/DOSE AEPB Inhale 1 puff into the lungs 2 (two) times daily.    Historical Provider, MD  furosemide (LASIX) 40 MG tablet Take 1 tablet (40 mg total) by mouth daily. 03/26/16   Gladstone Lighter, MD  ipratropium-albuterol (DUONEB) 0.5-2.5 (3) MG/3ML SOLN Take 3 mLs by nebulization every 6 (six) hours. Patient taking differently: Take 3 mLs by nebulization every 6 (six) hours as needed (for  wheezing/shortness of breath).  11/30/15   Fritzi Mandes, MD  lisinopril (PRINIVIL,ZESTRIL) 5 MG tablet TAKE 1 TABLET(5 MG) BY MOUTH DAILY 07/25/16   Alisa Graff, FNP  metoprolol (LOPRESSOR) 100 MG tablet Take 1 tablet (100 mg total) by mouth 2 (two) times daily. Patient taking differently: Take 50 mg by mouth 2 (two) times daily.  07/10/16   Alisa Graff, FNP  montelukast (SINGULAIR) 10 MG tablet Take 10 mg by mouth at bedtime.     Historical Provider, MD  Multiple Vitamin  (MULTIVITAMIN) tablet Take 1 tablet by mouth daily.    Historical Provider, MD  tiotropium (SPIRIVA) 18 MCG inhalation capsule Place 18 mcg into inhaler and inhale daily.    Historical Provider, MD     Allergies Patient has no known allergies.   Family History  Problem Relation Age of Onset  . CAD Mother   . Lung cancer Father   . Arrhythmia Brother     Social History Social History  Substance Use Topics  . Smoking status: Former Smoker    Packs/day: 2.00    Years: 50.00    Quit date: 11/27/2015  . Smokeless tobacco: Never Used  . Alcohol use No    Review of Systems  Constitutional:   No fever or chills.  ENT:   No sore throat. No rhinorrhea. Cardiovascular:   No chest pain. Respiratory:   Positive shortness of breath without cough. Gastrointestinal:   Negative for abdominal pain, vomiting and diarrhea. Positive abdominal bloating  Genitourinary:   Negative for dysuria or difficulty urinating. Musculoskeletal:  As noted bilateral lower extremity swelling  10-point ROS otherwise negative.  ____________________________________________   PHYSICAL EXAM:  VITAL SIGNS: ED Triage Vitals  Enc Vitals Group     BP 11/05/16 1535 139/63     Pulse Rate 11/05/16 1535 (!) 59     Resp 11/05/16 1535 20     Temp 11/05/16 1535 97.7 F (36.5 C)     Temp Source 11/05/16 1535 Oral     SpO2 11/05/16 1535 95 %     Weight 11/05/16 1535 145 lb (65.8 kg)     Height 11/05/16 1535 5\' 3"  (1.6 m)     Head Circumference --      Peak Flow --      Pain Score 11/05/16 1740 0     Pain Loc --      Pain Edu? --      Excl. in GC? --    Oxygen saturation 89% on 2 L nasal cannula.  Vital signs reviewed, nursing assessments reviewed.   Constitutional:   Alert and oriented. Well appearing, mild respiratory distress. Eyes:   No scleral icterus. No conjunctival pallor. PERRL. EOMI.  No nystagmus. ENT   Head:   Normocephalic and atraumatic.   Nose:   No congestion/rhinnorhea. No  septal hematoma   Mouth/Throat:   MMM, no pharyngeal erythema. No peritonsillar mass.    Neck:   No stridor. No SubQ emphysema. No meningismus. Hematological/Lymphatic/Immunilogical:   No cervical lymphadenopathy. Cardiovascular:   RRR. Symmetric bilateral radial and DP pulses.  No murmurs.  Respiratory:   Diffuse crackles. Diminished breath sounds at the bases. Gastrointestinal:   Soft and nontender. Non distended. There is no CVA tenderness.  No rebound, rigidity, or guarding. Genitourinary:   deferred Musculoskeletal:   Nontender with normal range of motion in all extremities. No joint effusions.  No lower extremity tenderness.  3+ pitting edema bilaterally, symmetric. Neurologic:   Normal speech and language.  CN 2-10 normal. Motor grossly intact. No gross focal neurologic deficits are appreciated.  Skin:    Skin is warm, dry and intact. No rash noted.  No petechiae, purpura, or bullae.  ____________________________________________    LABS (pertinent positives/negatives) (all labs ordered are listed, but only abnormal results are displayed) Labs Reviewed  BASIC METABOLIC PANEL - Abnormal; Notable for the following:       Result Value   Chloride 93 (*)    CO2 40 (*)    Glucose, Bld 187 (*)    All other components within normal limits  CBC - Abnormal; Notable for the following:    Hemoglobin 11.7 (*)    RDW 16.7 (*)    All other components within normal limits  TROPONIN I - Abnormal; Notable for the following:    Troponin I 0.04 (*)    All other components within normal limits   ____________________________________________   EKG  Interpreted by me Atrial fibrillation rate of 49, normal axis. Poor R-wave progression in anterior precordial leads. Normal ST segments and T leads. No acute ischemic changes.  ____________________________________________    RADIOLOGY  Chest x-ray shows new moderate pleural effusion, pulmonary  edema.  ____________________________________________   PROCEDURES Procedures  ____________________________________________   INITIAL IMPRESSION / ASSESSMENT AND PLAN / ED COURSE  Pertinent labs & imaging results that were available during my care of the patient were reviewed by me and considered in my medical decision making (see chart for details).  Patient presents with shortness of breath, presentation consistent with CHF with pulmonary edema. We'll give IV Lasix to initiate diuresis. Patient is significantly volume overloaded, so I discussed the case with hospitalist for further management as she will likely require some prolonged diuresis.     Clinical Course    ____________________________________________   FINAL CLINICAL IMPRESSION(S) / ED DIAGNOSES  Final diagnoses:  Acute on chronic congestive heart failure, unspecified congestive heart failure type ([place])  Acute on chronic respiratory failure with hypoxia ([facility])       Portions of this note were generated with dragon dictation software. Dictation errors may occur despite best attempts at proofreading.    Carrie Mew, MD 11/05/16 [phone]

## 2016-11-05 NOTE — ED Notes (Signed)
MD at bedside. 

## 2016-11-05 NOTE — ED Triage Notes (Addendum)
Pt to triage via wheelchair.  Pt is on 2 liters oxygen at home  Pt alert.  Pt has increased sob for 2 weeks with worsening sx today.  Pt was seen in ER at [facility] today and had cxr and was told to double lasix.  Pt continues to be sob.  No chest pain.  No cough.  No fever.

## 2016-11-05 NOTE — H&P (Signed)
[facility] NAME: Elaine Decker    MR#:  [id number]  DATE OF BIRTH:  [birth year]   DATE OF ADMISSION:  11/05/2016  PRIMARY CARE PHYSICIAN: Glendon Axe, MD   REQUESTING/REFERRING PHYSICIAN: Joni Fears  CHIEF COMPLAINT:   Chief Complaint  Patient presents with  . Shortness of Breath  . Leg Swelling    HISTORY OF PRESENT ILLNESS:  Elaine Decker  is a 70 y.o. female with a known history of Systolic congestive heart failure who is presenting with shortness of breath. She is on chronic oxygen at baseline 2 L nasal cannula starting in December 2016 she has been inconsistently compliant with her medications leading her to develop shortness of breath lower extremity swelling over the last week which is been progressively worsening. Mainly dyspnea on exertion with edema some orthopnea as well  PAST MEDICAL HISTORY:   Past Medical History:  Diagnosis Date  . A-fib ([place])   . Asthma   . CHF (congestive heart failure) ([place])   . CKD (chronic kidney disease)   . COPD (chronic obstructive pulmonary disease) ([place])   . HLD (hyperlipidemia)   . Hypertension     PAST SURGICAL HISTORY:   Past Surgical History:  Procedure Laterality Date  . FLEXIBLE BRONCHOSCOPY N/A 02/01/2016   Procedure: FLEXIBLE BRONCHOSCOPY;  Surgeon: Allyne Gee, MD;  Location: [facility] ORS;  Service: Pulmonary;  Laterality: N/A;  . Surgery on the neck      SOCIAL HISTORY:   Social History  Substance Use Topics  . Smoking status: Former Smoker    Packs/day: 2.00    Years: 50.00    Quit date: 11/27/2015  . Smokeless tobacco: Never Used  . Alcohol use No    FAMILY HISTORY:   Family History  Problem Relation Age of Onset  . CAD Mother   . Lung cancer Father   . Arrhythmia Brother     DRUG ALLERGIES:  No Known Allergies  REVIEW OF SYSTEMS:  REVIEW OF SYSTEMS:  CONSTITUTIONAL: Denies fevers, chills, fatigue, weakness.  EYES: Denies blurred vision,  double vision, or eye pain.  EARS, NOSE, THROAT: Denies tinnitus, ear pain, hearing loss.  RESPIRATORY: denies cough, Positive shortness of breath, denies wheezing  CARDIOVASCULAR: Denies chest pain, palpitations, positive edema.  GASTROINTESTINAL: Denies nausea, vomiting, diarrhea, abdominal pain.  GENITOURINARY: Denies dysuria, hematuria.  ENDOCRINE: Denies nocturia or thyroid problems. HEMATOLOGIC AND LYMPHATIC: Denies easy bruising or bleeding.  SKIN: Denies rash or lesions.  MUSCULOSKELETAL: Denies pain in neck, back, shoulder, knees, hips, or further arthritic symptoms.  NEUROLOGIC: Denies paralysis, paresthesias.  PSYCHIATRIC: Denies anxiety or depressive symptoms. Otherwise full review of systems performed by me is negative.   MEDICATIONS AT HOME:   Prior to Admission medications   Medication Sig Start Date End Date Taking? Authorizing Provider  acetaminophen (TYLENOL) 325 MG tablet Take 325-650 mg by mouth at bedtime.    Historical Provider, MD  acidophilus (RISAQUAD) CAPS capsule Take 1 capsule by mouth 2 (two) times daily.    Historical Provider, MD  albuterol (PROVENTIL HFA;VENTOLIN HFA) 108 (90 Base) MCG/ACT inhaler Inhale 2 puffs into the lungs every 4 (four) hours as needed for wheezing or shortness of breath.    Historical Provider, MD  atorvastatin (LIPITOR) 20 MG tablet Take 20 mg by mouth daily.    Historical Provider, MD  cholecalciferol (VITAMIN D) 1000 UNITS tablet Take 1,000 Units by mouth daily.    Historical Provider, MD  dabigatran (PRADAXA) 150 MG  CAPS capsule Take 150 mg by mouth 2 (two) times daily.    Historical Provider, MD  digoxin (LANOXIN) 0.25 MG tablet Take 1 tablet (0.25 mg total) by mouth daily. 06/26/16   Alisa Graff, FNP  diltiazem (DILACOR XR) 240 MG 24 hr capsule Take 240 mg by mouth daily.    Historical Provider, MD  Fluticasone-Salmeterol (ADVAIR) 250-50 MCG/DOSE AEPB Inhale 1 puff into the lungs 2 (two) times daily.    Historical Provider, MD    furosemide (LASIX) 40 MG tablet Take 1 tablet (40 mg total) by mouth daily. 03/26/16   Gladstone Lighter, MD  ipratropium-albuterol (DUONEB) 0.5-2.5 (3) MG/3ML SOLN Take 3 mLs by nebulization every 6 (six) hours. Patient taking differently: Take 3 mLs by nebulization every 6 (six) hours as needed (for wheezing/shortness of breath).  11/30/15   Fritzi Mandes, MD  lisinopril (PRINIVIL,ZESTRIL) 5 MG tablet TAKE 1 TABLET(5 MG) BY MOUTH DAILY 07/25/16   Alisa Graff, FNP  metoprolol (LOPRESSOR) 100 MG tablet Take 1 tablet (100 mg total) by mouth 2 (two) times daily. Patient taking differently: Take 50 mg by mouth 2 (two) times daily.  07/10/16   Alisa Graff, FNP  montelukast (SINGULAIR) 10 MG tablet Take 10 mg by mouth at bedtime.     Historical Provider, MD  Multiple Vitamin (MULTIVITAMIN) tablet Take 1 tablet by mouth daily.    Historical Provider, MD  tiotropium (SPIRIVA) 18 MCG inhalation capsule Place 18 mcg into inhaler and inhale daily.    Historical Provider, MD      VITAL SIGNS:  Blood pressure (!) 142/71, pulse (!) 48, temperature 97.7 F (36.5 C), temperature source Oral, resp. rate 16, height 5\' 3"  (1.6 m), weight 65.8 kg (145 lb), SpO2 93 %.  PHYSICAL EXAMINATION:  VITAL SIGNS: Vitals:   11/05/16 1535 11/05/16 1740  BP: 139/63 (!) 142/71  Pulse: (!) 59 (!) 48  Resp: 20 16  Temp: 97.7 F (36.5 C)    GENERAL:70 y.o.female currently in no acute distress.  HEAD: Normocephalic, atraumatic.  EYES: Pupils equal, round, reactive to light. Extraocular muscles intact. No scleral icterus.  MOUTH: Moist mucosal membrane. Dentition intact. No abscess noted.  EAR, NOSE, THROAT: Clear without exudates. No external lesions.  NECK: Supple. No thyromegaly. No nodules. No JVD.  PULMONARY: Left sided basilar crackles with overall diminished breath sounds without wheeze No use of accessory muscles, Good respiratory effort. good air entry bilaterally CHEST: Nontender to palpation.   CARDIOVASCULAR: S1 and S2. Regular rate and rhythm. No murmurs, rubs, or gallops. 2-3+ edema up to the thighs. Pedal pulses 2+ bilaterally.  GASTROINTESTINAL: Soft, nontender, nondistended. No masses. Positive bowel sounds. No hepatosplenomegaly.  MUSCULOSKELETAL: No swelling, clubbing, or edema. Range of motion full in all extremities.  NEUROLOGIC: Cranial nerves II through XII are intact. No gross focal neurological deficits. Sensation intact. Reflexes intact.  SKIN: No ulceration, lesions, rashes, or cyanosis. Skin warm and dry. Turgor intact.  PSYCHIATRIC: Mood, affect within normal limits. The patient is awake, alert and oriented x 3. Insight, judgment intact.    LABORATORY PANEL:   CBC  Recent Labs Lab 11/05/16 1536  WBC 8.4  HGB 11.7*  HCT 36.1  PLT 285   ------------------------------------------------------------------------------------------------------------------  Chemistries   Recent Labs Lab 11/05/16 1536  NA 139  K 3.8  CL 93*  CO2 40*  GLUCOSE 187*  BUN 16  CREATININE 0.84  CALCIUM 9.1   ------------------------------------------------------------------------------------------------------------------  Cardiac Enzymes  Recent Labs Lab 11/05/16 1536  TROPONINI 0.04*   ------------------------------------------------------------------------------------------------------------------  RADIOLOGY:  Dg Chest 2 View  Result Date: 11/05/2016 CLINICAL DATA:  Increased shortness of breath for the past 2 weeks with increased symptoms today. No chest pain or cough. The patient is on home oxygen. EXAM: CHEST  2 VIEW COMPARISON:  PA and lateral chest x-ray of July 11, 2016 FINDINGS: There is new volume loss on the right consistent with a moderate-sized pleural effusion occupying approximately 1/4 to 1/3 of the pleural space volume. The interstitial markings of both lungs are increased. There is a trace of pleural fluid blunting the left costophrenic angles. The  cardiac silhouette is enlarged. The pulmonary vascularity is engorged. There is calcification in the wall of the aortic arch. There are multiple old rib deformities bilaterally. The thoracic vertebral bodies are grossly normal in height. IMPRESSION: CHF superimposed upon COPD. New moderate-sized right pleural effusion. Small left pleural effusion. Pulmonary interstitial edema. Thoracic aortic atherosclerosis. Electronically Signed   By: Schneider Warchol  [place] M.D.   On: 11/05/2016 16:48    EKG:   Orders placed or performed during the hospital encounter of 11/05/16  . ED EKG within 10 minutes  . ED EKG within 10 minutes  . EKG 12-Lead  . EKG 12-Lead    IMPRESSION AND PLAN:   70 year old Caucasian female history of systolic congestive heart failure presenting with shortness of breath and edema  1. Acute on Chronic systolic congestive heart failure: Place on telemetry, trend cardiac enzymes, provide supplemental oxygen, breathing treatments as required, echocardiogram, diuresis with IV Lasix, add beta blocker/ACE inhibitor as blood pressure allows, consult cardiology  2. COPD not in acute exacerbation: Continue home medications 3. Essential hypertension continue lisinopril Lopressor 4. Hyperlipidemia unspecified statin therapy  All the records are reviewed and case discussed with ED provider. Management plans discussed with the patient, family and they are in agreement.  CODE STATUS: Full  TOTAL TIME TAKING CARE OF THIS PATIENT: 33 minutes.    Bentlie Withem,  Karenann Cai.D on 11/05/2016 at 6:21 PM  Between 7am to 6pm - Pager - [phone]  After 6pm: House Pager: - [facility]  Office  [phone]  CC: Primary care physician; Glendon Axe, MD

## 2016-11-06 ENCOUNTER — Inpatient Hospital Stay
Admit: 2016-11-06 | Discharge: 2016-11-06 | Disposition: A | Discharge status: Home / Self Care | Payer: Medicare Other | Attending: Internal Medicine | Admitting: Internal Medicine

## 2016-11-06 LAB — BASIC METABOLIC PANEL
Anion gap: 3 — ABNORMAL LOW (ref 5–15)
BUN: 17 mg/dL (ref 6–20)
CALCIUM: 8.7 mg/dL — AB (ref 8.9–10.3)
CO2: 41 mmol/L — AB (ref 22–32)
CREATININE: 0.78 mg/dL (ref 0.44–1.00)
Chloride: 97 mmol/L — ABNORMAL LOW (ref 101–111)
Glucose, Bld: 139 mg/dL — ABNORMAL HIGH (ref 65–99)
Potassium: 3.6 mmol/L (ref 3.5–5.1)
Sodium: 141 mmol/L (ref 135–145)

## 2016-11-06 LAB — MAGNESIUM: Magnesium: 1.7 mg/dL (ref 1.7–2.4)

## 2016-11-06 LAB — ECHOCARDIOGRAM COMPLETE
HEIGHTINCHES: 63 in
WEIGHTICAEL: 2737.6 [oz_av]

## 2016-11-06 LAB — PHOSPHORUS: PHOSPHORUS: 4.4 mg/dL (ref 2.5–4.6)

## 2016-11-06 LAB — TROPONIN I
TROPONIN I: 0.04 ng/mL — AB (ref <0.03)
Troponin I: 0.04 ng/mL (ref <0.03)

## 2016-11-06 MED ORDER — DIGOXIN 125 MCG PO TABS: 0.1250 mg | ORAL_TABLET | Freq: Every day | ORAL | Status: DC

## 2016-11-06 MED ORDER — METOPROLOL TARTRATE 25 MG PO TABS
25.0000 mg | ORAL_TABLET | Freq: Two times a day (BID) | ORAL | Status: DC
  Administered 2016-11-07 – 2016-11-09 (×6): 25 mg via ORAL
  Filled 2016-11-06 (×7): qty 1

## 2016-11-06 NOTE — Progress Notes (Signed)
Spoke with dr. Verdell Carmine to make aware patients heart rate is ranging in the 50's will drop into the 40's and up to the 60's. Made md aware have held metoprolol and cardizem

## 2016-11-06 NOTE — Consult Note (Signed)
Reason for Consult: Congestive heart failure leg edema shortness of breath Referring Physician: Dr. Lavetta Nielsen hospitalist, Glendon Axe primary Cardiologist [place]  Elaine Decker is an 70 y.o. female.  HPI: Patient's a 70 year old female with moderate to severe COPD congestive heart failure with systolic dysfunction cardiomyopathy chronic atrial fibrillation who is on diuretics and home O2. Patient states SIGNIFICANT bilateral leg edema over the last few weeks. Patient states that she's reduced her diuretic intake because of intravenous: To the bathroom and keep troponin I. Over the course of the last few weeks she developed significant leg swelling up to her thighs she's been short of breath dyspnea fatigue and weak. Patient has had persistent shortness of breath no sputum production no significant cough. She's had some mild PND and orthopnea. Patient states she hasn't slept well recently has obstructive sleep apnea and wears a mask but recently the mask has not been helping. She gives a history of atrial fibrillation chronic controlled rate with metoprolol diltiazem and digoxin denies any significant palpitations or tachycardia. Patient also denies any significant chest pain. Patient states to be a former smoker has generalized weakness as well as some trouble with hypertension. She came to the hospital to try to get the fluid off and she states she'll try to be more compliant in the future  Past Medical History:  Diagnosis Date  . A-fib ([place])   . Asthma   . CHF (congestive heart failure) ([place])   . CKD (chronic kidney disease)   . COPD (chronic obstructive pulmonary disease) ([place])   . HLD (hyperlipidemia)   . Hypertension     Past Surgical History:  Procedure Laterality Date  . FLEXIBLE BRONCHOSCOPY N/A 02/01/2016   Procedure: FLEXIBLE BRONCHOSCOPY;  Surgeon: Allyne Gee, MD;  Location: [facility] ORS;  Service: Pulmonary;  Laterality: N/A;  . Surgery on the neck      Family History  Problem  Relation Age of Onset  . CAD Mother   . Lung cancer Father   . Arrhythmia Brother     Social History:  reports that she quit smoking about a year ago. She has a 100.00 pack-year smoking history. She has never used smokeless tobacco. She reports that she does not drink alcohol or use drugs.  Allergies: No Known Allergies  Medications: I have reviewed the patient's current medications.  Results for orders placed or performed during the hospital encounter of 11/05/16 (from the past 48 hour(s))  Basic metabolic panel     Status: Abnormal   Collection Time: 11/05/16  3:36 PM  Result Value Ref Range   Sodium 139 135 - 145 mmol/L   Potassium 3.8 3.5 - 5.1 mmol/L   Chloride 93 (L) 101 - 111 mmol/L   CO2 40 (H) 22 - 32 mmol/L   Glucose, Bld 187 (H) 65 - 99 mg/dL   BUN 16 6 - 20 mg/dL   Creatinine, Ser 0.84 0.44 - 1.00 mg/dL   Calcium 9.1 8.9 - 10.3 mg/dL   GFR calc non Af Amer >60 >60 mL/min   GFR calc Af Amer >60 >60 mL/min    Comment: (NOTE) The eGFR has been calculated using the CKD EPI equation. This calculation has not been validated in all clinical situations. eGFR's persistently <60 mL/min signify possible Chronic Kidney Disease.    Anion gap 6 5 - 15  CBC     Status: Abnormal   Collection Time: 11/05/16  3:36 PM  Result Value Ref Range   WBC 8.4 3.6 - 11.0  K/uL   RBC 4.09 3.80 - 5.20 MIL/uL   Hemoglobin 11.7 (L) 12.0 - 16.0 g/dL   HCT 36.1 35.0 - 47.0 %   MCV 88.1 80.0 - 100.0 fL   MCH 28.7 26.0 - 34.0 pg   MCHC 32.5 32.0 - 36.0 g/dL   RDW 16.7 (H) 11.5 - 14.5 %   Platelets 285 150 - 440 K/uL  Troponin I     Status: Abnormal   Collection Time: 11/05/16  3:36 PM  Result Value Ref Range   Troponin I 0.04 (HH) <0.03 ng/mL    Comment: CRITICAL RESULT CALLED TO, READ BACK BY AND VERIFIED WITH Velta Addison RN AT 7829 11/05/16 MSS.   Digoxin level     Status: Abnormal   Collection Time: 11/05/16  3:36 PM  Result Value Ref Range   Digoxin Level 2.6 (HH) 0.8 - 2.0 ng/mL     Comment: CRITICAL RESULT CALLED TO, READ BACK BY AND VERIFIED WITH KATIE NEWSHOLME RN AT 1845 11/05/16 MSS.   Troponin I (q 6hr x 3)     Status: Abnormal   Collection Time: 11/05/16  8:53 PM  Result Value Ref Range   Troponin I 0.04 (HH) <0.03 ng/mL    Comment: CRITICAL VALUE NOTED. VALUE IS CONSISTENT WITH PREVIOUSLY REPORTED/CALLED VALUE. MSS  Basic metabolic panel     Status: Abnormal   Collection Time: 11/06/16  2:34 AM  Result Value Ref Range   Sodium 141 135 - 145 mmol/L   Potassium 3.6 3.5 - 5.1 mmol/L   Chloride 97 (L) 101 - 111 mmol/L   CO2 41 (H) 22 - 32 mmol/L   Glucose, Bld 139 (H) 65 - 99 mg/dL   BUN 17 6 - 20 mg/dL   Creatinine, Ser 0.78 0.44 - 1.00 mg/dL   Calcium 8.7 (L) 8.9 - 10.3 mg/dL   GFR calc non Af Amer >60 >60 mL/min   GFR calc Af Amer >60 >60 mL/min    Comment: (NOTE) The eGFR has been calculated using the CKD EPI equation. This calculation has not been validated in all clinical situations. eGFR's persistently <60 mL/min signify possible Chronic Kidney Disease.    Anion gap 3 (L) 5 - 15  Troponin I (q 6hr x 3)     Status: Abnormal   Collection Time: 11/06/16  2:34 AM  Result Value Ref Range   Troponin I 0.04 (HH) <0.03 ng/mL    Comment: CRITICAL VALUE NOTED. VALUE IS CONSISTENT WITH PREVIOUSLY REPORTED/CALLED VALUE...[facility]  Magnesium     Status: None   Collection Time: 11/06/16  2:34 AM  Result Value Ref Range   Magnesium 1.7 1.7 - 2.4 mg/dL  Phosphorus     Status: None   Collection Time: 11/06/16  2:34 AM  Result Value Ref Range   Phosphorus 4.4 2.5 - 4.6 mg/dL  Troponin I (q 6hr x 3)     Status: Abnormal   Collection Time: 11/06/16  9:08 AM  Result Value Ref Range   Troponin I 0.04 (HH) <0.03 ng/mL    Comment: CRITICAL VALUE NOTED. VALUE IS CONSISTENT WITH PREVIOUSLY REPORTED/CALLED VALUE...[facility]    Dg Chest 2 View  Result Date: 11/05/2016 CLINICAL DATA:  Increased shortness of breath for the past 2 weeks with increased symptoms today. No  chest pain or cough. The patient is on home oxygen. EXAM: CHEST  2 VIEW COMPARISON:  PA and lateral chest x-ray of July 11, 2016 FINDINGS: There is new volume loss on the right consistent with a  moderate-sized pleural effusion occupying approximately 1/4 to 1/3 of the pleural space volume. The interstitial markings of both lungs are increased. There is a trace of pleural fluid blunting the left costophrenic angles. The cardiac silhouette is enlarged. The pulmonary vascularity is engorged. There is calcification in the wall of the aortic arch. There are multiple old rib deformities bilaterally. The thoracic vertebral bodies are grossly normal in height. IMPRESSION: CHF superimposed upon COPD. New moderate-sized right pleural effusion. Small left pleural effusion. Pulmonary interstitial edema. Thoracic aortic atherosclerosis. Electronically Signed   By: David  Martinique M.D.   On: 11/05/2016 16:48    Review of Systems  Constitutional: Positive for malaise/fatigue.  HENT: Positive for congestion.   Eyes: Negative.   Respiratory: Positive for shortness of breath.   Cardiovascular: Positive for orthopnea, leg swelling and PND.  Gastrointestinal: Negative.   Genitourinary: Negative.   Musculoskeletal: Negative.   Skin: Negative.   Neurological: Positive for weakness.  Endo/Heme/Allergies: Negative.   Psychiatric/Behavioral: Negative.    Blood pressure (!) 155/54, pulse (!) 51, temperature 98.6 F (37 C), temperature source Oral, resp. rate 19, height 5' 3"  (1.6 m), weight 77.6 kg (171 lb 1.6 oz), SpO2 95 %. Physical Exam  Nursing note and vitals reviewed. Constitutional: She is oriented to person, place, and time. She appears well-developed and well-nourished.  HENT:  Head: Normocephalic and atraumatic.  Eyes: Conjunctivae and EOM are normal. Pupils are equal, round, and reactive to light.  Neck: Normal range of motion. Neck supple.  Cardiovascular: Normal rate and regular rhythm.  Exam reveals  gallop.   Murmur heard. Respiratory: She has wheezes. She has rales.  Diminished breath sounds diffusely  GI: Soft. Bowel sounds are normal.  Musculoskeletal: Normal range of motion. She exhibits edema.  Neurological: She is alert and oriented to person, place, and time. She has normal reflexes.  Skin: Skin is warm and dry.  Psychiatric: She has a normal mood and affect.    Assessment/Plan: Congestive heart failure systolic dysfunction Cardiomyopathy systolic COPD severe Hypoxemia on home O2 2 L Bilateral leg edema Atrial fibrillation chronic Bradycardia Obstructive sleep apnea Former smoker Chronic renal insufficiency Noncompliance . PLAN Agree with admission to telemetry Rule out for myocardial infarction by troponins and EKG Continue supplemental oxygen chronically Agree with inhalers for COPD dyspnea IV diuretic therapy for significant leg edema Continue Peridex anticoagulation for atrial fibrillation Rate control with metoprolol and diltiazem digoxin Follow-up with nephrology for renal insufficiency Continue statin therapy for hyperlipidemia Agree with hypertension control with metoprolol and diltiazem lisinopril Continue heart failure therapy with lisinopril metoprolol diuretics and digoxin  Tenasia Aull D Myrlene Riera 11/06/2016, 1:28 PM

## 2016-11-06 NOTE — Progress Notes (Signed)
*  PRELIMINARY RESULTS* Echocardiogram 2D Echocardiogram has been performed.  Elaine Decker 11/06/2016, 12:05 PM

## 2016-11-06 NOTE — Discharge Instructions (Signed)
Heart Failure Clinic appointment on November 21, 2016 at 9:30am with [name], [place]. Please call [phone] to reschedule.

## 2016-11-06 NOTE — Progress Notes (Signed)
Pharmacist - Prescriber Communication  Digoxin level 2.6 on admission. Spoke to Dr. Jannifer Franklin - okay to hold dose on 11/06/16, recheck all electrolytes with AM labs, and resume half dose (0.125 mg po daily) on 11/07/16. Cardiology consult pending.  Elaine Decker A. [place], [place].D., BCPS Clinical Pharmacist 11/06/2016 [phone]

## 2016-11-06 NOTE — Progress Notes (Signed)
[place] at [place] NAME: Elaine Decker    MR#:  [id number]  DATE OF BIRTH:  [birth year]  SUBJECTIVE:   She here due to shortness of breath, worsening lower extremity edema noted to be in congestive heart failure. Responding well to IV diuresis. Heart rate seems to be on the lower side. No chest pain, nausea, vomiting or any other symptoms.  REVIEW OF SYSTEMS:    Review of Systems  Constitutional: Negative for chills and fever.  HENT: Negative for congestion and tinnitus.   Eyes: Negative for blurred vision and double vision.  Respiratory: Positive for shortness of breath. Negative for cough and wheezing.   Cardiovascular: Negative for chest pain, orthopnea and PND.  Gastrointestinal: Negative for abdominal pain, diarrhea, nausea and vomiting.  Genitourinary: Negative for dysuria and hematuria.  Neurological: Negative for dizziness, sensory change and focal weakness.  All other systems reviewed and are negative.   Nutrition: Heart Healthy Tolerating Diet: Yes Tolerating PT: Await Eval.      DRUG ALLERGIES:  No Known Allergies  VITALS:  Blood pressure (!) 155/54, pulse (!) 51, temperature 98.6 F (37 C), temperature source Oral, resp. rate 19, height 5\' 3"  (1.6 m), weight 77.6 kg (171 lb 1.6 oz), SpO2 95 %.  PHYSICAL EXAMINATION:   Physical Exam  GENERAL:  70 y.o.-year-old patient lying in the bed in no acute distress.  EYES: Pupils equal, round, reactive to light and accommodation. No scleral icterus. Extraocular muscles intact.  HEENT: Head atraumatic, normocephalic. Oropharynx and nasopharynx clear.  NECK:  Supple, no jugular venous distention. No thyroid enlargement, no tenderness.  LUNGS: Normal breath sounds bilaterally, no wheezing, bibasilar rales, No rhonchi. No use of accessory muscles of respiration.  CARDIOVASCULAR: S1, S2 normal. No murmurs, rubs, or gallops.  ABDOMEN: Soft, nontender, nondistended. Bowel sounds  present. No organomegaly or mass.  EXTREMITIES: No cyanosis, clubbing, +1-2 edema b/l.  Signs of chronic venous stasis b/l.  NEUROLOGIC: Cranial nerves II through XII are intact. No focal Motor or sensory deficits b/l.   PSYCHIATRIC: The patient is alert and oriented x 3.  SKIN: No obvious rash, lesion, or ulcer.    LABORATORY PANEL:   CBC  Recent Labs Lab 11/05/16 1536  WBC 8.4  HGB 11.7*  HCT 36.1  PLT 285   ------------------------------------------------------------------------------------------------------------------  Chemistries   Recent Labs Lab 11/06/16 0234  NA 141  K 3.6  CL 97*  CO2 41*  GLUCOSE 139*  BUN 17  CREATININE 0.78  CALCIUM 8.7*  MG 1.7   ------------------------------------------------------------------------------------------------------------------  Cardiac Enzymes  Recent Labs Lab 11/06/16 0908  TROPONINI 0.04*   ------------------------------------------------------------------------------------------------------------------  RADIOLOGY:  Dg Chest 2 View  Result Date: 11/05/2016 CLINICAL DATA:  Increased shortness of breath for the past 2 weeks with increased symptoms today. No chest pain or cough. The patient is on home oxygen. EXAM: CHEST  2 VIEW COMPARISON:  PA and lateral chest x-ray of July 11, 2016 FINDINGS: There is new volume loss on the right consistent with a moderate-sized pleural effusion occupying approximately 1/4 to 1/3 of the pleural space volume. The interstitial markings of both lungs are increased. There is a trace of pleural fluid blunting the left costophrenic angles. The cardiac silhouette is enlarged. The pulmonary vascularity is engorged. There is calcification in the wall of the aortic arch. There are multiple old rib deformities bilaterally. The thoracic vertebral bodies are grossly normal in height. IMPRESSION: CHF superimposed upon COPD. New moderate-sized right pleural effusion.  Small left pleural effusion.  Pulmonary interstitial edema. Thoracic aortic atherosclerosis. Electronically Signed   By: David  Martinique M.D.   On: 11/05/2016 16:48     ASSESSMENT AND PLAN:   A 70 year old female with past medical history of hypertension, hyperlipidemia, COPD, CHF, chronic kidney disease stage III, atrial fibrillation who presented to the hospital due to shortness of breath.   1. Acute on chronic congestive heart failure-this is a cause of patient's shortness of breath. -Patient is noncompliant with her Lasix. Daily diuresis with IV Lasix, follow I's and O's and daily weights. -Continue metoprolol, lisinopril.  2. History of chronic atrial fibrillation-rate controlled. Patient is actually on the bradycardic side. Next-dig level is elevated. Hold digoxin, Cardizem. Continue low-dose metoprolol. -Continue Pradaxa.  3. COPD-no acute exacerbation-continue Dulera, Singulair, Spiriva  4. Essential hypertension-continue Cipro, metoprolol.  5. Hyperlipidemia-continue atorvastatin.    All the records are reviewed and case discussed with Care Management/Social Worker. Management plans discussed with the patient, family and they are in agreement.  CODE STATUS: Full code  DVT Prophylaxis: Pradaxa  TOTAL TIME TAKING CARE OF THIS PATIENT: 30 minutes.   POSSIBLE D/C IN 1-2 DAYS, DEPENDING ON CLINICAL CONDITION.   Henreitta Leber M.D on 11/06/2016 at 3:06 PM  Between 7am to 6pm - Pager - [phone]  After 6pm go to [email or web address] - Proofreader  [facility]  Office  [phone]  CC: Primary care physician; Glendon Axe, MD

## 2016-11-06 NOTE — Care Management (Signed)
Patient admitted with congestive heart failure.  She acknowledges she was not taking her lasix like she should have because it is difficult to do that and work full time. She works for the [employer] at [employer].   She plans to retire now and was in the process of signing up for medicare B.  She has chronic oxygen through [facility].  She does not require any assistive device for ambulation.  Able to drive.  Denies issues accessing medical care and paying for medications.  she is interested I a pulmonary rehab program.  Provided her with brochure for [facility].  She at present says she probably does not need home health nurse follow up.  She by admission might not meet home bound criteria because she has "much to do."

## 2016-11-06 NOTE — Progress Notes (Signed)
[facility]-up appointment at [facility] was scheduled for November 21, 2016 at 9:30am. Of note, she cancelled her appointment yesterday (11/05/16).

## 2016-11-07 LAB — BASIC METABOLIC PANEL
ANION GAP: 5 (ref 5–15)
BUN: 15 mg/dL (ref 6–20)
CHLORIDE: 93 mmol/L — AB (ref 101–111)
CO2: 47 mmol/L — AB (ref 22–32)
Calcium: 8.8 mg/dL — ABNORMAL LOW (ref 8.9–10.3)
Creatinine, Ser: 0.71 mg/dL (ref 0.44–1.00)
GFR calc Af Amer: >60 mL/min (ref >60)
GLUCOSE: 112 mg/dL — AB (ref 65–99)
POTASSIUM: 3.5 mmol/L (ref 3.5–5.1)
Sodium: 145 mmol/L (ref 135–145)

## 2016-11-07 LAB — MAGNESIUM: Magnesium: 1.5 mg/dL — ABNORMAL LOW (ref 1.7–2.4)

## 2016-11-07 MED ORDER — MAGNESIUM SULFATE 4 GM/100ML IV SOLN
4.0000 g | Freq: Once | INTRAVENOUS | Status: AC
  Administered 2016-11-07: 4 g via INTRAVENOUS
  Filled 2016-11-07: qty 100

## 2016-11-07 NOTE — Progress Notes (Addendum)
Patient's HR has been in the low 60's most of the night. This morning HR is sustaining in the 60-80's and up to the 130's afib/aflutter with RVR (per central tele) when up to the [facility]. MD diamond made aware, a new magnesium level was added on to prior blood collection by MD Marcille Blanco and metoprolol was given this morning, no c/o of chest pain or any discomfort by pt, will continue to monitor.

## 2016-11-07 NOTE — Progress Notes (Addendum)
[place] at [place] NAME: Elaine Decker    MR#:  [id number]  DATE OF BIRTH:  Mar 11, 1946  SUBJECTIVE:   She here due to shortness of breath, worsening lower extremity edema noted to be in congestive heart failure. Responding well to IV diuresis.  HR stable.  No further bradycardia.    REVIEW OF SYSTEMS:    Review of Systems  Constitutional: Negative for chills and fever.  HENT: Negative for congestion and tinnitus.   Eyes: Negative for blurred vision and double vision.  Respiratory: Positive for shortness of breath. Negative for cough and wheezing.   Cardiovascular: Negative for chest pain, orthopnea and PND.  Gastrointestinal: Negative for abdominal pain, diarrhea, nausea and vomiting.  Genitourinary: Negative for dysuria and hematuria.  Neurological: Negative for dizziness, sensory change and focal weakness.  All other systems reviewed and are negative.   Nutrition: Heart Healthy Tolerating Diet: Yes Tolerating PT: Ambulatory     DRUG ALLERGIES:  No Known Allergies  VITALS:  Blood pressure (!) 161/71, pulse 67, temperature 97.8 F (36.6 C), temperature source Oral, resp. rate 18, height 5\' 3"  (1.6 m), weight 77.6 kg (171 lb 1.6 oz), SpO2 94 %.  PHYSICAL EXAMINATION:   Physical Exam  GENERAL:  70 y.o.-year-old patient lying in the bed in no acute distress.  EYES: Pupils equal, round, reactive to light and accommodation. No scleral icterus. Extraocular muscles intact.  HEENT: Head atraumatic, normocephalic. Oropharynx and nasopharynx clear.  NECK:  Supple, no jugular venous distention. No thyroid enlargement, no tenderness.  LUNGS: Normal breath sounds bilaterally, no wheezing, bibasilar rales, No rhonchi. No use of accessory muscles of respiration.  CARDIOVASCULAR: S1, S2 normal. No murmurs, rubs, or gallops.  ABDOMEN: Soft, nontender, nondistended. Bowel sounds present. No organomegaly or mass.  EXTREMITIES: No cyanosis,  clubbing, +1-2 edema b/l.  Signs of chronic venous stasis b/l.  NEUROLOGIC: Cranial nerves II through XII are intact. No focal Motor or sensory deficits b/l.   PSYCHIATRIC: The patient is alert and oriented x 3.  SKIN: No obvious rash, lesion, or ulcer.    LABORATORY PANEL:   CBC  Recent Labs Lab 11/05/16 1536  WBC 8.4  HGB 11.7*  HCT 36.1  PLT 285   ------------------------------------------------------------------------------------------------------------------  Chemistries   Recent Labs Lab 11/07/16 0439 11/07/16 0605  NA 145  --   K 3.5  --   CL 93*  --   CO2 47*  --   GLUCOSE 112*  --   BUN 15  --   CREATININE 0.71  --   CALCIUM 8.8*  --   MG  --  1.5*   ------------------------------------------------------------------------------------------------------------------  Cardiac Enzymes  Recent Labs Lab 11/06/16 0908  TROPONINI 0.04*   ------------------------------------------------------------------------------------------------------------------  RADIOLOGY:  Dg Chest 2 View  Result Date: 11/05/2016 CLINICAL DATA:  Increased shortness of breath for the past 2 weeks with increased symptoms today. No chest pain or cough. The patient is on home oxygen. EXAM: CHEST  2 VIEW COMPARISON:  PA and lateral chest x-ray of July 11, 2016 FINDINGS: There is new volume loss on the right consistent with a moderate-sized pleural effusion occupying approximately 1/4 to 1/3 of the pleural space volume. The interstitial markings of both lungs are increased. There is a trace of pleural fluid blunting the left costophrenic angles. The cardiac silhouette is enlarged. The pulmonary vascularity is engorged. There is calcification in the wall of the aortic arch. There are multiple old rib deformities bilaterally. The thoracic  vertebral bodies are grossly normal in height. IMPRESSION: CHF superimposed upon COPD. New moderate-sized right pleural effusion. Small left pleural effusion.  Pulmonary interstitial edema. Thoracic aortic atherosclerosis. Electronically Signed   By: David  Martinique M.D.   On: 11/05/2016 16:48     ASSESSMENT AND PLAN:   A 70 year old female with past medical history of hypertension, hyperlipidemia, COPD, CHF, chronic kidney disease stage III, atrial fibrillation who presented to the hospital due to shortness of breath.   1. Acute on chronic congestive heart failure-this is the cause of patient's shortness of breath. -Patient is noncompliant with her Lasix. Daily diuresis with IV Lasix and about 3 L (-) since admission.  - cont. To follow I's and O's and daily weights.  -Continue metoprolol, lisinopril.  2. History of chronic atrial fibrillation-rate controlled. Bradycardia improved.  -dig level is elevated yesterday and will repeat in a.m.  Cont. to Hold digoxin, Cardizem. Continue low-dose metoprolol. -Continue Pradaxa.  3. COPD-no acute exacerbation-continue Dulera, Singulair, Spiriva  4. Essential hypertension-continue Lisinopril, metoprolol.  5. Hyperlipidemia-continue atorvastatin.  6. Hypomagnesemia - will replace and repeat in a.m.     All the records are reviewed and case discussed with Care Management/Social Worker. Management plans discussed with the patient, family and they are in agreement.  CODE STATUS: Full code  DVT Prophylaxis: Pradaxa  TOTAL TIME TAKING CARE OF THIS PATIENT: 25 minutes.   POSSIBLE D/C IN 1-2 DAYS, DEPENDING ON CLINICAL CONDITION.   Henreitta Leber M.D on 11/07/2016 at 3:02 PM  Between 7am to 6pm - Pager - [phone]  After 6pm go to [email or web address] - Proofreader  [facility]  Office  [phone]  CC: Primary care physician; Glendon Axe, MD

## 2016-11-07 NOTE — Progress Notes (Signed)
MD Hugelmeyer made aware of pt's change in HR rhythm Afib-Afluttter. No new orders received.

## 2016-11-08 LAB — BASIC METABOLIC PANEL
Anion gap: 5 (ref 5–15)
BUN: 15 mg/dL (ref 6–20)
CHLORIDE: 86 mmol/L — AB (ref 101–111)
CO2: 50 mmol/L — AB (ref 22–32)
CREATININE: 0.64 mg/dL (ref 0.44–1.00)
Calcium: 8.3 mg/dL — ABNORMAL LOW (ref 8.9–10.3)
GFR calc Af Amer: >60 mL/min (ref >60)
GFR calc non Af Amer: >60 mL/min (ref >60)
Glucose, Bld: 101 mg/dL — ABNORMAL HIGH (ref 65–99)
Potassium: 3.1 mmol/L — ABNORMAL LOW (ref 3.5–5.1)
SODIUM: 141 mmol/L (ref 135–145)

## 2016-11-08 LAB — MAGNESIUM: MAGNESIUM: 1.9 mg/dL (ref 1.7–2.4)

## 2016-11-08 LAB — DIGOXIN LEVEL: Digoxin Level: 0.9 ng/mL (ref 0.8–2.0)

## 2016-11-08 MED ORDER — DIGOXIN 125 MCG PO TABS
0.1250 mg | ORAL_TABLET | Freq: Every day | ORAL | Status: DC
  Administered 2016-11-08 – 2016-11-09 (×2): 0.125 mg via ORAL
  Filled 2016-11-08 (×2): qty 1

## 2016-11-08 MED ORDER — POTASSIUM CHLORIDE CRYS ER 20 MEQ PO TBCR
40.0000 meq | EXTENDED_RELEASE_TABLET | Freq: Once | ORAL | Status: AC
  Administered 2016-11-08: 40 meq via ORAL
  Filled 2016-11-08: qty 2

## 2016-11-08 MED ORDER — POTASSIUM CHLORIDE CRYS ER 20 MEQ PO TBCR
20.0000 meq | EXTENDED_RELEASE_TABLET | Freq: Two times a day (BID) | ORAL | Status: DC
  Administered 2016-11-08 – 2016-11-09 (×3): 20 meq via ORAL
  Filled 2016-11-08 (×3): qty 1

## 2016-11-08 NOTE — Progress Notes (Signed)
[place] at [place] NAME: Elaine Decker    MR#:  [id number]  DATE OF BIRTH:  04-13-1946  SUBJECTIVE:   She here due to shortness of breath, worsening lower extremity edema noted to be in congestive heart failure. Responding well to IV diuresis.  HR stable.  No further bradycardia.  Dig level improved today.    REVIEW OF SYSTEMS:    Review of Systems  Constitutional: Negative for chills and fever.  HENT: Negative for congestion and tinnitus.   Eyes: Negative for blurred vision and double vision.  Respiratory: Positive for shortness of breath. Negative for cough and wheezing.   Cardiovascular: Negative for chest pain, orthopnea and PND.  Gastrointestinal: Negative for abdominal pain, diarrhea, nausea and vomiting.  Genitourinary: Negative for dysuria and hematuria.  Neurological: Negative for dizziness, sensory change and focal weakness.  All other systems reviewed and are negative.   Nutrition: Heart Healthy Tolerating Diet: Yes Tolerating PT: Ambulatory     DRUG ALLERGIES:  No Known Allergies  VITALS:  Blood pressure (!) 121/55, pulse 69, temperature 97.4 F (36.3 C), temperature source Oral, resp. rate 18, height 5\' 3"  (1.6 m), weight 72.8 kg (160 lb 9.6 oz), SpO2 97 %.  PHYSICAL EXAMINATION:   Physical Exam  GENERAL:  70 y.o.-year-old patient lying in the bed in no acute distress.  EYES: Pupils equal, round, reactive to light and accommodation. No scleral icterus. Extraocular muscles intact.  HEENT: Head atraumatic, normocephalic. Oropharynx and nasopharynx clear.  NECK:  Supple, no jugular venous distention. No thyroid enlargement, no tenderness.  LUNGS: Normal breath sounds bilaterally, no wheezing, bibasilar rales, No rhonchi. No use of accessory muscles of respiration.  CARDIOVASCULAR: S1, S2 normal. No murmurs, rubs, or gallops.  ABDOMEN: Soft, nontender, nondistended. Bowel sounds present. No organomegaly or mass.   EXTREMITIES: No cyanosis, clubbing, +1-2 edema b/l.  Signs of chronic venous stasis b/l.  NEUROLOGIC: Cranial nerves II through XII are intact. No focal Motor or sensory deficits b/l.   PSYCHIATRIC: The patient is alert and oriented x 3.  SKIN: No obvious rash, lesion, or ulcer.    LABORATORY PANEL:   CBC  Recent Labs Lab 11/05/16 1536  WBC 8.4  HGB 11.7*  HCT 36.1  PLT 285   ------------------------------------------------------------------------------------------------------------------  Chemistries   Recent Labs Lab 11/08/16 0442  NA 141  K 3.1*  CL 86*  CO2 50*  GLUCOSE 101*  BUN 15  CREATININE 0.64  CALCIUM 8.3*  MG 1.9   ------------------------------------------------------------------------------------------------------------------  Cardiac Enzymes  Recent Labs Lab 11/06/16 0908  TROPONINI 0.04*   ------------------------------------------------------------------------------------------------------------------  RADIOLOGY:  No results found.   ASSESSMENT AND PLAN:   A 70 year old female with past medical history of hypertension, hyperlipidemia, COPD, CHF, chronic kidney disease stage III, atrial fibrillation who presented to the hospital due to shortness of breath.   1. Acute on chronic congestive heart failure-this is the cause of patient's shortness of breath. -Patient is noncompliant with her Lasix. Cont. diuresis with IV Lasix and about 8 L (-) since admission.  - cont. To follow I's and O's and daily weights.  -Continue metoprolol, lisinopril.  2. History of chronic atrial fibrillation-rate controlled. Bradycardia much improved.  -dig level normalized now.  Will resume low dose Dig. Continue low-dose metoprolol. -Continue Pradaxa.  3. COPD-no acute exacerbation-continue Dulera, Singulair, Spiriva  4. Essential hypertension-continue Lisinopril, metoprolol.  5. Hyperlipidemia-continue atorvastatin.  6. Hypokalemia - due to diuresis.   - will replace orally and monitor.  Possible d/c home tomorrow.   All the records are reviewed and case discussed with Care Management/Social Worker. Management plans discussed with the patient, family and they are in agreement.  CODE STATUS: Full code  DVT Prophylaxis: Pradaxa  TOTAL TIME TAKING CARE OF THIS PATIENT: 25 minutes.   POSSIBLE D/C IN 1-2 DAYS, DEPENDING ON CLINICAL CONDITION.   Henreitta Leber M.D on 11/08/2016 at 1:58 PM  Between 7am to 6pm - Pager - [phone]  After 6pm go to [email or web address] - Proofreader  [facility]  Office  [phone]  CC: Primary care physician; Glendon Axe, MD

## 2016-11-09 LAB — CBC
HCT: 33.9 % — ABNORMAL LOW (ref 35.0–47.0)
Hemoglobin: 11 g/dL — ABNORMAL LOW (ref 12.0–16.0)
MCH: 28.4 pg (ref 26.0–34.0)
MCHC: 32.5 g/dL (ref 32.0–36.0)
MCV: 87.4 fL (ref 80.0–100.0)
Platelets: 212 K/uL (ref 150–440)
RBC: 3.88 MIL/uL (ref 3.80–5.20)
RDW: 16.3 % — ABNORMAL HIGH (ref 11.5–14.5)
WBC: 9.2 K/uL (ref 3.6–11.0)

## 2016-11-09 LAB — BASIC METABOLIC PANEL WITH GFR
Anion gap: 11 (ref 5–15)
BUN: 13 mg/dL (ref 6–20)
CO2: 46 mmol/L — ABNORMAL HIGH (ref 22–32)
Calcium: 8.7 mg/dL — ABNORMAL LOW (ref 8.9–10.3)
Chloride: 85 mmol/L — ABNORMAL LOW (ref 101–111)
Creatinine, Ser: 0.55 mg/dL (ref 0.44–1.00)
GFR calc Af Amer: >60 mL/min (ref >60)
GFR calc non Af Amer: >60 mL/min (ref >60)
Glucose, Bld: 95 mg/dL (ref 65–99)
Potassium: 3.3 mmol/L — ABNORMAL LOW (ref 3.5–5.1)
Sodium: 142 mmol/L (ref 135–145)

## 2016-11-09 MED ORDER — POTASSIUM CHLORIDE CRYS ER 20 MEQ PO TBCR: 20.0000 meq | EXTENDED_RELEASE_TABLET | Freq: Every day | ORAL | 1 refills | Status: DC

## 2016-11-09 MED ORDER — DILTIAZEM HCL ER 120 MG PO CP24: 120.0000 mg | ORAL_CAPSULE | Freq: Every day | ORAL | 1 refills | Status: DC

## 2016-11-09 MED ORDER — METOPROLOL TARTRATE 25 MG PO TABS: 100.0000 mg | ORAL_TABLET | Freq: Two times a day (BID) | ORAL | 1 refills | Status: DC

## 2016-11-09 NOTE — Discharge Summary (Signed)
[facility] NAME: Elaine Decker    MR#:  [id number]  DATE OF BIRTH:  1946/08/23  DATE OF ADMISSION:  11/05/2016 ADMITTING PHYSICIAN: Lytle Butte, MD  DATE OF DISCHARGE: 11/09/2016  PRIMARY CARE PHYSICIAN: Singh,Jasmine, MD    ADMISSION DIAGNOSIS:  Acute on chronic respiratory failure with hypoxia (HCC) [J96.21] Acute on chronic congestive heart failure, unspecified congestive heart failure type ([place]) [I50.9]  DISCHARGE DIAGNOSIS:  Active Problems:   Acute on chronic systolic congestive heart failure ([place])   SECONDARY DIAGNOSIS:   Past Medical History:  Diagnosis Date  . A-fib ([place])   . Asthma   . CHF (congestive heart failure) ([place])   . CKD (chronic kidney disease)   . COPD (chronic obstructive pulmonary disease) ([place])   . HLD (hyperlipidemia)   . Hypertension     HOSPITAL COURSE:   70 year old female with past medical history of hypertension, hyperlipidemia, COPD, CHF, chronic kidney disease stage III, atrial fibrillation who presented to the hospital due to shortness of breath.  1. Acute on chronic congestive heart failure-this was the cause of patient's shortness of breath. -Patient was noncompliant with her Lasix at home prior to admission. Patient was admitted to the hospital and started on IV diuretics. She responded significantly well to it. She is currently about 12 L negative since admission. Her lower extremity edema and shortness of breath have significantly improved. -She is being discharged on oral Lasix along with her metoprolol and lisinopril.  2. History of chronic atrial fibrillation- patient's rate remained controlled. She did have periods of bradycardia. Her digoxin level was elevated. Her digoxin was held and the level has normalized now.  -at present patient is being discharged on lower dose of digoxin, metoprolol 25 mg twice a day, and a lower dose of Cardizem CD. She will continue Pradaxa for  long-term anticoagulation.  3. COPD-no acute exacerbation-no acute exacerbation while in the hospital.  -continue Dulera, Singulair, Spiriva   4. Essential hypertension-she will continue Lisinopril, metoprolol.  5. Hyperlipidemia-she will continue atorvastatin.  6. Hypokalemia - secondary to IV diuresis, and patient is being discharged on oral potassium supplements.  DISCHARGE CONDITIONS:   Stable  CONSULTS OBTAINED:  Treatment Team:  Lytle Butte, MD Yolonda Kida, MD  DRUG ALLERGIES:  No Known Allergies  DISCHARGE MEDICATIONS:     Medication List    TAKE these medications   acetaminophen 500 MG tablet Commonly known as:  TYLENOL Take 500-1,000 mg by mouth daily.   albuterol 108 (90 Base) MCG/ACT inhaler Commonly known as:  PROVENTIL HFA;VENTOLIN HFA Inhale 2 puffs into the lungs every 4 (four) hours as needed for wheezing or shortness of breath.   atorvastatin 20 MG tablet Commonly known as:  LIPITOR Take 20 mg by mouth daily.   cholecalciferol 1000 units tablet Commonly known as:  VITAMIN D Take 1,000 Units by mouth daily.   dabigatran 150 MG Caps capsule Commonly known as:  PRADAXA Take 150 mg by mouth 2 (two) times daily.   digoxin 0.25 MG tablet Commonly known as:  LANOXIN Take 1 tablet (0.25 mg total) by mouth daily.   diltiazem 120 MG 24 hr capsule Commonly known as:  DILACOR XR Take 1 capsule (120 mg total) by mouth daily. What changed:  medication strength  how much to take   Fluticasone-Salmeterol 250-50 MCG/DOSE Aepb Commonly known as:  ADVAIR Inhale 1 puff into the lungs 2 (two) times daily.   furosemide 40 MG  tablet Commonly known as:  LASIX Take 1 tablet (40 mg total) by mouth daily.   ipratropium-albuterol 0.5-2.5 (3) MG/3ML Soln Commonly known as:  DUONEB Take 3 mLs by nebulization every 6 (six) hours. What changed:  when to take this  reasons to take this   lisinopril 5 MG tablet Commonly known as:   PRINIVIL,ZESTRIL TAKE 1 TABLET(5 MG) BY MOUTH DAILY   metoprolol tartrate 25 MG tablet Commonly known as:  LOPRESSOR Take 4 tablets (100 mg total) by mouth 2 (two) times daily. What changed:  medication strength   montelukast 10 MG tablet Commonly known as:  SINGULAIR Take 10 mg by mouth at bedtime.   multivitamin tablet Take 1 tablet by mouth daily.   potassium chloride SA 20 MEQ tablet Commonly known as:  K-DUR,KLOR-CON Take 1 tablet (20 mEq total) by mouth daily.   tiotropium 18 MCG inhalation capsule Commonly known as:  SPIRIVA Place 18 mcg into inhaler and inhale daily.         DISCHARGE INSTRUCTIONS:   DIET:  Cardiac diet  DISCHARGE CONDITION:  Stable  ACTIVITY:  Activity as tolerated  OXYGEN:  Home Oxygen: No.   Oxygen Delivery: room air  DISCHARGE LOCATION:  home   If you experience worsening of your admission symptoms, develop shortness of breath, life threatening emergency, suicidal or homicidal thoughts you must seek medical attention immediately by calling 911 or calling your MD immediately  if symptoms less severe.  You Must read complete instructions/literature along with all the possible adverse reactions/side effects for all the Medicines you take and that have been prescribed to you. Take any new Medicines after you have completely understood and accpet all the possible adverse reactions/side effects.   Please note  You were cared for by a hospitalist during your hospital stay. If you have any questions about your discharge medications or the care you received while you were in the hospital after you are discharged, you can call the unit and asked to speak with the hospitalist on call if the hospitalist that took care of you is not available. Once you are discharged, your primary care physician will handle any further medical issues. Please note that NO REFILLS for any discharge medications will be authorized once you are discharged, as it is  imperative that you return to your primary care physician (or establish a relationship with a primary care physician if you do not have one) for your aftercare needs so that they can reassess your need for medications and monitor your lab values.     Today   Shortness of breath much improved. Lower extremity edema also much improved. No other acute events overnight.  VITAL SIGNS:  Blood pressure (!) 146/71, pulse 69, temperature 97.5 F (36.4 C), temperature source Oral, resp. rate 18, height 5\' 3"  (1.6 m), weight 72.8 kg (160 lb 9.6 oz), SpO2 98 %.  I/O:   Intake/Output Summary (Last 24 hours) at 11/09/16 1342 Last data filed at 11/09/16 1004  Gross per 24 hour  Intake              243 ml  Output             3650 ml  Net            -3407 ml    PHYSICAL EXAMINATION:   GENERAL:  70 y.o.-year-old patient lying in the bed in no acute distress.  EYES: Pupils equal, round, reactive to light and accommodation. No scleral icterus. Extraocular muscles  intact.  HEENT: Head atraumatic, normocephalic. Oropharynx and nasopharynx clear.  NECK:  Supple, no jugular venous distention. No thyroid enlargement, no tenderness.  LUNGS: Normal breath sounds bilaterally, no wheezing, rales, No rhonchi. No use of accessory muscles of respiration.  CARDIOVASCULAR: S1, S2 normal. No murmurs, rubs, or gallops.  ABDOMEN: Soft, nontender, nondistended. Bowel sounds present. No organomegaly or mass.  EXTREMITIES: No cyanosis, clubbing, +1 edema b/l.  Signs of chronic venous stasis b/l.  NEUROLOGIC: Cranial nerves II through XII are intact. No focal Motor or sensory deficits b/l.   PSYCHIATRIC: The patient is alert and oriented x 3.  SKIN: No obvious rash, lesion, or ulcer.    DATA REVIEW:   CBC  Recent Labs Lab 11/09/16 0447  WBC 9.2  HGB 11.0*  HCT 33.9*  PLT 212    Chemistries   Recent Labs Lab 11/08/16 0442 11/09/16 0447  NA 141 142  K 3.1* 3.3*  CL 86* 85*  CO2 50* 46*  GLUCOSE 101*  95  BUN 15 13  CREATININE 0.64 0.55  CALCIUM 8.3* 8.7*  MG 1.9  --     Cardiac Enzymes  Recent Labs Lab 11/06/16 0908  TROPONINI 0.04*    RADIOLOGY:  No results found.    Management plans discussed with the patient, family and they are in agreement.  CODE STATUS:     Code Status Orders        Start     Ordered   11/05/16 1801  Full code  Continuous     11/05/16 1800    Code Status History    Date Active Date Inactive Code Status Order ID Comments User Context   07/02/2016  3:12 PM 07/06/2016  8:27 PM Full Code [id number]  Allyne Gee, MD Inpatient   03/19/2016  2:41 AM 03/26/2016  3:56 PM Full Code [id number]  Saundra Shelling, MD Inpatient   01/19/2016  9:13 PM 01/24/2016  7:21 PM Full Code [id number]  Lance Coon, MD Inpatient   11/28/2015 11:41 AM 11/30/2015  5:51 PM Full Code [id number]  Loletha Grayer, MD ED      TOTAL TIME TAKING CARE OF THIS PATIENT: 40 minutes.    Henreitta Leber M.D on 11/09/2016 at 1:42 PM  Between 7am to 6pm - Pager - [phone]  After 6pm go to [email or web address] - Proofreader  [facility]  Office  [phone]  CC: Primary care physician; Glendon Axe, MD

## 2016-11-21 ENCOUNTER — Encounter: Payer: Self-pay | Admitting: Family

## 2016-11-21 ENCOUNTER — Ambulatory Visit: Payer: BLUE CROSS/BLUE SHIELD | Attending: Family | Admitting: Family

## 2016-11-21 VITALS — BP 154/67 | HR 63 | Resp 18 | Ht 63.0 in | Wt 162.0 lb

## 2016-11-21 DIAGNOSIS — J449 Chronic obstructive pulmonary disease, unspecified: Secondary | ICD-10-CM | POA: Insufficient documentation

## 2016-11-21 DIAGNOSIS — Z87891 Personal history of nicotine dependence: Secondary | ICD-10-CM | POA: Insufficient documentation

## 2016-11-21 DIAGNOSIS — I5022 Chronic systolic (congestive) heart failure: Secondary | ICD-10-CM

## 2016-11-21 DIAGNOSIS — G4733 Obstructive sleep apnea (adult) (pediatric): Secondary | ICD-10-CM | POA: Diagnosis not present

## 2016-11-21 DIAGNOSIS — Z7901 Long term (current) use of anticoagulants: Secondary | ICD-10-CM | POA: Insufficient documentation

## 2016-11-21 DIAGNOSIS — I480 Paroxysmal atrial fibrillation: Secondary | ICD-10-CM

## 2016-11-21 DIAGNOSIS — R001 Bradycardia, unspecified: Secondary | ICD-10-CM | POA: Insufficient documentation

## 2016-11-21 DIAGNOSIS — I130 Hypertensive heart and chronic kidney disease with heart failure and stage 1 through stage 4 chronic kidney disease, or unspecified chronic kidney disease: Secondary | ICD-10-CM | POA: Diagnosis present

## 2016-11-21 DIAGNOSIS — J9621 Acute and chronic respiratory failure with hypoxia: Secondary | ICD-10-CM | POA: Insufficient documentation

## 2016-11-21 DIAGNOSIS — I272 Pulmonary hypertension, unspecified: Secondary | ICD-10-CM | POA: Insufficient documentation

## 2016-11-21 DIAGNOSIS — I10 Essential (primary) hypertension: Secondary | ICD-10-CM

## 2016-11-21 DIAGNOSIS — N189 Chronic kidney disease, unspecified: Secondary | ICD-10-CM | POA: Diagnosis not present

## 2016-11-21 DIAGNOSIS — E785 Hyperlipidemia, unspecified: Secondary | ICD-10-CM | POA: Insufficient documentation

## 2016-11-21 DIAGNOSIS — M7989 Other specified soft tissue disorders: Secondary | ICD-10-CM | POA: Diagnosis not present

## 2016-11-21 DIAGNOSIS — I4891 Unspecified atrial fibrillation: Secondary | ICD-10-CM | POA: Diagnosis not present

## 2016-11-21 NOTE — Patient Instructions (Addendum)
Continue weighing daily and call for an overnight weight gain of > 2 pounds or a weekly weight gain of >5 pounds.  Double furosemide & potassium for the next 2 days.

## 2016-11-21 NOTE — Progress Notes (Signed)
Patient ID: Elaine Decker, female    DOB: [birth year], 70 y.o.   MRN: [id number]  HPI Ms Wittke is a 70 y/o female with a history of HTN, hyperlipidemia, COPD, obstructive sleep apnea, CKD, asthma, atrial fibrillation, prior tobacco use and chronic heart failure.  Last echo was done 11/06/16 and showed an EF of 60-65% with mild AR, moderate MR and severe TR. Mild to moderate pulmonary HTN was also present. EF has improved greatly from echo done 01/21/16 which showed an EF of 25-30%.  Was last admitted on 11/05/16 after being seen at outside ED on that same day with acute exacerbation of heart failure. She had not been taking her diuretic as prescribed. Was treated with IV diuretics and lost approximately 12L of fluid. Prior admission was on 07/02/16 for acute on chronic respiratory failure with hypoxemia secondary to COPD and pneumonia. Was treated with IV steroids and antibiotics. Was discharged after 4 days.   She presents today for a follow-up visit with fatigue and shortness of breath with exertion. Also endorses some swelling in her legs as well. She continues to weigh herself and has noticed a gradual weight gain since her hospital discharge. Admits to not taking her furosemide yesterday nor has she taken it today. She finds it difficult to take at work because she's then "always running to the bathroom". Going to retire at the end of this month so remembering to take it daily shouldn't then be a problem.   Past Medical History:  Diagnosis Date  . A-fib ([place])   . Asthma   . CHF (congestive heart failure) ([place])   . CKD (chronic kidney disease)   . COPD (chronic obstructive pulmonary disease) ([place])   . HLD (hyperlipidemia)   . Hypertension    Past Surgical History:  Procedure Laterality Date  . FLEXIBLE BRONCHOSCOPY N/A 02/01/2016   Procedure: FLEXIBLE BRONCHOSCOPY;  Surgeon: Allyne Gee, MD;  Location: [facility] ORS;  Service: Pulmonary;  Laterality: N/A;  . Surgery on the neck     Family  History  Problem Relation Age of Onset  . CAD Mother   . Lung cancer Father   . Arrhythmia Brother    Social History  Substance Use Topics  . Smoking status: Former Smoker    Packs/day: 2.00    Years: 50.00    Quit date: 11/27/2015  . Smokeless tobacco: Never Used  . Alcohol use No   No Known Allergies  Prior to Admission medications   Medication Sig Start Date End Date Taking? Authorizing Provider  acetaminophen (TYLENOL) 500 MG tablet Take 500-1,000 mg by mouth daily.   Yes Historical Provider, MD  albuterol (PROVENTIL HFA;VENTOLIN HFA) 108 (90 Base) MCG/ACT inhaler Inhale 2 puffs into the lungs every 4 (four) hours as needed for wheezing or shortness of breath.   Yes Historical Provider, MD  atorvastatin (LIPITOR) 20 MG tablet Take 20 mg by mouth daily.   Yes Historical Provider, MD  cholecalciferol (VITAMIN D) 1000 UNITS tablet Take 1,000 Units by mouth daily.   Yes Historical Provider, MD  dabigatran (PRADAXA) 150 MG CAPS capsule Take 150 mg by mouth 2 (two) times daily.   Yes Historical Provider, MD  digoxin (LANOXIN) 0.25 MG tablet Take 1 tablet (0.25 mg total) by mouth daily. 06/26/16  Yes Alisa Graff, FNP  diltiazem (DILACOR XR) 120 MG 24 hr capsule Take 1 capsule (120 mg total) by mouth daily. 11/09/16  Yes Henreitta Leber, MD  Fluticasone-Salmeterol (ADVAIR) 250-50 MCG/DOSE AEPB  Inhale 1 puff into the lungs 2 (two) times daily.   Yes Historical Provider, MD  furosemide (LASIX) 40 MG tablet Take 1 tablet (40 mg total) by mouth daily. 03/26/16  Yes Gladstone Lighter, MD  ipratropium-albuterol (DUONEB) 0.5-2.5 (3) MG/3ML SOLN Take 3 mLs by nebulization every 6 (six) hours. Patient taking differently: Take 3 mLs by nebulization every 6 (six) hours as needed (for wheezing/shortness of breath).  11/30/15  Yes Fritzi Mandes, MD  lisinopril (PRINIVIL,ZESTRIL) 5 MG tablet TAKE 1 TABLET(5 MG) BY MOUTH DAILY 07/25/16  Yes Alisa Graff, FNP  metoprolol tartrate (LOPRESSOR) 25 MG tablet  Take 4 tablets (100 mg total) by mouth 2 (two) times daily. 11/09/16  Yes Henreitta Leber, MD  montelukast (SINGULAIR) 10 MG tablet Take 10 mg by mouth at bedtime.    Yes Historical Provider, MD  Multiple Vitamin (MULTIVITAMIN) tablet Take 1 tablet by mouth daily.   Yes Historical Provider, MD  potassium chloride SA (K-DUR,KLOR-CON) 20 MEQ tablet Take 1 tablet (20 mEq total) by mouth daily. 11/09/16  Yes Henreitta Leber, MD  tiotropium (SPIRIVA) 18 MCG inhalation capsule Place 18 mcg into inhaler and inhale daily.   Yes Historical Provider, MD     Review of Systems  Constitutional: Positive for fatigue. Negative for appetite change.  HENT: Positive for rhinorrhea. Negative for congestion and sore throat.   Eyes: Negative.   Respiratory: Positive for shortness of breath and wheezing. Negative for cough and chest tightness.   Cardiovascular: Positive for leg swelling. Negative for chest pain and palpitations.  Gastrointestinal: Negative for abdominal distention and abdominal pain.  Endocrine: Negative.   Genitourinary: Negative.   Musculoskeletal: Negative for back pain and neck pain.  Skin: Negative.   Allergic/Immunologic: Negative.   Neurological: Negative for dizziness and light-headedness.  Hematological: Negative for adenopathy. Does not bruise/bleed easily.  Psychiatric/Behavioral: Positive for sleep disturbance (wearing CPAP and oxygen at 2L). Negative for dysphoric mood. The patient is not nervous/anxious.    Vitals:   11/21/16 0902  BP: (!) 154/67  Pulse: 63  Resp: 18  SpO2: (!) 89%  Weight: 162 lb (73.5 kg)  Height: 5\' 3"  (1.6 m)   Wt Readings from Last 3 Encounters:  11/21/16 162 lb (73.5 kg)  11/08/16 160 lb 9.6 oz (72.8 kg)  08/06/16 145 lb (65.8 kg)   Lab Results  Component Value Date   CREATININE 0.55 11/09/2016   CREATININE 0.64 11/08/2016   CREATININE 0.71 11/07/2016    Physical Exam  Constitutional: She is oriented to person, place, and time. She appears  well-developed and well-nourished.  HENT:  Head: Normocephalic and atraumatic.  Eyes: Conjunctivae are normal. Pupils are equal, round, and reactive to light.  Neck: Normal range of motion. Neck supple. No JVD present.  Cardiovascular: An irregular rhythm present. Bradycardia present.   Pulmonary/Chest: Effort normal. She has no wheezes. She has no rales.  Abdominal: Soft. She exhibits no distension. There is no tenderness.  Musculoskeletal: She exhibits edema (2+ pitting edema in bilateral lower legs). She exhibits no tenderness.  Neurological: She is alert and oriented to person, place, and time.  Skin: Skin is warm and dry.  Psychiatric: She has a normal mood and affect. Her behavior is normal. Thought content normal.  Nursing note and vitals reviewed.  Assessment & Plan:  1: Chronic heart failure with reduced ejection fraction- - NYHA class II - mildly fluid overloaded today - EF has now normalized - didn't take furosemide yesterday nor yet today. Discussed  the importance of taking it every day. She commits to taking it daily as she know what will happen if she doesn't - she is to double her furosemide and potassium for today and tomorrow.  - d/c weight 160 pounds and today she is 162 pounds. Admission weight was 171 pounds - not adding salt but admits to eating saltier foods like popcorn. Encouraged to closely follow a 2000mg  sodium diet - drinking around 50-60 ounces of fluid daily - saw cardiologist ([place]) 09/25/16 and returns to him 01/23/17  2: HTN- - BP looks ok today - saw PCP Candiss Norse) 10/02/16 and returns on 01/03/17  3: Atrial fibrillation- - currently rate controlled at this time - remains on dabigatran, digoxin, diltiazem and metoprolol - was taking 100mg  metoprolol BID (25mg  tablets) as that was what was on the bottle but d/c summary notes that her metoprolol was to be decreased to 25mg  BID due to bradycardia. Advised patient to decrease it to 25mg  BID and monitor  HR at home (she has a pulse oximeter).   4: Obstructive sleep apnea- - wearing oxygen at 2L around the clock - wears CPAP nightly  Return here next week for a recheck and lab work.

## 2016-11-28 ENCOUNTER — Encounter: Payer: Self-pay | Admitting: Family

## 2016-11-28 ENCOUNTER — Ambulatory Visit: Payer: BLUE CROSS/BLUE SHIELD | Attending: Family | Admitting: Family

## 2016-11-28 VITALS — BP 154/75 | HR 64 | Resp 20 | Ht 63.0 in | Wt 160.0 lb

## 2016-11-28 DIAGNOSIS — N189 Chronic kidney disease, unspecified: Secondary | ICD-10-CM | POA: Diagnosis not present

## 2016-11-28 DIAGNOSIS — Z7901 Long term (current) use of anticoagulants: Secondary | ICD-10-CM | POA: Insufficient documentation

## 2016-11-28 DIAGNOSIS — I5022 Chronic systolic (congestive) heart failure: Secondary | ICD-10-CM

## 2016-11-28 DIAGNOSIS — G4733 Obstructive sleep apnea (adult) (pediatric): Secondary | ICD-10-CM | POA: Diagnosis not present

## 2016-11-28 DIAGNOSIS — E785 Hyperlipidemia, unspecified: Secondary | ICD-10-CM | POA: Insufficient documentation

## 2016-11-28 DIAGNOSIS — Z87891 Personal history of nicotine dependence: Secondary | ICD-10-CM | POA: Diagnosis not present

## 2016-11-28 DIAGNOSIS — I4891 Unspecified atrial fibrillation: Secondary | ICD-10-CM | POA: Insufficient documentation

## 2016-11-28 DIAGNOSIS — I10 Essential (primary) hypertension: Secondary | ICD-10-CM

## 2016-11-28 DIAGNOSIS — I272 Pulmonary hypertension, unspecified: Secondary | ICD-10-CM | POA: Diagnosis not present

## 2016-11-28 DIAGNOSIS — I130 Hypertensive heart and chronic kidney disease with heart failure and stage 1 through stage 4 chronic kidney disease, or unspecified chronic kidney disease: Secondary | ICD-10-CM | POA: Insufficient documentation

## 2016-11-28 DIAGNOSIS — J449 Chronic obstructive pulmonary disease, unspecified: Secondary | ICD-10-CM | POA: Insufficient documentation

## 2016-11-28 DIAGNOSIS — I480 Paroxysmal atrial fibrillation: Secondary | ICD-10-CM

## 2016-11-28 LAB — BASIC METABOLIC PANEL
Anion gap: 3 — ABNORMAL LOW (ref 5–15)
BUN: 14 mg/dL (ref 6–20)
CO2: 42 mmol/L — ABNORMAL HIGH (ref 22–32)
CREATININE: 0.62 mg/dL (ref 0.44–1.00)
Calcium: 9.6 mg/dL (ref 8.9–10.3)
Chloride: 95 mmol/L — ABNORMAL LOW (ref 101–111)
GFR calc Af Amer: >60 mL/min (ref >60)
GLUCOSE: 99 mg/dL (ref 65–99)
Potassium: 3.6 mmol/L (ref 3.5–5.1)
SODIUM: 140 mmol/L (ref 135–145)

## 2016-11-28 MED ORDER — TORSEMIDE 20 MG PO TABS
40.0000 mg | ORAL_TABLET | Freq: Every day | ORAL | 3 refills | Status: DC
Start: 2016-11-28 — End: 2017-04-25

## 2016-11-28 NOTE — Progress Notes (Signed)
Patient ID: Elaine Decker, female    DOB: [birth year], 69 y.o.   MRN: [id number]  HPI  Elaine Decker is a 70 y/o female with a history of HTN, hyperlipidemia, COPD, obstructive sleep apnea, CKD, asthma, atrial fibrillation, prior tobacco use and chronic heart failure.  Last echo was done 11/06/16 and showed an EF of 60-65% with mild AR, moderate MR and severe TR. Mild to moderate pulmonary HTN was also present. EF has improved greatly from echo done 01/21/16 which showed an EF of 25-30%.  Was last admitted on 11/05/16 after being seen at outside ED on that same day with acute exacerbation of heart failure. She had not been taking her diuretic as prescribed. Was treated with IV diuretics and lost approximately 12L of fluid. Prior admission was on 07/02/16 for acute on chronic respiratory failure with hypoxemia secondary to COPD and pneumonia. Was treated with IV steroids and antibiotics. Was discharged after 4 days.   She presents today for a follow-up visit with fatigue and shortness of breath with exertion. Continues to have edema in her lower legs. Has been weighing herself and has noticed a couple pound weight loss. Has been taking her furosemide daily and also doubled it up for 2 days but doesn't feel like she is getting the results like she used to. She's also noticed that her HR is getting up in the 130's close to bedtime. It will stay up there for a few minutes and then come back down.   Past Medical History:  Diagnosis Date  . A-fib ([place])   . Asthma   . CHF (congestive heart failure) ([place])   . CKD (chronic kidney disease)   . COPD (chronic obstructive pulmonary disease) ([place])   . HLD (hyperlipidemia)   . Hypertension     Past Surgical History:  Procedure Laterality Date  . FLEXIBLE BRONCHOSCOPY N/A 02/01/2016   Procedure: FLEXIBLE BRONCHOSCOPY;  Surgeon: Allyne Gee, MD;  Location: [facility] ORS;  Service: Pulmonary;  Laterality: N/A;  . Surgery on the neck      Family History  Problem  Relation Age of Onset  . CAD Mother   . Lung cancer Father   . Arrhythmia Brother     Social History  Substance Use Topics  . Smoking status: Former Smoker    Packs/day: 2.00    Years: 50.00    Quit date: 11/27/2015  . Smokeless tobacco: Never Used  . Alcohol use No    No Known Allergies  Prior to Admission medications   Medication Sig Start Date End Date Taking? Authorizing Provider  acetaminophen (TYLENOL) 500 MG tablet Take 500-1,000 mg by mouth daily.    Historical Provider, MD  albuterol (PROVENTIL HFA;VENTOLIN HFA) 108 (90 Base) MCG/ACT inhaler Inhale 2 puffs into the lungs every 4 (four) hours as needed for wheezing or shortness of breath.    Historical Provider, MD  atorvastatin (LIPITOR) 20 MG tablet Take 20 mg by mouth daily.    Historical Provider, MD  cholecalciferol (VITAMIN D) 1000 UNITS tablet Take 1,000 Units by mouth daily.    Historical Provider, MD  dabigatran (PRADAXA) 150 MG CAPS capsule Take 150 mg by mouth 2 (two) times daily.    Historical Provider, MD  digoxin (LANOXIN) 0.25 MG tablet Take 1 tablet (0.25 mg total) by mouth daily. 06/26/16   Alisa Graff, FNP  diltiazem (DILACOR XR) 120 MG 24 hr capsule Take 1 capsule (120 mg total) by mouth daily. 11/09/16   Henreitta Leber,  MD  Fluticasone-Salmeterol (ADVAIR) 250-50 MCG/DOSE AEPB Inhale 1 puff into the lungs 2 (two) times daily.    Historical Provider, MD  furosemide (LASIX) 40 MG tablet Take 1 tablet (40 mg total) by mouth daily. 03/26/16   Gladstone Lighter, MD  ipratropium-albuterol (DUONEB) 0.5-2.5 (3) MG/3ML SOLN Take 3 mLs by nebulization every 6 (six) hours. Patient taking differently: Take 3 mLs by nebulization every 6 (six) hours as needed (for wheezing/shortness of breath).  11/30/15   Fritzi Mandes, MD  lisinopril (PRINIVIL,ZESTRIL) 5 MG tablet TAKE 1 TABLET(5 MG) BY MOUTH DAILY 07/25/16   Alisa Graff, FNP  metoprolol tartrate (LOPRESSOR) 25 MG tablet Take 25 mg by mouth 2 (two) times daily.     Historical Provider, MD  montelukast (SINGULAIR) 10 MG tablet Take 10 mg by mouth at bedtime.     Historical Provider, MD  Multiple Vitamin (MULTIVITAMIN) tablet Take 1 tablet by mouth daily.    Historical Provider, MD  potassium chloride SA (K-DUR,KLOR-CON) 20 MEQ tablet Take 1 tablet (20 mEq total) by mouth daily. 11/09/16   Henreitta Leber, MD  tiotropium (SPIRIVA) 18 MCG inhalation capsule Place 18 mcg into inhaler and inhale daily.    Historical Provider, MD     Review of Systems  Constitutional: Positive for fatigue. Negative for appetite change.  HENT: Negative for congestion, rhinorrhea and sore throat.   Eyes: Negative.   Respiratory: Positive for shortness of breath. Negative for cough, chest tightness and wheezing.   Cardiovascular: Positive for palpitations and leg swelling. Negative for chest pain.  Gastrointestinal: Negative for abdominal distention and abdominal pain.  Endocrine: Negative.   Genitourinary: Negative.   Musculoskeletal: Negative for back pain and neck pain.  Skin: Negative.   Allergic/Immunologic: Negative.   Neurological: Negative for dizziness and light-headedness.  Hematological: Negative for adenopathy. Bruises/bleeds easily.  Psychiatric/Behavioral: Negative for dysphoric mood and sleep disturbance (wearing CPAP along with oxygen). The patient is not nervous/anxious.    Vitals:   11/28/16 0820  BP: (!) 154/75  Pulse: 64  Resp: 20  SpO2: 98%  Weight: 160 lb (72.6 kg)  Height: 5\' 3"  (1.6 m)   Wt Readings from Last 3 Encounters:  11/28/16 160 lb (72.6 kg)  11/21/16 162 lb (73.5 kg)  11/08/16 160 lb 9.6 oz (72.8 kg)   Lab Results  Component Value Date   CREATININE 0.55 11/09/2016   CREATININE 0.64 11/08/2016   CREATININE 0.71 11/07/2016    Physical Exam  Constitutional: She is oriented to person, place, and time. She appears well-developed and well-nourished.  HENT:  Head: Normocephalic and atraumatic.  Eyes: Conjunctivae are normal.  Pupils are equal, round, and reactive to light.  Neck: Normal range of motion. Neck supple. JVD present.  Cardiovascular: Normal rate and regular rhythm.   Pulmonary/Chest: Effort normal. She has no wheezes. She has no rales.  Abdominal: Soft. She exhibits no distension. There is no tenderness.  Musculoskeletal: She exhibits edema (2+ pitting edema in bilateral lower legs to knees). She exhibits no tenderness.  Neurological: She is alert and oriented to person, place, and time.  Skin: Skin is warm and dry.  Psychiatric: She has a normal mood and affect. Her behavior is normal. Thought content normal.  Vitals reviewed.    Assessment & Plan:  1: Chronic heart failure with reduced ejection fraction- - NYHA class II - continues to be mildly fluid overloaded today - EF has now normalized - will stop furosemide and begin torsemide 40mg  daily - will get  a BMP today since she resumed taking her diuretic daily - d/c weight 160 pounds and today she is 160 pounds. Admission weight was 171 pounds - not adding salt but admits to eating saltier foods like popcorn. Encouraged to closely follow a 2000mg  sodium diet - drinking around 50-60 ounces of fluid daily - saw cardiologist ([place]) 09/25/16 and returns to him 01/23/17  2: HTN- - BP looks ok today - saw PCP Candiss Norse) 10/02/16 and returns on 01/03/17  3: Atrial fibrillation- - currently rate controlled at this time - remains on dabigatran, digoxin, diltiazem and metoprolol - since she's having episodes of an elevated HR, will increase her PM metoprolol dose to 50mg . Continue 25mg  in the AM. She has a pulse oximeter so can check her HR at home.   4: Obstructive sleep apnea- - wearing oxygen at 2L around the clock - wears CPAP nightly  Return here in 1 month or sooner for any questions/problems before then.

## 2016-11-28 NOTE — Patient Instructions (Signed)
Continue weighing daily and call for an overnight weight gain of > 2 pounds or a weekly weight gain of >5 pounds.  Increase afternoon metoprolol dose to 50mg  daily. Continue 25mg  dose in the morning.   Stop furosemide and begin torsemide 40mg  daily instead

## 2016-12-10 ENCOUNTER — Encounter: Payer: Self-pay | Admitting: Emergency Medicine

## 2016-12-10 ENCOUNTER — Emergency Department: Payer: Medicare Other

## 2016-12-10 ENCOUNTER — Inpatient Hospital Stay
Admission: EM | Admit: 2016-12-10 | Discharge: 2016-12-13 | DRG: 291 | Disposition: A | Discharge status: Home / Self Care | Payer: Medicare Other | Attending: Internal Medicine | Admitting: Internal Medicine

## 2016-12-10 DIAGNOSIS — N189 Chronic kidney disease, unspecified: Secondary | ICD-10-CM | POA: Diagnosis present

## 2016-12-10 DIAGNOSIS — R778 Other specified abnormalities of plasma proteins: Secondary | ICD-10-CM | POA: Diagnosis present

## 2016-12-10 DIAGNOSIS — I130 Hypertensive heart and chronic kidney disease with heart failure and stage 1 through stage 4 chronic kidney disease, or unspecified chronic kidney disease: Secondary | ICD-10-CM | POA: Diagnosis not present

## 2016-12-10 DIAGNOSIS — I4891 Unspecified atrial fibrillation: Secondary | ICD-10-CM | POA: Diagnosis present

## 2016-12-10 DIAGNOSIS — J9621 Acute and chronic respiratory failure with hypoxia: Secondary | ICD-10-CM | POA: Diagnosis present

## 2016-12-10 DIAGNOSIS — J441 Chronic obstructive pulmonary disease with (acute) exacerbation: Secondary | ICD-10-CM | POA: Diagnosis present

## 2016-12-10 DIAGNOSIS — R7989 Other specified abnormal findings of blood chemistry: Secondary | ICD-10-CM

## 2016-12-10 DIAGNOSIS — Z79899 Other long term (current) drug therapy: Secondary | ICD-10-CM | POA: Diagnosis not present

## 2016-12-10 DIAGNOSIS — Z87891 Personal history of nicotine dependence: Secondary | ICD-10-CM

## 2016-12-10 DIAGNOSIS — Z7951 Long term (current) use of inhaled steroids: Secondary | ICD-10-CM | POA: Diagnosis not present

## 2016-12-10 DIAGNOSIS — I429 Cardiomyopathy, unspecified: Secondary | ICD-10-CM | POA: Diagnosis present

## 2016-12-10 DIAGNOSIS — I4892 Unspecified atrial flutter: Secondary | ICD-10-CM | POA: Diagnosis present

## 2016-12-10 DIAGNOSIS — I5023 Acute on chronic systolic (congestive) heart failure: Secondary | ICD-10-CM | POA: Diagnosis present

## 2016-12-10 DIAGNOSIS — E785 Hyperlipidemia, unspecified: Secondary | ICD-10-CM | POA: Diagnosis present

## 2016-12-10 DIAGNOSIS — I509 Heart failure, unspecified: Secondary | ICD-10-CM | POA: Diagnosis not present

## 2016-12-10 DIAGNOSIS — R739 Hyperglycemia, unspecified: Secondary | ICD-10-CM | POA: Diagnosis present

## 2016-12-10 DIAGNOSIS — Z9981 Dependence on supplemental oxygen: Secondary | ICD-10-CM

## 2016-12-10 DIAGNOSIS — E119 Type 2 diabetes mellitus without complications: Secondary | ICD-10-CM

## 2016-12-10 LAB — CBC WITH DIFFERENTIAL/PLATELET
BASOS ABS: 0 10*3/uL (ref 0–0.1)
BASOS PCT: 0 %
Eosinophils Absolute: 0 10*3/uL (ref 0–0.7)
Eosinophils Relative: 0 %
HEMATOCRIT: 40.2 % (ref 35.0–47.0)
HEMOGLOBIN: 12.2 g/dL (ref 12.0–16.0)
LYMPHS PCT: 14 %
Lymphs Abs: 0.9 10*3/uL — ABNORMAL LOW (ref 1.0–3.6)
MCH: 27.5 pg (ref 26.0–34.0)
MCHC: 30.4 g/dL — AB (ref 32.0–36.0)
MCV: 90.4 fL (ref 80.0–100.0)
MONOS PCT: 9 %
Monocytes Absolute: 0.6 10*3/uL (ref 0.2–0.9)
NEUTROS ABS: 5 10*3/uL (ref 1.4–6.5)
NEUTROS PCT: 77 %
Platelets: 234 10*3/uL (ref 150–440)
RBC: 4.45 MIL/uL (ref 3.80–5.20)
RDW: 18.4 % — ABNORMAL HIGH (ref 11.5–14.5)
WBC: 6.5 10*3/uL (ref 3.6–11.0)

## 2016-12-10 LAB — COMPREHENSIVE METABOLIC PANEL
ALBUMIN: 3.6 g/dL (ref 3.5–5.0)
ALT: 40 U/L (ref 14–54)
AST: 36 U/L (ref 15–41)
Alkaline Phosphatase: 95 U/L (ref 38–126)
Anion gap: 7 (ref 5–15)
BUN: 17 mg/dL (ref 6–20)
CHLORIDE: 95 mmol/L — AB (ref 101–111)
CO2: 36 mmol/L — AB (ref 22–32)
Calcium: 9.2 mg/dL (ref 8.9–10.3)
Creatinine, Ser: 0.95 mg/dL (ref 0.44–1.00)
GFR calc Af Amer: >60 mL/min (ref >60)
Glucose, Bld: 216 mg/dL — ABNORMAL HIGH (ref 65–99)
POTASSIUM: 4.3 mmol/L (ref 3.5–5.1)
Sodium: 138 mmol/L (ref 135–145)
Total Bilirubin: 0.7 mg/dL (ref 0.3–1.2)
Total Protein: 7.4 g/dL (ref 6.5–8.1)

## 2016-12-10 LAB — BRAIN NATRIURETIC PEPTIDE: B Natriuretic Peptide: 1483 pg/mL — ABNORMAL HIGH (ref 0.0–100.0)

## 2016-12-10 LAB — TROPONIN I: TROPONIN I: 0.1 ng/mL — AB (ref <0.03)

## 2016-12-10 LAB — MAGNESIUM: MAGNESIUM: 1.8 mg/dL (ref 1.7–2.4)

## 2016-12-10 MED ORDER — ADULT MULTIVITAMIN W/MINERALS CH
1.0000 | ORAL_TABLET | Freq: Every day | ORAL | Status: DC
  Administered 2016-12-11 – 2016-12-13 (×3): 1 via ORAL
  Filled 2016-12-10 (×3): qty 1

## 2016-12-10 MED ORDER — DABIGATRAN ETEXILATE MESYLATE 150 MG PO CAPS: 150.0000 mg | ORAL_CAPSULE | Freq: Two times a day (BID) | ORAL | Status: DC

## 2016-12-10 MED ORDER — FUROSEMIDE 10 MG/ML IJ SOLN: 40.0000 mg | Freq: Every day | INTRAMUSCULAR | Status: DC

## 2016-12-10 MED ORDER — DIGOXIN 125 MCG PO TABS
0.2500 mg | ORAL_TABLET | Freq: Every day | ORAL | Status: DC
  Administered 2016-12-11 – 2016-12-13 (×3): 0.25 mg via ORAL
  Filled 2016-12-10 (×3): qty 2

## 2016-12-10 MED ORDER — SODIUM CHLORIDE 0.9% FLUSH
3.0000 mL | INTRAVENOUS | Status: DC | PRN
  Administered 2016-12-11 – 2016-12-13 (×2): 3 mL via INTRAVENOUS
  Filled 2016-12-10 (×2): qty 3

## 2016-12-10 MED ORDER — FUROSEMIDE 10 MG/ML IJ SOLN
60.0000 mg | Freq: Once | INTRAMUSCULAR | Status: AC
Start: 2016-12-10 — End: 2016-12-10
  Administered 2016-12-10: 60 mg via INTRAVENOUS
  Filled 2016-12-10: qty 8

## 2016-12-10 MED ORDER — DILTIAZEM HCL 25 MG/5ML IV SOLN: 5.0000 mg | Freq: Once | INTRAVENOUS | Status: DC

## 2016-12-10 MED ORDER — ACETAMINOPHEN 325 MG PO TABS: 650.0000 mg | ORAL_TABLET | ORAL | Status: DC | PRN

## 2016-12-10 MED ORDER — ASPIRIN 81 MG PO CHEW
324.0000 mg | CHEWABLE_TABLET | Freq: Once | ORAL | Status: AC
  Administered 2016-12-10: 324 mg via ORAL
  Filled 2016-12-10: qty 4

## 2016-12-10 MED ORDER — IPRATROPIUM-ALBUTEROL 0.5-2.5 (3) MG/3ML IN SOLN: 3.0000 mL | Freq: Four times a day (QID) | RESPIRATORY_TRACT | Status: DC | PRN

## 2016-12-10 MED ORDER — GUAIFENESIN ER 600 MG PO TB12
600.0000 mg | ORAL_TABLET | Freq: Two times a day (BID) | ORAL | Status: DC
  Administered 2016-12-10 – 2016-12-13 (×6): 600 mg via ORAL
  Filled 2016-12-10 (×6): qty 1

## 2016-12-10 MED ORDER — TIOTROPIUM BROMIDE MONOHYDRATE 18 MCG IN CAPS
18.0000 ug | ORAL_CAPSULE | Freq: Every day | RESPIRATORY_TRACT | Status: DC
  Administered 2016-12-11 – 2016-12-13 (×3): 18 ug via RESPIRATORY_TRACT
  Filled 2016-12-10: qty 5

## 2016-12-10 MED ORDER — DILTIAZEM HCL 25 MG/5ML IV SOLN
INTRAVENOUS | Status: AC
  Administered 2016-12-10: 5 mg via INTRAVENOUS
  Filled 2016-12-10: qty 5

## 2016-12-10 MED ORDER — FUROSEMIDE 10 MG/ML IJ SOLN
40.0000 mg | Freq: Once | INTRAMUSCULAR | Status: AC
  Administered 2016-12-11: 40 mg via INTRAVENOUS
  Filled 2016-12-10: qty 4

## 2016-12-10 MED ORDER — IPRATROPIUM-ALBUTEROL 0.5-2.5 (3) MG/3ML IN SOLN
3.0000 mL | Freq: Once | RESPIRATORY_TRACT | Status: AC
  Administered 2016-12-10: 3 mL via RESPIRATORY_TRACT
  Filled 2016-12-10: qty 3

## 2016-12-10 MED ORDER — DILTIAZEM HCL 25 MG/5ML IV SOLN
5.0000 mg | Freq: Once | INTRAVENOUS | Status: AC
  Administered 2016-12-10: 5 mg via INTRAVENOUS

## 2016-12-10 MED ORDER — TORSEMIDE 20 MG PO TABS
40.0000 mg | ORAL_TABLET | Freq: Every day | ORAL | Status: DC
  Administered 2016-12-11 – 2016-12-13 (×3): 40 mg via ORAL
  Filled 2016-12-10 (×3): qty 2

## 2016-12-10 MED ORDER — DILTIAZEM HCL ER COATED BEADS 120 MG PO CP24
120.0000 mg | ORAL_CAPSULE | Freq: Every day | ORAL | Status: DC
  Administered 2016-12-11 – 2016-12-13 (×3): 120 mg via ORAL
  Filled 2016-12-10 (×3): qty 1

## 2016-12-10 MED ORDER — MOMETASONE FURO-FORMOTEROL FUM 200-5 MCG/ACT IN AERO
2.0000 | INHALATION_SPRAY | Freq: Two times a day (BID) | RESPIRATORY_TRACT | Status: DC
  Administered 2016-12-11 – 2016-12-13 (×6): 2 via RESPIRATORY_TRACT
  Filled 2016-12-10: qty 8.8

## 2016-12-10 MED ORDER — VITAMIN D 1000 UNITS PO TABS
1000.0000 [IU] | ORAL_TABLET | Freq: Every day | ORAL | Status: DC
  Administered 2016-12-11 – 2016-12-13 (×3): 1000 [IU] via ORAL
  Filled 2016-12-10 (×3): qty 1

## 2016-12-10 MED ORDER — MONTELUKAST SODIUM 10 MG PO TABS
10.0000 mg | ORAL_TABLET | Freq: Every day | ORAL | Status: DC
  Administered 2016-12-10 – 2016-12-12 (×3): 10 mg via ORAL
  Filled 2016-12-10 (×3): qty 1

## 2016-12-10 MED ORDER — DM-GUAIFENESIN ER 30-600 MG PO TB12: 1.0000 | ORAL_TABLET | Freq: Two times a day (BID) | ORAL | Status: DC

## 2016-12-10 MED ORDER — LISINOPRIL 5 MG PO TABS
5.0000 mg | ORAL_TABLET | Freq: Every day | ORAL | Status: DC
  Administered 2016-12-11 – 2016-12-13 (×3): 5 mg via ORAL
  Filled 2016-12-10 (×3): qty 1

## 2016-12-10 MED ORDER — AZITHROMYCIN 500 MG PO TABS
500.0000 mg | ORAL_TABLET | Freq: Every day | ORAL | Status: DC
  Administered 2016-12-11 – 2016-12-13 (×3): 500 mg via ORAL
  Filled 2016-12-10 (×3): qty 1

## 2016-12-10 MED ORDER — SODIUM CHLORIDE 0.9 % IV SOLN: 250.0000 mL | INTRAVENOUS | Status: DC | PRN

## 2016-12-10 MED ORDER — METOPROLOL TARTRATE 25 MG PO TABS
25.0000 mg | ORAL_TABLET | Freq: Two times a day (BID) | ORAL | Status: DC
  Administered 2016-12-10 – 2016-12-13 (×6): 25 mg via ORAL
  Filled 2016-12-10 (×6): qty 1

## 2016-12-10 MED ORDER — SODIUM CHLORIDE 0.9% FLUSH
3.0000 mL | Freq: Two times a day (BID) | INTRAVENOUS | Status: DC
  Administered 2016-12-10 – 2016-12-13 (×6): 3 mL via INTRAVENOUS

## 2016-12-10 MED ORDER — DEXTROMETHORPHAN POLISTIREX ER 30 MG/5ML PO SUER
30.0000 mg | Freq: Two times a day (BID) | ORAL | Status: DC
  Administered 2016-12-11 – 2016-12-13 (×5): 30 mg via ORAL
  Filled 2016-12-10 (×7): qty 5

## 2016-12-10 MED ORDER — METHYLPREDNISOLONE SODIUM SUCC 125 MG IJ SOLR
60.0000 mg | Freq: Four times a day (QID) | INTRAMUSCULAR | Status: DC
  Administered 2016-12-10 – 2016-12-11 (×3): 60 mg via INTRAVENOUS
  Filled 2016-12-10 (×3): qty 2

## 2016-12-10 MED ORDER — ONDANSETRON HCL 4 MG/2ML IJ SOLN: 4.0000 mg | Freq: Four times a day (QID) | INTRAMUSCULAR | Status: DC | PRN

## 2016-12-10 MED ORDER — POTASSIUM CHLORIDE CRYS ER 20 MEQ PO TBCR
20.0000 meq | EXTENDED_RELEASE_TABLET | Freq: Every day | ORAL | Status: DC
  Administered 2016-12-11 – 2016-12-13 (×3): 20 meq via ORAL
  Filled 2016-12-10 (×3): qty 1

## 2016-12-10 MED ORDER — ASPIRIN EC 81 MG PO TBEC
81.0000 mg | DELAYED_RELEASE_TABLET | Freq: Every day | ORAL | Status: DC
  Administered 2016-12-11 – 2016-12-13 (×3): 81 mg via ORAL
  Filled 2016-12-10 (×3): qty 1

## 2016-12-10 MED ORDER — ATORVASTATIN CALCIUM 20 MG PO TABS
20.0000 mg | ORAL_TABLET | Freq: Every day | ORAL | Status: DC
  Administered 2016-12-11 – 2016-12-13 (×3): 20 mg via ORAL
  Filled 2016-12-10 (×3): qty 1

## 2016-12-10 NOTE — ED Provider Notes (Signed)
[facility] Emergency Department Provider Note   ____________________________________________   First MD Initiated Contact with Patient 12/10/16 1928     (approximate)  I have reviewed the triage vital signs and the nursing notes.   HISTORY  Chief Complaint Shortness of Breath    HPI Elaine Decker is a 70 y.o. female reports increasing shortness of breath for about a week. Today got very bad she's been coughing up 10 sputum or beige sputum she has no fever no chest pain she gets much worse if she walks. He is usually on 2 L of oxygen. Emergency room she gets up to go 10 feet to the toilet gets back and she is satting 75% using accessory muscles and extremely short of breath lying in the bed. She has had elevated troponin past visits been several months since his been any where near as high as it is now.   Past Medical History:  Diagnosis Date  . A-fib ([place])   . Asthma   . CHF (congestive heart failure) ([place])   . CKD (chronic kidney disease)   . COPD (chronic obstructive pulmonary disease) ([place])   . HLD (hyperlipidemia)   . Hypertension     Patient Active Problem List   Diagnosis Date Noted  . Acute on chronic systolic congestive heart failure ([place]) 11/05/2016  . Occasional tremors 08/07/2016  . Chronic systolic heart failure ([place]) 04/04/2016  . COPD (chronic obstructive pulmonary disease) with chronic bronchitis ([place]) 04/04/2016  . Obstructive sleep apnea 04/04/2016  . Bilateral pneumonia 01/24/2016  . Status post thoracentesis 01/24/2016  . Prediabetes 01/24/2016  . Swelling of lower extremity 01/24/2016  . Generalized weakness 01/24/2016  . Paroxysmal atrial fibrillation ([place]) 01/19/2016  . Essential hypertension 01/19/2016  . Recurrent right pleural effusion 01/19/2016  . HLD (hyperlipidemia) 01/19/2016  . Rapid atrial fibrillation ([place]) 11/28/2015    Past Surgical History:  Procedure Laterality Date  . FLEXIBLE BRONCHOSCOPY N/A  02/01/2016   Procedure: FLEXIBLE BRONCHOSCOPY;  Surgeon: Allyne Gee, MD;  Location: [facility] ORS;  Service: Pulmonary;  Laterality: N/A;  . Surgery on the neck      Prior to Admission medications   Medication Sig Start Date End Date Taking? Authorizing Provider  acetaminophen (TYLENOL) 500 MG tablet Take 500-1,000 mg by mouth daily.    Historical Provider, MD  albuterol (PROVENTIL HFA;VENTOLIN HFA) 108 (90 Base) MCG/ACT inhaler Inhale 2 puffs into the lungs every 4 (four) hours as needed for wheezing or shortness of breath.    Historical Provider, MD  atorvastatin (LIPITOR) 20 MG tablet Take 20 mg by mouth daily.    Historical Provider, MD  cholecalciferol (VITAMIN D) 1000 UNITS tablet Take 1,000 Units by mouth daily.    Historical Provider, MD  dabigatran (PRADAXA) 150 MG CAPS capsule Take 150 mg by mouth 2 (two) times daily.    Historical Provider, MD  digoxin (LANOXIN) 0.25 MG tablet Take 1 tablet (0.25 mg total) by mouth daily. 06/26/16   Alisa Graff, FNP  diltiazem (DILACOR XR) 120 MG 24 hr capsule Take 1 capsule (120 mg total) by mouth daily. 11/09/16   Henreitta Leber, MD  Fluticasone-Salmeterol (ADVAIR) 250-50 MCG/DOSE AEPB Inhale 1 puff into the lungs 2 (two) times daily.    Historical Provider, MD  ipratropium-albuterol (DUONEB) 0.5-2.5 (3) MG/3ML SOLN Take 3 mLs by nebulization every 6 (six) hours. Patient taking differently: Take 3 mLs by nebulization every 6 (six) hours as needed (for wheezing/shortness of breath).  11/30/15  Fritzi Mandes, MD  lisinopril (PRINIVIL,ZESTRIL) 5 MG tablet TAKE 1 TABLET(5 MG) BY MOUTH DAILY 07/25/16   Alisa Graff, FNP  metoprolol tartrate (LOPRESSOR) 25 MG tablet Take 25 mg by mouth 2 (two) times daily. Take 25mg  AM and 50mg  PM    Historical Provider, MD  montelukast (SINGULAIR) 10 MG tablet Take 10 mg by mouth at bedtime.     Historical Provider, MD  Multiple Vitamin (MULTIVITAMIN) tablet Take 1 tablet by mouth daily.    Historical Provider, MD    potassium chloride SA (K-DUR,KLOR-CON) 20 MEQ tablet Take 1 tablet (20 mEq total) by mouth daily. 11/09/16   Henreitta Leber, MD  tiotropium (SPIRIVA) 18 MCG inhalation capsule Place 18 mcg into inhaler and inhale daily.    Historical Provider, MD  torsemide (DEMADEX) 20 MG tablet Take 2 tablets (40 mg total) by mouth daily. 11/28/16 12/28/16  Alisa Graff, FNP    Allergies Patient has no known allergies.  Family History  Problem Relation Age of Onset  . CAD Mother   . Lung cancer Father   . Arrhythmia Brother     Social History Social History  Substance Use Topics  . Smoking status: Former Smoker    Packs/day: 2.00    Years: 50.00    Quit date: 11/27/2015  . Smokeless tobacco: Never Used  . Alcohol use No    Review of Systems Constitutional: No fever/chills Eyes: No visual changes. ENT: No sore throat. Cardiovascular: Denies chest pain. Respiratory:  shortness of breath. Gastrointestinal: No abdominal pain.  No nausea, no vomiting.  No diarrhea.  No constipation. Genitourinary: Negative for dysuria. Musculoskeletal: Negative for back pain. Skin: Negative for rash.  10-point ROS otherwise negative.  ____________________________________________   PHYSICAL EXAM:  VITAL SIGNS: ED Triage Vitals  Enc Vitals Group     BP 12/10/16 1855 (!) 172/106     Pulse Rate 12/10/16 1855 (!) 129     Resp 12/10/16 1855 20     Temp 12/10/16 1855 97.3 F (36.3 C)     Temp src --      SpO2 12/10/16 1855 90 %     Weight 12/10/16 1853 160 lb (72.6 kg)     Height 12/10/16 1853 5\' 3"  (1.6 m)     Head Circumference --      Peak Flow --      Pain Score --      Pain Loc --      Pain Edu? --      Excl. in [place]? --     Constitutional: Alert and oriented.  Eyes: Conjunctivae are normal. PERRL. EOMI. Head: Atraumatic. Nose: No congestion/rhinnorhea. Mouth/Throat: Mucous membranes are moist.  Oropharynx non-erythematous. Neck: No stridor. Cardiovascular: Normal rate, regular  rhythm. Grossly normal heart sounds.  Good peripheral circulation. Respiratory: Normal respiratory effort.  No retractions. Lungs crackles especially in the bases quiet on the right base after walking she is in marked respiratory distress with retractions and accessory muscle use and actually looks quite ill briefly. Gastrointestinal: Soft and nontender. No distention. No Musculoskeletal: No lower extremity tenderness positive edema.  No joint effusions. Neurologic:  Normal speech and language. No gross focal neurologic deficits are appreciated. No gait instability. Skin:  Skin is warm, dry and intact. No rash noted.   ____________________________________________   LABS (all labs ordered are listed, but only abnormal results are displayed)  Labs Reviewed  COMPREHENSIVE METABOLIC PANEL - Abnormal; Notable for the following:       Result  Value   Chloride 95 (*)    CO2 36 (*)    Glucose, Bld 216 (*)    All other components within normal limits  TROPONIN I - Abnormal; Notable for the following:    Troponin I 0.10 (*)    All other components within normal limits  BRAIN NATRIURETIC PEPTIDE - Abnormal; Notable for the following:    B Natriuretic Peptide 1,483.0 (*)    All other components within normal limits  CBC WITH DIFFERENTIAL/PLATELET - Abnormal; Notable for the following:    MCHC 30.4 (*)    RDW 18.4 (*)    Lymphs Abs 0.9 (*)    All other components within normal limits   ____________________________________________  EKG  EKG shows what appears to be atrial flutter with irregular response some ST segment depression inferiorly and laterally. Slightly worse than previous ____________________________________________  RADIOLOGY  x-ray appears to show a right-sided effusion somewhat worse than before and congestive failure ____________________________________________   PROCEDURES  Procedure(s) performed:  Procedures  Critical Care performed:    ____________________________________________   INITIAL IMPRESSION / ASSESSMENT AND PLAN / ED COURSE  Pertinent labs & imaging results that were available during my care of the patient were reviewed by me and considered in my medical decision making (see chart for details).    Clinical Course      ____________________________________________   FINAL CLINICAL IMPRESSION(S) / ED DIAGNOSES  Final diagnoses:  COPD exacerbation ([place])  Acute on chronic congestive heart failure, unspecified congestive heart failure type (H[facility])  Elevated troponin      NEW MEDICATIONS STARTED DURING THIS VISIT:  New Prescriptions   No medications on file     Note:  This document was prepared using Dragon voice recognition software and may include unintentional dictation errors.    Nena Polio, MD 12/10/16 [phone]

## 2016-12-10 NOTE — ED Notes (Signed)
Pt assisted to bathroom x2.

## 2016-12-10 NOTE — ED Triage Notes (Signed)
Pt reports increasing SOB over the past two days. Pt reports productive cough. Pt reports beige sputum. Denies fever. Denies chest pain.

## 2016-12-10 NOTE — H&P (Signed)
[place] [facility] Admission History and Physical [employer], D.O.    Patient Name: Elaine Decker MR#: [id number] Date of Birth: 08-06-1946 Date of Admission: 12/10/2016  Referring MD/NP/PA: Dr. Cinda Quest Primary Care Physician: Glendon Axe, MD Outpatient Specialists: Dr. Saralyn Pilar  Patient coming from: Home  Chief Complaint: SOB  HPI: Elaine Decker is a 70 y.o. 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  Patient was in a usual state of health until about one week ago when she reports worsening shortness of breath, dyspnea on exertion and cough. She did not seek any medical attention, did not need to increase her oxygen requirement and did not take any additional medication. She became acutely worse today reporting that her cough has become productive of dark sputum and she has had dyspnea on exertion impairing her activities of daily living. She has had associated fevers and chills.  Of note patient follows in the heart failure clinic and her medications have recently been adjusted.   Otherwise there has been no change in status. Patient has been taking medication as prescribed and there has been no recent change in diet.  There has been no recent illness, travel or sick contacts.    Patient denies weakness, dizziness, chest pain, N/V/C/D, abdominal pain, dysuria/frequency, changes in mental status.   ED Course: Patient received steroids, DuoNeb's, Lasix, and aspirin.  Review of Systems:  CONSTITUTIONAL: No fever/chills, fatigue, weakness, weight gain/loss, headache. EYES: No blurry or double vision. ENT: No tinnitus, postnasal drip, redness or soreness of the oropharynx. RESPIRATORY: Positive cough, dyspnea, wheeze, negative hemoptysis.  CARDIOVASCULAR:  No chest pain, palpitations, syncope, orthopnea,  GASTROINTESTINAL: No nausea, vomiting, abdominal pain, constipation, diarrhea.  No hematemesis, melena or hematochezia. GENITOURINARY: No dysuria, frequency, hematuria. ENDOCRINE: No polyuria or nocturia. No heat or cold intolerance. HEMATOLOGY: No anemia, bruising, bleeding. INTEGUMENTARY: No rashes, ulcers, lesions. MUSCULOSKELETAL: No arthritis, gout, dyspnea.  NEUROLOGIC: No numbness, tingling, ataxia, seizure-type activity, weakness. PSYCHIATRIC: No anxiety, depression, insomnia.   Past Medical History:  Diagnosis Date  . A-fib ([place])   . Asthma   . CHF (congestive heart failure) ([place])   . CKD (chronic kidney disease)   . COPD (chronic obstructive pulmonary disease) ([place])   . HLD (hyperlipidemia)   . Hypertension     Past Surgical History:  Procedure Laterality Date  . FLEXIBLE BRONCHOSCOPY N/A 02/01/2016   Procedure: FLEXIBLE BRONCHOSCOPY;  Surgeon: Allyne Gee, MD;  Location: [facility] ORS;  Service: Pulmonary;  Laterality: N/A;  . Surgery on the neck       reports that she quit smoking about a year ago. She has a 100.00 pack-year smoking history. She has never used smokeless tobacco. She reports that she does not drink alcohol or use drugs.  No Known Allergies  Family History  Problem Relation Age of Onset  . CAD Mother   . Lung cancer Father   . Arrhythmia Brother    Family history has been reviewed and confirmed with patient.   Prior to Admission medications   Medication Sig Start Date End Date Taking? Authorizing Provider  acetaminophen (TYLENOL) 500 MG tablet Take 500-1,000 mg by mouth daily.   Yes Historical Provider, MD  albuterol (PROVENTIL HFA;VENTOLIN HFA) 108 (90 Base) MCG/ACT inhaler Inhale 2 puffs into the lungs every 4 (four) hours as needed for wheezing or shortness of breath.  Yes Historical Provider, MD  atorvastatin (LIPITOR) 20 MG tablet Take 20 mg by mouth daily.   Yes Historical Provider, MD   cholecalciferol (VITAMIN D) 1000 UNITS tablet Take 1,000 Units by mouth daily.   Yes Historical Provider, MD  dabigatran (PRADAXA) 150 MG CAPS capsule Take 150 mg by mouth 2 (two) times daily.   Yes Historical Provider, MD  digoxin (LANOXIN) 0.25 MG tablet Take 1 tablet (0.25 mg total) by mouth daily. 06/26/16  Yes Alisa Graff, FNP  diltiazem (DILACOR XR) 120 MG 24 hr capsule Take 1 capsule (120 mg total) by mouth daily. 11/09/16  Yes Henreitta Leber, MD  Fluticasone-Salmeterol (ADVAIR) 250-50 MCG/DOSE AEPB Inhale 1 puff into the lungs 2 (two) times daily.   Yes Historical Provider, MD  ipratropium-albuterol (DUONEB) 0.5-2.5 (3) MG/3ML SOLN Take 3 mLs by nebulization every 6 (six) hours. Patient taking differently: Take 3 mLs by nebulization every 6 (six) hours as needed (for wheezing/shortness of breath).  11/30/15  Yes Fritzi Mandes, MD  lisinopril (PRINIVIL,ZESTRIL) 5 MG tablet TAKE 1 TABLET(5 MG) BY MOUTH DAILY 07/25/16  Yes Alisa Graff, FNP  metoprolol tartrate (LOPRESSOR) 25 MG tablet Take 25 mg by mouth 2 (two) times daily. Take 25mg  AM and 50mg  PM   Yes Historical Provider, MD  montelukast (SINGULAIR) 10 MG tablet Take 10 mg by mouth at bedtime.    Yes Historical Provider, MD  Multiple Vitamin (MULTIVITAMIN) tablet Take 1 tablet by mouth daily.   Yes Historical Provider, MD  potassium chloride SA (K-DUR,KLOR-CON) 20 MEQ tablet Take 1 tablet (20 mEq total) by mouth daily. 11/09/16  Yes Henreitta Leber, MD  tiotropium (SPIRIVA) 18 MCG inhalation capsule Place 18 mcg into inhaler and inhale daily.   Yes Historical Provider, MD  torsemide (DEMADEX) 20 MG tablet Take 2 tablets (40 mg total) by mouth daily. 11/28/16 12/28/16 Yes Alisa Graff, FNP    Physical Exam: Vitals:   12/10/16 2000 12/10/16 2030 12/10/16 2100 12/10/16 2130  BP: (!) 152/96 (!) 157/79 (!) 184/87 (!) 161/92  Pulse: (!) 130 (!) 43 (!) 43 (!) 43  Resp: (!) 22 (!) 22 (!) 24 (!) 24  Temp:      SpO2: 92% 93% 92% 91%   Weight:      Height:        GENERAL: 70 y.o.-year-old Chronically ill-appearing white female patient, well-developed, well-nourished lying in the bed in no acute distress.  Pleasant and cooperative.   HEENT: Head atraumatic, normocephalic. Pupils equal, round, reactive to light and accommodation. No scleral icterus. Extraocular muscles intact. Nares are patent. Oropharynx is clear. Mucus membranes moist. NECK: Supple, full range of motion. No JVD, no bruit heard. No thyroid enlargement, no tenderness, no cervical lymphadenopathy. CHEST: Bibasilar crackles with diffuse expiratory wheezing.. No use of accessory muscles of respiration.  No reproducible chest wall tenderness.  CARDIOVASCULAR: S1, S2 normal. No murmurs, rubs, or gallops. Cap refill <2 seconds. Pulses intact distally.  ABDOMEN: Soft, nondistended, nontender, . No rebound, guarding, rigidity. Normoactive bowel sounds present in all four quadrants. No organomegaly or mass. EXTREMITIES: No pedal edema, cyanosis, or clubbing. NEUROLOGIC: Cranial nerves II through XII are grossly intact with no focal sensorimotor deficit. Muscle strength 5/5 in all extremities. Sensation intact. Gait not checked. PSYCHIATRIC: The patient is alert and oriented x 3. Normal affect, mood, thought content. SKIN: Warm, dry, and intact without obvious rash, lesion, or ulcer.   Labs on Admission:  CBC:  Recent Labs Lab 12/10/16 1912  WBC 6.5  NEUTROABS 5.0  HGB 12.2  HCT 40.2  MCV 90.4  PLT [id number]   Basic Metabolic Panel:  Recent Labs Lab 12/10/16 1912  NA 138  K 4.3  CL 95*  CO2 36*  GLUCOSE 216*  BUN 17  CREATININE 0.95  CALCIUM 9.2   GFR: Estimated Creatinine Clearance: 54.1 mL/min (by C-G formula based on SCr of 0.95 mg/dL). Liver Function Tests:  Recent Labs Lab 12/10/16 1912  AST 36  ALT 40  ALKPHOS 95  BILITOT 0.7  PROT 7.4  ALBUMIN 3.6   No results for input(s): LIPASE, AMYLASE in the last 168 hours. No results for  input(s): AMMONIA in the last 168 hours. Coagulation Profile: No results for input(s): INR, PROTIME in the last 168 hours. Cardiac Enzymes:  Recent Labs Lab 12/10/16 1912  TROPONINI 0.10*   Radiological Exams on Admission: Dg Chest Portable 1 View  Result Date: 12/10/2016 CLINICAL DATA:  Productive cough and dyspnea x2 days EXAM: PORTABLE CHEST 1 VIEW COMPARISON:  11/05/2016 FINDINGS: Slight increase in moderate volume right pleural effusion with adjacent compressive atelectasis. Superimposed pneumonia not entirely excluded at the right lung base. Persistent interstitial edema consistent with CHF. Right heart border is obscured by the pleural effusion. There is aortic atherosclerosis involving the arch. Multiple old bilateral rib deformities consistent with fractures. IMPRESSION: Slight increase in moderate sized right effusion with persistent mild CHF. Compressive atelectasis noted at the right lung base. Superimposed pneumonia is not entirely excluded. Electronically Signed   By: Ashley Royalty M.D.   On: 12/10/2016 20:01    EKG: Atrial flutter with normal axis, inferior ST depressions, lateral ST depressions and nonspecific ST-T wave changes.   Assessment/Plan Active Problems:   Acute exacerbation of CHF (congestive heart failure) ([place])    This is a 70 y.o. 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 Acute exacerbation of chronic systolic congestive heart failure - Telemetry monitoring. - Diuresis with Lasix, continue Demadex, potassium -Strict intake/output, daily weight. - Trend troponins, check lipids and TSH. - Cardiology consultation requested. 2. Non-STEMI, elevated troponin may  be secondary to demand ischemia however given her history we will start heparin - Trend troponins, check lipids and TSH. - Morphine, nitro, beta blocker, aspirin and statin ordered.   3. Atrial fibrillation with rapid ventricular response -Continue diltiazem and digoxin -Hold Pradaxa for heparin 4. COPD/asthma - IV steroids  -Continue Spiriva and Singulair - Nebulizers, O2 therapy and expectorants as needed.  - Continuous pulse oximetry - Consider pulmonary consult if not improving.  4. Hyperglycemia -Regular insulin sliding scale coverage 5. History of hyperlipidemia-continue Lipitor 6. History of hypertension-continue lisinopril  Admission status: Inpatient telemetry IV Fluids: Hep-Lock Diet/Nutrition: Heart healthy, carb controlled Consults called: Cardiology  DVT Px: Heparin SCDs and early ambulation Code Status: Full Code  Disposition Plan: To home in 2-3 days   All the records are reviewed and case discussed with ED provider. Management plans discussed with the patient and/or family who express understanding and agree with plan of care.  Yaakov Saindon D.O. on 12/10/2016 at 10:10 PM Between 7am to 6pm - Pager - [phone] After 6pm go to [email or web address] - Proofreader [facility] Office [phone] CC: Primary care physician; Glendon Axe, MD   12/10/2016, 10:10 PM

## 2016-12-11 ENCOUNTER — Inpatient Hospital Stay: Payer: Medicare Other

## 2016-12-11 LAB — CBC WITH DIFFERENTIAL/PLATELET
BASOS ABS: 0 10*3/uL (ref 0–0.1)
BASOS PCT: 1 %
EOS ABS: 0.1 10*3/uL (ref 0–0.7)
EOS PCT: 1 %
HEMATOCRIT: 34.1 % — AB (ref 35.0–47.0)
HEMOGLOBIN: 10.8 g/dL — AB (ref 12.0–16.0)
LYMPHS ABS: 1.2 10*3/uL (ref 1.0–3.6)
Lymphocytes Relative: 17 %
MCH: 27.9 pg (ref 26.0–34.0)
MCHC: 31.6 g/dL — ABNORMAL LOW (ref 32.0–36.0)
MCV: 88.1 fL (ref 80.0–100.0)
MONO ABS: 0.8 10*3/uL (ref 0.2–0.9)
MONOS PCT: 11 %
Neutro Abs: 5.1 10*3/uL (ref 1.4–6.5)
Neutrophils Relative %: 70 %
Platelets: 212 10*3/uL (ref 150–440)
RBC: 3.87 MIL/uL (ref 3.80–5.20)
RDW: 17.7 % — ABNORMAL HIGH (ref 11.5–14.5)
WBC: 7.3 10*3/uL (ref 3.6–11.0)

## 2016-12-11 LAB — CBC
HEMATOCRIT: 37.1 % (ref 35.0–47.0)
HEMOGLOBIN: 11.6 g/dL — AB (ref 12.0–16.0)
MCH: 27.8 pg (ref 26.0–34.0)
MCHC: 31.1 g/dL — AB (ref 32.0–36.0)
MCV: 89.4 fL (ref 80.0–100.0)
Platelets: 221 10*3/uL (ref 150–440)
RBC: 4.15 MIL/uL (ref 3.80–5.20)
RDW: 18.3 % — ABNORMAL HIGH (ref 11.5–14.5)
WBC: 6.7 10*3/uL (ref 3.6–11.0)

## 2016-12-11 LAB — LIPID PANEL
CHOL/HDL RATIO: 2.2 ratio
Cholesterol: 127 mg/dL (ref 0–200)
HDL: 59 mg/dL (ref >40)
LDL Cholesterol: 59 mg/dL (ref 0–99)
Triglycerides: 45 mg/dL (ref <150)
VLDL: 9 mg/dL (ref 0–40)

## 2016-12-11 LAB — GLUCOSE, CAPILLARY
GLUCOSE-CAPILLARY: 212 mg/dL — AB (ref 65–99)
GLUCOSE-CAPILLARY: 141 mg/dL — AB (ref 65–99)
GLUCOSE-CAPILLARY: 213 mg/dL — AB (ref 65–99)
Glucose-Capillary: 157 mg/dL — ABNORMAL HIGH (ref 65–99)

## 2016-12-11 LAB — BASIC METABOLIC PANEL
Anion gap: 6 (ref 5–15)
BUN: 16 mg/dL (ref 6–20)
CALCIUM: 8.8 mg/dL — AB (ref 8.9–10.3)
CHLORIDE: 98 mmol/L — AB (ref 101–111)
CO2: 38 mmol/L — AB (ref 22–32)
CREATININE: 0.69 mg/dL (ref 0.44–1.00)
GFR calc Af Amer: >60 mL/min (ref >60)
GFR calc non Af Amer: >60 mL/min (ref >60)
Glucose, Bld: 103 mg/dL — ABNORMAL HIGH (ref 65–99)
Potassium: 3.9 mmol/L (ref 3.5–5.1)
SODIUM: 142 mmol/L (ref 135–145)

## 2016-12-11 LAB — PROTIME-INR
INR: 1.41
Prothrombin Time: 17.4 seconds — ABNORMAL HIGH (ref 11.4–15.2)

## 2016-12-11 LAB — TSH: TSH: 1.595 u[IU]/mL (ref 0.350–4.500)

## 2016-12-11 LAB — APTT: aPTT: 42 seconds — ABNORMAL HIGH (ref 24–36)

## 2016-12-11 LAB — TROPONIN I: Troponin I: 0.11 ng/mL (ref <0.03)

## 2016-12-11 MED ORDER — HEPARIN (PORCINE) IN NACL 100-0.45 UNIT/ML-% IJ SOLN
800.0000 [IU]/h | INTRAMUSCULAR | Status: DC
  Administered 2016-12-11: 800 [IU]/h via INTRAVENOUS
  Filled 2016-12-11: qty 250

## 2016-12-11 MED ORDER — HEPARIN BOLUS VIA INFUSION
4000.0000 [IU] | Freq: Once | INTRAVENOUS | Status: AC
  Administered 2016-12-11: 4000 [IU] via INTRAVENOUS
  Filled 2016-12-11: qty 4000

## 2016-12-11 MED ORDER — INSULIN ASPART 100 UNIT/ML ~~LOC~~ SOLN
0.0000 [IU] | Freq: Three times a day (TID) | SUBCUTANEOUS | Status: DC
  Administered 2016-12-11: 3 [IU] via SUBCUTANEOUS
  Administered 2016-12-11: 2 [IU] via SUBCUTANEOUS
  Administered 2016-12-11: 5 [IU] via SUBCUTANEOUS
  Administered 2016-12-12: 3 [IU] via SUBCUTANEOUS
  Administered 2016-12-12: 2 [IU] via SUBCUTANEOUS
  Administered 2016-12-12 – 2016-12-13 (×2): 3 [IU] via SUBCUTANEOUS
  Administered 2016-12-13: 2 [IU] via SUBCUTANEOUS
  Filled 2016-12-11: qty 3
  Filled 2016-12-11: qty 5
  Filled 2016-12-11: qty 2
  Filled 2016-12-11: qty 3
  Filled 2016-12-11: qty 2
  Filled 2016-12-11: qty 3
  Filled 2016-12-11: qty 2
  Filled 2016-12-11: qty 3

## 2016-12-11 MED ORDER — METHYLPREDNISOLONE SODIUM SUCC 125 MG IJ SOLR
60.0000 mg | Freq: Two times a day (BID) | INTRAMUSCULAR | Status: DC
  Administered 2016-12-11 – 2016-12-12 (×2): 60 mg via INTRAVENOUS
  Filled 2016-12-11 (×2): qty 2

## 2016-12-11 MED ORDER — INSULIN ASPART 100 UNIT/ML ~~LOC~~ SOLN
0.0000 [IU] | Freq: Every day | SUBCUTANEOUS | Status: DC
  Administered 2016-12-11: 2 [IU] via SUBCUTANEOUS
  Filled 2016-12-11: qty 2

## 2016-12-11 MED ORDER — DABIGATRAN ETEXILATE MESYLATE 150 MG PO CAPS
150.0000 mg | ORAL_CAPSULE | Freq: Two times a day (BID) | ORAL | Status: DC
  Administered 2016-12-11 – 2016-12-13 (×5): 150 mg via ORAL
  Filled 2016-12-11 (×6): qty 1

## 2016-12-11 MED ORDER — INSULIN GLARGINE 100 UNIT/ML ~~LOC~~ SOLN
10.0000 [IU] | Freq: Every day | SUBCUTANEOUS | Status: DC
  Administered 2016-12-11 – 2016-12-13 (×3): 10 [IU] via SUBCUTANEOUS
  Filled 2016-12-11 (×3): qty 0.1

## 2016-12-11 NOTE — Plan of Care (Signed)
Problem: Safety: Goal: Ability to remain free from injury will improve Outcome: Progressing Fall precautions in place  Problem: Fluid Volume: Goal: Ability to maintain a balanced intake and output will improve Outcome: Progressing stict I&O

## 2016-12-11 NOTE — Care Management (Signed)
Admitted to [facility] with the diagnosis of acute exacerbation of CHF. Lives alone. Brother is [name] [phone]) "He is on a trip right now." Elaine Decker is friend to call, if needed. Last seen Dr. Glendon Axe about a month ago. No home Health. No skilled facility. C-PAP per [facility] x 5-6 years. "His name is Elaine Decker." Chronic home oxygen per [facility] x 1 year. 2 liters per nasal cannula continuous. Nebulizer in the home. Works at [employer]. Will be retiring this month. Takes care of all basic and instrumental activities of Decker lving herself, drives. Prescriptions are filled at [facility] in [place]. No falls. Fair appetite. Elaine Decker, will transport. Elaine Ammons RN MSN CCM Care Management

## 2016-12-11 NOTE — Consult Note (Signed)
[facility] Consultation Note  Patient ID: Elaine Decker, MRN: [id number], DOB/AGE: 23-Dec-1945 70 y.o. Admit date: 12/10/2016   Date of Consult: 12/11/2016 Primary Physician: Glendon Axe, MD Primary Cardiologist: Paras[place]  Chief Complaint:  Chief Complaint  Patient presents with  . Shortness of Breath   Reason for Consult: acute on chronic systolic dysfunction heart failure  HPI: 70 y.o. female with the known apparent systolic dysfunction heart failure chronic obstructive pulmonary disease with 2 L of oxygen essential hypertension makes hyperlipidemia and paroxysmal nonvalvular atrial fibrillation having acute on chronic systolic dysfunction heart failure type symptoms including lower extremity edema shortness of breath weakness fatigue and hypoxia over a several day. The patient does be of 1483 and does have a minimal elevation of troponin to 0.1 consistent with demand ischemia rather than acute coronary syndrome. The patient has been on previously appropriate medication management for multiple issues listed above but has had increased weight gain and other issues possibly consistent with dietary indiscretion and/or changes in diuretics. There is no apparent infection anemia worsening chronic kidney disease or other unstable cardiac symptoms issue the patient is much more comfortable today after appropriate medication management  Past Medical History:  Diagnosis Date  . A-fib ([place])   . Asthma   . CHF (congestive heart failure) ([place])   . CKD (chronic kidney disease)   . COPD (chronic obstructive pulmonary disease) ([place])   . HLD (hyperlipidemia)   . Hypertension       Surgical History:  Past Surgical History:  Procedure Laterality Date  . FLEXIBLE BRONCHOSCOPY N/A 02/01/2016   Procedure: FLEXIBLE BRONCHOSCOPY;  Surgeon: Allyne Gee, MD;  Location: [facility] ORS;  Service: Pulmonary;  Laterality: N/A;  . Surgery on the neck       Home Meds: Prior to Admission  medications   Medication Sig Start Date End Date Taking? Authorizing Provider  acetaminophen (TYLENOL) 500 MG tablet Take 500-1,000 mg by mouth daily.   Yes Historical Provider, MD  albuterol (PROVENTIL HFA;VENTOLIN HFA) 108 (90 Base) MCG/ACT inhaler Inhale 2 puffs into the lungs every 4 (four) hours as needed for wheezing or shortness of breath.   Yes Historical Provider, MD  atorvastatin (LIPITOR) 20 MG tablet Take 20 mg by mouth daily.   Yes Historical Provider, MD  cholecalciferol (VITAMIN D) 1000 UNITS tablet Take 1,000 Units by mouth daily.   Yes Historical Provider, MD  dabigatran (PRADAXA) 150 MG CAPS capsule Take 150 mg by mouth 2 (two) times daily.   Yes Historical Provider, MD  digoxin (LANOXIN) 0.25 MG tablet Take 1 tablet (0.25 mg total) by mouth daily. 06/26/16  Yes Alisa Graff, FNP  diltiazem (DILACOR XR) 120 MG 24 hr capsule Take 1 capsule (120 mg total) by mouth daily. 11/09/16  Yes Henreitta Leber, MD  Fluticasone-Salmeterol (ADVAIR) 250-50 MCG/DOSE AEPB Inhale 1 puff into the lungs 2 (two) times daily.   Yes Historical Provider, MD  ipratropium-albuterol (DUONEB) 0.5-2.5 (3) MG/3ML SOLN Take 3 mLs by nebulization every 6 (six) hours. Patient taking differently: Take 3 mLs by nebulization every 6 (six) hours as needed (for wheezing/shortness of breath).  11/30/15  Yes Fritzi Mandes, MD  lisinopril (PRINIVIL,ZESTRIL) 5 MG tablet TAKE 1 TABLET(5 MG) BY MOUTH DAILY 07/25/16  Yes Alisa Graff, FNP  metoprolol tartrate (LOPRESSOR) 25 MG tablet Take 25 mg by mouth 2 (two) times daily. Take 25mg  AM and 50mg  PM   Yes Historical Provider, MD  montelukast (SINGULAIR) 10 MG tablet Take 10 mg  by mouth at bedtime.    Yes Historical Provider, MD  Multiple Vitamin (MULTIVITAMIN) tablet Take 1 tablet by mouth daily.   Yes Historical Provider, MD  potassium chloride SA (K-DUR,KLOR-CON) 20 MEQ tablet Take 1 tablet (20 mEq total) by mouth daily. 11/09/16  Yes Henreitta Leber, MD  tiotropium (SPIRIVA)  18 MCG inhalation capsule Place 18 mcg into inhaler and inhale daily.   Yes Historical Provider, MD  torsemide (DEMADEX) 20 MG tablet Take 2 tablets (40 mg total) by mouth daily. 11/28/16 12/28/16 Yes Alisa Graff, FNP    Inpatient Medications:  . aspirin EC  81 mg Oral Daily  . atorvastatin  20 mg Oral Daily  . azithromycin  500 mg Oral Daily  . cholecalciferol  1,000 Units Oral Daily  . guaiFENesin  600 mg Oral BID   And  . dextromethorphan  30 mg Oral BID  . digoxin  0.25 mg Oral Daily  . diltiazem  120 mg Oral Daily  . diltiazem  5 mg Intravenous Once  . furosemide  40 mg Intravenous Once  . insulin aspart  0-15 Units Subcutaneous TID WC  . insulin aspart  0-5 Units Subcutaneous QHS  . lisinopril  5 mg Oral Daily  . methylPREDNISolone (SOLU-MEDROL) injection  60 mg Intravenous Q6H  . metoprolol tartrate  25 mg Oral BID  . mometasone-formoterol  2 puff Inhalation BID  . montelukast  10 mg Oral QHS  . multivitamin with minerals  1 tablet Oral Daily  . potassium chloride SA  20 mEq Oral Daily  . sodium chloride flush  3 mL Intravenous Q12H  . tiotropium  18 mcg Inhalation Daily  . torsemide  40 mg Oral Daily   . heparin 800 Units/hr (12/11/16 0217)    Allergies: No Known Allergies  Social History   Social History  . Marital status: Divorced    Spouse name: N/A  . Number of children: N/A  . Years of education: N/A   Occupational History  . retired    Social History Main Topics  . Smoking status: Former Smoker    Packs/day: 2.00    Years: 50.00    Quit date: 11/27/2015  . Smokeless tobacco: Never Used  . Alcohol use No  . Drug use: No  . Sexual activity: Not on file   Other Topics Concern  . Not on file   Social History Narrative  . No narrative on file     Family History  Problem Relation Age of Onset  . CAD Mother   . Lung cancer Father   . Arrhythmia Brother      Review of Systems Positive for Weight gain and shortness of breath PND and  orthopnea Negative for: General:  chills, fever, night sweats or positive for weight changes.  Cardiovascular: Positive for PND orthopnea negative for syncope dizziness  Dermatological skin lesions rashes Respiratory: Cough congestion Urologic: Frequent urination urination at night and hematuria Abdominal: negative for nausea, vomiting, diarrhea, bright red blood per rectum, melena, or hematemesis Neurologic: negative for visual changes, and/or hearing changes  All other systems reviewed and are otherwise negative except as noted above.  Labs:  Recent Labs  12/10/16 1912 12/11/16 0022  TROPONINI 0.10* 0.11*   Lab Results  Component Value Date   WBC 7.3 12/11/2016   HGB 10.8 (L) 12/11/2016   HCT 34.1 (L) 12/11/2016   MCV 88.1 12/11/2016   PLT 212 12/11/2016    Recent Labs Lab 12/10/16 1912 12/11/16 0022  NA  138 142  K 4.3 3.9  CL 95* 98*  CO2 36* 38*  BUN 17 16  CREATININE 0.95 0.69  CALCIUM 9.2 8.8*  PROT 7.4  --   BILITOT 0.7  --   ALKPHOS 95  --   ALT 40  --   AST 36  --   GLUCOSE 216* 103*   Lab Results  Component Value Date   CHOL 127 12/11/2016   HDL 59 12/11/2016   LDLCALC 59 12/11/2016   TRIG 45 12/11/2016   No results found for: DDIMER  Radiology/Studies:  Dg Chest 2 View  Result Date: 12/11/2016 CLINICAL DATA:  Shortness of breath and productive cough. EXAM: CHEST  2 VIEW COMPARISON:  Single-view of the chest 12/10/2016. PA and lateral chest 11/05/2016. CT chest 06/05/2016. FINDINGS: Right pleural effusion is unchanged. Right worse than left basilar airspace disease appears worsened. No pneumothorax. Heart size enlarged. Aortic atherosclerosis is noted. Remote bilateral rib fractures are seen. IMPRESSION: Worsened right greater than left basilar airspace disease could be due to increased atelectasis or pneumonia. Right pleural effusion is unchanged. Electronically Signed   By: Inge Rise M.D.   On: 12/11/2016 07:34   Dg Chest Portable 1  View  Result Date: 12/10/2016 CLINICAL DATA:  Productive cough and dyspnea x2 days EXAM: PORTABLE CHEST 1 VIEW COMPARISON:  11/05/2016 FINDINGS: Slight increase in moderate volume right pleural effusion with adjacent compressive atelectasis. Superimposed pneumonia not entirely excluded at the right lung base. Persistent interstitial edema consistent with CHF. Right heart border is obscured by the pleural effusion. There is aortic atherosclerosis involving the arch. Multiple old bilateral rib deformities consistent with fractures. IMPRESSION: Slight increase in moderate sized right effusion with persistent mild CHF. Compressive atelectasis noted at the right lung base. Superimposed pneumonia is not entirely excluded. Electronically Signed   By: Ashley Royalty M.D.   On: 12/10/2016 20:01    EKG: Normal sinus rhythm with left axis deviation and nonspecific ST and T-wave changes with poor R-wave progression  Weights: Filed Weights   12/10/16 1853 12/10/16 2326 12/11/16 0520  Weight: 72.6 kg (160 lb) 75.1 kg (165 lb 8 oz) 74.5 kg (164 lb 4.8 oz)     Physical Exam: Blood pressure (!) 153/69, pulse 73, temperature 98 F (36.7 C), temperature source Oral, resp. rate 16, height 5\' 3"  (1.6 m), weight 74.5 kg (164 lb 4.8 oz), SpO2 95 %. Body mass index is 29.1 kg/m. General: Well developed, well nourished, in no acute distress. Head eyes ears nose throat: Normocephalic, atraumatic, sclera non-icteric, no xanthomas, nares are without discharge. No apparent thyromegaly and/or mass  Lungs: Normal respiratory effort.  no wheezes, Significant 5 basilar decreased breath sounds and some rales, no rhonchi.  Heart: RRR with normal S1 S2. no murmur gallop, no rub, PMI is normal size and placement, carotid upstroke normal without bruit, jugular venous pressure is normal Abdomen: Soft, non-tender, non-distended with normoactive bowel sounds. No hepatomegaly. No rebound/guarding. No obvious abdominal masses. Abdominal  aorta is normal size without bruit Extremities: 1-2+ edema. no cyanosis, no clubbing, no ulcers  Peripheral : 2+ bilateral upper extremity pulses, 2+ bilateral femoral pulses, 1 + bilateral dorsal pedal pulse Neuro: Alert and oriented. No facial asymmetry. No focal deficit. Moves all extremities spontaneously. Musculoskeletal: Normal muscle tone without kyphosis Psych:  Responds to questions appropriately with a normal affect.    Assessment: 70 year old female with acute on chronic systolic dysfunction congestive heart failure chronic obstructive pulmonary disease essential hypertension makes hyperlipidemia and paroxysmal  nonvalvular atrial fibrillation without evidence of significant new functional issues or myocardial infarction but possible concerns of dietary indiscretion and change in diuretic use  Plan: 1. Continue intravenous diuretics at this time for pulmonary edema lower extremity edema 2. No change in heart rate control and maintenance of normal sinus rhythm with diltiazem and digoxin combination 3. Metoprolol for cardiomyopathy and heart failure as well as heart rate control of atrial fibrillation without change today 4. Echocardiogram for LV systolic dysfunction or changes consistent with exacerbation 5. No further intervention of minimal troponin elevation more consistent with demand ischemia rather than acute coronary syndrome 6. High intensity cholesterol therapy for further risk reduction cardiovascular event 7. Begin ambulation and follow for adjustments of medication management  Signed, Corey Skains M.D. [facility] 12/11/2016, 7:55 AM

## 2016-12-11 NOTE — Progress Notes (Addendum)
[facility] [place] NAME: Elaine Decker    MR#:  [id number]  DATE OF BIRTH:  1946/08/07  SUBJECTIVE:  CHIEF COMPLAINT:   Chief Complaint  Patient presents with  . Shortness of Breath   Better shortness of breath and leg swelling. On O2 [place] 3L. REVIEW OF SYSTEMS:  Review of Systems  Constitutional: Positive for malaise/fatigue. Negative for chills and fever.  HENT: Negative for congestion.   Eyes: Negative for blurred vision and double vision.  Respiratory: Positive for cough and shortness of breath. Negative for hemoptysis, sputum production, wheezing and stridor.   Cardiovascular: Positive for leg swelling. Negative for chest pain, palpitations and orthopnea.  Gastrointestinal: Negative for abdominal pain, diarrhea, nausea and vomiting.  Genitourinary: Negative for dysuria and hematuria.  Musculoskeletal: Negative for joint pain.  Skin: Negative for itching and rash.  Neurological: Positive for weakness. Negative for dizziness, focal weakness and loss of consciousness.  Psychiatric/Behavioral: Negative for depression. The patient is not nervous/anxious.     DRUG ALLERGIES:  No Known Allergies VITALS:  Blood pressure (!) 142/65, pulse 63, temperature 97.3 F (36.3 C), temperature source Oral, resp. rate (!) 22, height 5\' 3"  (1.6 m), weight 164 lb 4.8 oz (74.5 kg), SpO2 93 %. PHYSICAL EXAMINATION:  Physical Exam  Constitutional: She is oriented to person, place, and time and well-developed, well-nourished, and in no distress.  HENT:  Head: Normocephalic.  Mouth/Throat: Oropharynx is clear and moist.  Eyes: Conjunctivae and EOM are normal.  Neck: Neck supple. No JVD present. No tracheal deviation present.  Cardiovascular: Normal rate, regular rhythm and normal heart sounds.  Exam reveals no gallop.   No murmur heard. Pulmonary/Chest: Effort normal. No respiratory distress. She has no wheezes.  Very diminished lung sounds    Abdominal: Soft. Bowel sounds are normal. She exhibits no distension. There is no tenderness.  Musculoskeletal: Normal range of motion. She exhibits no edema or tenderness.  Neurological: She is alert and oriented to person, place, and time. No cranial nerve deficit.  Skin: No rash noted. No erythema.  Psychiatric: Affect normal.   LABORATORY PANEL:   CBC  Recent Labs Lab 12/11/16 0812  WBC 6.7  HGB 11.6*  HCT 37.1  PLT 221   ------------------------------------------------------------------------------------------------------------------ Chemistries   Recent Labs Lab 12/10/16 1912 12/11/16 0022  NA 138 142  K 4.3 3.9  CL 95* 98*  CO2 36* 38*  GLUCOSE 216* 103*  BUN 17 16  CREATININE 0.95 0.69  CALCIUM 9.2 8.8*  MG 1.8  --   AST 36  --   ALT 40  --   ALKPHOS 95  --   BILITOT 0.7  --    RADIOLOGY:  Dg Chest 2 View  Result Date: 12/11/2016 CLINICAL DATA:  Shortness of breath and productive cough. EXAM: CHEST  2 VIEW COMPARISON:  Single-view of the chest 12/10/2016. PA and lateral chest 11/05/2016. CT chest 06/05/2016. FINDINGS: Right pleural effusion is unchanged. Right worse than left basilar airspace disease appears worsened. No pneumothorax. Heart size enlarged. Aortic atherosclerosis is noted. Remote bilateral rib fractures are seen. IMPRESSION: Worsened right greater than left basilar airspace disease could be due to increased atelectasis or pneumonia. Right pleural effusion is unchanged. Electronically Signed   By: Inge Rise M.D.   On: 12/11/2016 07:34   Dg Chest Portable 1 View  Result Date: 12/10/2016 CLINICAL DATA:  Productive cough and dyspnea x2 days EXAM: PORTABLE CHEST 1 VIEW COMPARISON:  11/05/2016 FINDINGS: Slight  increase in moderate volume right pleural effusion with adjacent compressive atelectasis. Superimposed pneumonia not entirely excluded at the right lung base. Persistent interstitial edema consistent with CHF. Right heart border is  obscured by the pleural effusion. There is aortic atherosclerosis involving the arch. Multiple old bilateral rib deformities consistent with fractures. IMPRESSION: Slight increase in moderate sized right effusion with persistent mild CHF. Compressive atelectasis noted at the right lung base. Superimposed pneumonia is not entirely excluded. Electronically Signed   By: Ashley Royalty M.D.   On: 12/10/2016 20:01   ASSESSMENT AND PLAN:   This is a 70 y.o. 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 Acute on chronic systolic congestive heart failure - Telemetry monitoring. - Diuresis with Lasix, continue Demadex, potassium -Strict intake/output, daily weight. Continue intravenous diuretics at this time for pulmonary edema lower extremity edema Dr. Nehemiah Massed.  Acute on chronic respiratory failure with hypoxia. Try to wean to her baseline oxygen 2 L [place].  2. Elevated troponin, secondary to demand ischemia No further intervention of minimal troponin elevation more consistent with demand ischemia rather than acute coronary syndrome per Dr. Nehemiah Massed.  3. Atrial fibrillation with rapid ventricular response -Continue diltiazem and digoxin resume Pradaxa for heparin  4. COPD/asthma -taper IV steroids  -Continue Spiriva and Singulair - Nebulizers, O2 therapy and expectorants as needed.  - Continuous pulse oximetry  4. Hyperglycemia -Regular insulin sliding scale coverage and add lantus 10 units HS. 5. History of hyperlipidemia-continue Lipitor 6. History of hypertension-continue lisinopril  All the records are reviewed and case discussed with Care Management/Social Worker. Management plans discussed with the patient, family and they are in agreement.  CODE STATUS: full code.  TOTAL TIME TAKING CARE OF THIS PATIENT: 37 minutes.   More than  50% of the time was spent in counseling/coordination of care: YES  POSSIBLE D/C IN 2 DAYS, DEPENDING ON CLINICAL CONDITION.   Demetrios Loll M.D on 12/11/2016 at 12:16 PM  Between 7am to 6pm - Pager - [phone]  After 6pm go to [email or web address] - Proofreader  [facility]  Office  [phone]  CC: Primary care physician; Glendon Axe, MD  Note: This dictation was prepared with Dragon dictation along with smaller phrase technology. Any transcriptional errors that result from this process are unintentional.

## 2016-12-11 NOTE — Progress Notes (Signed)
ANTICOAGULATION CONSULT NOTE - Initial Consult  Pharmacy Consult for heparin drip Indication: NSTEMI  No Known Allergies  Patient Measurements: Height: 5\' 3"  (160 cm) Weight: 165 lb 8 oz (75.1 kg) IBW/kg (Calculated) : 52.4 Heparin Dosing Weight: 68kg  Vital Signs: Temp: 97.8 F (36.6 C) (12/25 2326) Temp Source: Oral (12/25 2326) BP: 161/66 (12/25 2326) Pulse Rate: 54 (12/25 2326)  Labs:  Recent Labs  12/10/16 1912 12/11/16 0022  HGB 12.2 10.8*  HCT 40.2 34.1*  PLT 234 212  APTT  --  42*  LABPROT  --  17.4*  INR  --  1.41  CREATININE 0.95 0.69  TROPONINI 0.10* 0.11*    Estimated Creatinine Clearance: 65.3 mL/min (by C-G formula based on SCr of 0.69 mg/dL).   Medical History: Past Medical History:  Diagnosis Date  . A-fib ([place])   . Asthma   . CHF (congestive heart failure) ([place])   . CKD (chronic kidney disease)   . COPD (chronic obstructive pulmonary disease) ([place])   . HLD (hyperlipidemia)   . Hypertension     Medications:  Patient on dabigatran as outpatient. Last dose 12/25 AM.  Assessment:  Goal of Therapy:  Heparin level 0.3-0.7 units/ml Monitor platelets by anticoagulation protocol: Yes   Plan:  4000 unit bolus and initial rate of 800 units/hr. First heparin level 6 hours after start of infusion.  Nataleah Scioneaux S 12/11/2016,1:18 AM

## 2016-12-12 LAB — BASIC METABOLIC PANEL
ANION GAP: 6 (ref 5–15)
BUN: 20 mg/dL (ref 6–20)
CHLORIDE: 92 mmol/L — AB (ref 101–111)
CO2: 39 mmol/L — ABNORMAL HIGH (ref 22–32)
Calcium: 8.4 mg/dL — ABNORMAL LOW (ref 8.9–10.3)
Creatinine, Ser: 0.77 mg/dL (ref 0.44–1.00)
Glucose, Bld: 161 mg/dL — ABNORMAL HIGH (ref 65–99)
POTASSIUM: 4.1 mmol/L (ref 3.5–5.1)
SODIUM: 137 mmol/L (ref 135–145)

## 2016-12-12 LAB — GLUCOSE, CAPILLARY
GLUCOSE-CAPILLARY: 160 mg/dL — AB (ref 65–99)
GLUCOSE-CAPILLARY: 139 mg/dL — AB (ref 65–99)
Glucose-Capillary: 177 mg/dL — ABNORMAL HIGH (ref 65–99)
Glucose-Capillary: 148 mg/dL — ABNORMAL HIGH (ref 65–99)

## 2016-12-12 LAB — HEMOGLOBIN A1C
Hgb A1c MFr Bld: 5.6 % (ref 4.8–5.6)
MEAN PLASMA GLUCOSE: 114 mg/dL

## 2016-12-12 MED ORDER — METHYLPREDNISOLONE SODIUM SUCC 40 MG IJ SOLR
40.0000 mg | Freq: Two times a day (BID) | INTRAMUSCULAR | Status: DC
  Administered 2016-12-12 – 2016-12-13 (×2): 40 mg via INTRAVENOUS
  Filled 2016-12-12 (×2): qty 1

## 2016-12-12 NOTE — Progress Notes (Signed)
[facility] [place] NAME: Elaine Decker    MR#:  [id number]  DATE OF BIRTH:  [birth year]  SUBJECTIVE:  CHIEF COMPLAINT:   Chief Complaint  Patient presents with  . Shortness of Breath   Better shortness of breath and no leg swelling. On O2 [place] 3L. REVIEW OF SYSTEMS:  Review of Systems  Constitutional: Negative for chills, fever and malaise/fatigue.  HENT: Negative for congestion.   Eyes: Negative for blurred vision and double vision.  Respiratory: Positive for cough and shortness of breath. Negative for hemoptysis, sputum production, wheezing and stridor.   Cardiovascular: Negative for chest pain, palpitations, orthopnea and leg swelling.  Gastrointestinal: Negative for abdominal pain, diarrhea, nausea and vomiting.  Genitourinary: Negative for dysuria and hematuria.  Musculoskeletal: Negative for joint pain.  Skin: Negative for itching and rash.  Neurological: Negative for dizziness, focal weakness, loss of consciousness and weakness.  Psychiatric/Behavioral: Negative for depression. The patient is not nervous/anxious.     DRUG ALLERGIES:  No Known Allergies VITALS:  Blood pressure 128/67, pulse 65, temperature 98.3 F (36.8 C), temperature source Oral, resp. rate 18, height 5\' 3"  (1.6 m), weight 165 lb 14.4 oz (75.3 kg), SpO2 96 %. PHYSICAL EXAMINATION:  Physical Exam  Constitutional: She is oriented to person, place, and time and well-developed, well-nourished, and in no distress.  HENT:  Head: Normocephalic.  Mouth/Throat: Oropharynx is clear and moist.  Eyes: Conjunctivae and EOM are normal.  Neck: Neck supple. No JVD present. No tracheal deviation present.  Cardiovascular: Normal rate, regular rhythm and normal heart sounds.  Exam reveals no gallop.   No murmur heard. Pulmonary/Chest: Effort normal. No respiratory distress. She has no wheezes. She has rales.  Very diminished lung sounds  Abdominal: Soft. Bowel sounds are  normal. She exhibits no distension. There is no tenderness.  Musculoskeletal: Normal range of motion. She exhibits no edema or tenderness.  Neurological: She is alert and oriented to person, place, and time. No cranial nerve deficit.  Skin: No rash noted. No erythema.  Psychiatric: Affect normal.   LABORATORY PANEL:   CBC  Recent Labs Lab 12/11/16 0812  WBC 6.7  HGB 11.6*  HCT 37.1  PLT 221   ------------------------------------------------------------------------------------------------------------------ Chemistries   Recent Labs Lab 12/10/16 1912  12/12/16 0402  NA 138  < > 137  K 4.3  < > 4.1  CL 95*  < > 92*  CO2 36*  < > 39*  GLUCOSE 216*  < > 161*  BUN 17  < > 20  CREATININE 0.95  < > 0.77  CALCIUM 9.2  < > 8.4*  MG 1.8  --   --   AST 36  --   --   ALT 40  --   --   ALKPHOS 95  --   --   BILITOT 0.7  --   --   < > = values in this interval not displayed. RADIOLOGY:  No results found. ASSESSMENT AND PLAN:   This is a 70 y.o. 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 Acute on chronic systolic congestive heart failure - Telemetry monitoring. Treated with Lasix, continue Demadex, potassium -Strict intake/output, daily weight.  Acute on chronic respiratory failure with hypoxia. Try to wean to her baseline oxygen 2 L [place].  2. Elevated troponin, secondary to demand ischemia No further intervention of  minimal troponin elevation more consistent with demand ischemia rather than acute coronary syndrome per Dr. Nehemiah Massed.  3. Atrial fibrillation with rapid ventricular response -Continue diltiazem and digoxin resumed Pradaxa.  4. COPD/asthma -taper IV steroids  -Continue Spiriva and Singulair - Nebulizers, O2 therapy and expectorants as needed.   4. Hyperglycemia -Regular insulin sliding scale coverage  and add lantus 10 units HS. 5. History of hyperlipidemia-continue Lipitor 6. History of hypertension-continue lisinopril  All the records are reviewed and case discussed with Care Management/Social Worker. Management plans discussed with the patient, family and they are in agreement.  CODE STATUS: full code.  TOTAL TIME TAKING CARE OF THIS PATIENT: 37 minutes.   More than 50% of the time was spent in counseling/coordination of care: YES  POSSIBLE D/C IN 1-2 DAYS, DEPENDING ON CLINICAL CONDITION.   Demetrios Loll M.D on 12/12/2016 at 2:26 PM  Between 7am to 6pm - Pager - [phone]  After 6pm go to [email or web address] - Proofreader  [facility]  Office  [phone]  CC: Primary care physician; Glendon Axe, MD  Note: This dictation was prepared with Dragon dictation along with smaller phrase technology. Any transcriptional errors that result from this process are unintentional.

## 2016-12-12 NOTE — Progress Notes (Signed)
[facility] Encounter Note  Patient: Elaine Decker / Admit Date: 12/10/2016 / Date of Encounter: 12/12/2016, 7:50 AM   Subjective: Patient feels much better today. No evidence of myocardial infarction. Heart failure symptoms of shortness of breath and edema improved. Heart rate now with atrial flutter  Review of Systems: Positive for: Shortness of breath Negative for: Vision change, hearing change, syncope, dizziness, nausea, vomiting,diarrhea, bloody stool, stomach pain, cough, congestion, diaphoresis, urinary frequency, urinary pain,skin lesions, skin rashes Others previously listed  Objective: Telemetry: Atrial flutter with controlled ventricular rate Physical Exam: Blood pressure (!) 149/70, pulse 64, temperature 98.2 F (36.8 C), temperature source Oral, resp. rate 18, height 5\' 3"  (1.6 m), weight 75.3 kg (165 lb 14.4 oz), SpO2 91 %. Body mass index is 29.39 kg/m. General: Well developed, well nourished, in no acute distress. Head: Normocephalic, atraumatic, sclera non-icteric, no xanthomas, nares are without discharge. Neck: No apparent masses Lungs: Normal respirations with some wheezes, no rhonchi, no rales , few crackles   Heart: Irregular rate and rhythm, normal S1 S2, no murmur, no rub, no gallop, PMI is normal size and placement, carotid upstroke normal without bruit, jugular venous pressure normal Abdomen: Soft, non-tender, non-distended with normoactive bowel sounds. No hepatosplenomegaly. Abdominal aorta is normal size without bruit Extremities: Trace to 1+ edema, no clubbing, no cyanosis, no ulcers,  Peripheral: 2+ radial, 2+ femoral, 2+ dorsal pedal pulses Neuro: Alert and oriented. Moves all extremities spontaneously. Psych:  Responds to questions appropriately with a normal affect.   Intake/Output Summary (Last 24 hours) at 12/12/16 0750 Last data filed at 12/12/16 [id number]  Gross per 24 hour  Intake            489.8 ml  Output              3125 ml  Net          -2635.2 ml    Inpatient Medications:  . aspirin EC  81 mg Oral Daily  . atorvastatin  20 mg Oral Daily  . azithromycin  500 mg Oral Daily  . cholecalciferol  1,000 Units Oral Daily  . dabigatran  150 mg Oral BID  . guaiFENesin  600 mg Oral BID   And  . dextromethorphan  30 mg Oral BID  . digoxin  0.25 mg Oral Daily  . diltiazem  120 mg Oral Daily  . diltiazem  5 mg Intravenous Once  . insulin aspart  0-15 Units Subcutaneous TID WC  . insulin aspart  0-5 Units Subcutaneous QHS  . insulin glargine  10 Units Subcutaneous Daily  . lisinopril  5 mg Oral Daily  . methylPREDNISolone (SOLU-MEDROL) injection  60 mg Intravenous Q12H  . metoprolol tartrate  25 mg Oral BID  . mometasone-formoterol  2 puff Inhalation BID  . montelukast  10 mg Oral QHS  . multivitamin with minerals  1 tablet Oral Daily  . potassium chloride SA  20 mEq Oral Daily  . sodium chloride flush  3 mL Intravenous Q12H  . tiotropium  18 mcg Inhalation Daily  . torsemide  40 mg Oral Daily   Infusions:   Labs:  Recent Labs  12/10/16 1912 12/11/16 0022 12/12/16 0402  NA 138 142 137  K 4.3 3.9 4.1  CL 95* 98* 92*  CO2 36* 38* 39*  GLUCOSE 216* 103* 161*  BUN 17 16 20   CREATININE 0.95 0.69 0.77  CALCIUM 9.2 8.8* 8.4*  MG 1.8  --   --  Recent Labs  12/10/16 1912  AST 36  ALT 40  ALKPHOS 95  BILITOT 0.7  PROT 7.4  ALBUMIN 3.6    Recent Labs  12/10/16 1912 12/11/16 0022 12/11/16 0812  WBC 6.5 7.3 6.7  NEUTROABS 5.0 5.1  --   HGB 12.2 10.8* 11.6*  HCT 40.2 34.1* 37.1  MCV 90.4 88.1 89.4  PLT 234 212 221    Recent Labs  12/10/16 1912 12/11/16 0022  TROPONINI 0.10* 0.11*   Invalid input(s): POCBNP  Recent Labs  12/11/16 0812  HGBA1C 5.6     Weights: Filed Weights   12/10/16 2326 12/11/16 0520 12/12/16 0522  Weight: 75.1 kg (165 lb 8 oz) 74.5 kg (164 lb 4.8 oz) 75.3 kg (165 lb 14.4 oz)     Radiology/Studies:  Dg Chest 2 View  Result Date:  12/11/2016 CLINICAL DATA:  Shortness of breath and productive cough. EXAM: CHEST  2 VIEW COMPARISON:  Single-view of the chest 12/10/2016. PA and lateral chest 11/05/2016. CT chest 06/05/2016. FINDINGS: Right pleural effusion is unchanged. Right worse than left basilar airspace disease appears worsened. No pneumothorax. Heart size enlarged. Aortic atherosclerosis is noted. Remote bilateral rib fractures are seen. IMPRESSION: Worsened right greater than left basilar airspace disease could be due to increased atelectasis or pneumonia. Right pleural effusion is unchanged. Electronically Signed   By: Inge Rise M.D.   On: 12/11/2016 07:34   Dg Chest Portable 1 View  Result Date: 12/10/2016 CLINICAL DATA:  Productive cough and dyspnea x2 days EXAM: PORTABLE CHEST 1 VIEW COMPARISON:  11/05/2016 FINDINGS: Slight increase in moderate volume right pleural effusion with adjacent compressive atelectasis. Superimposed pneumonia not entirely excluded at the right lung base. Persistent interstitial edema consistent with CHF. Right heart border is obscured by the pleural effusion. There is aortic atherosclerosis involving the arch. Multiple old bilateral rib deformities consistent with fractures. IMPRESSION: Slight increase in moderate sized right effusion with persistent mild CHF. Compressive atelectasis noted at the right lung base. Superimposed pneumonia is not entirely excluded. Electronically Signed   By: Ashley Royalty M.D.   On: 12/10/2016 20:01     Assessment and Recommendation  70 y.o. female with the known essential hypertension mixed hyperlipidemia and obstructive pulmonary disease on 2 L with acute on chronic diastolic dysfunction congestive heart failure with edema and exacerbating paroxysmal nonvalvular atrial fibrillation with rapid ventricular rate now converted to atrial flutter and improved heart rate 1. Continue current medical regimen of diltiazem digoxin and metoprolol combination for heart rate  control of atrial flutter and possible spontaneous conversion to normal sinus rhythm 2. Improved access for further risk reduction in stroke with atrial fibrillation 3. Patient instructed on low sodium diet to reduce the possibility of further episodes of diastolic dysfunction heart failure with continuation of furosemide orally as an outpatient 4. High intensity cholesterol therapy for risk reduction coronary artery disease 5. Begin ambulation and follow for improvements of symptoms and need for adjustments of medication the possible discharge to home when feeling better with follow-up next week  Signed, Serafina Royals M.D. FACC

## 2016-12-12 NOTE — Care Management Important Message (Signed)
Important Message  Patient Details  Name: Elaine Decker MRN: [id number] Date of Birth: 1946-03-01   Medicare Important Message Given:  Yes    Shelbie Ammons, RN 12/12/2016, 7:18 AM

## 2016-12-13 DIAGNOSIS — J441 Chronic obstructive pulmonary disease with (acute) exacerbation: Secondary | ICD-10-CM

## 2016-12-13 DIAGNOSIS — R7989 Other specified abnormal findings of blood chemistry: Secondary | ICD-10-CM

## 2016-12-13 DIAGNOSIS — I4891 Unspecified atrial fibrillation: Secondary | ICD-10-CM

## 2016-12-13 DIAGNOSIS — R778 Other specified abnormalities of plasma proteins: Secondary | ICD-10-CM

## 2016-12-13 DIAGNOSIS — J9621 Acute and chronic respiratory failure with hypoxia: Secondary | ICD-10-CM

## 2016-12-13 DIAGNOSIS — E119 Type 2 diabetes mellitus without complications: Secondary | ICD-10-CM

## 2016-12-13 LAB — BASIC METABOLIC PANEL
ANION GAP: 6 (ref 5–15)
BUN: 29 mg/dL — ABNORMAL HIGH (ref 6–20)
CHLORIDE: 89 mmol/L — AB (ref 101–111)
CO2: 39 mmol/L — AB (ref 22–32)
Calcium: 8.4 mg/dL — ABNORMAL LOW (ref 8.9–10.3)
Creatinine, Ser: 0.83 mg/dL (ref 0.44–1.00)
GFR calc non Af Amer: >60 mL/min (ref >60)
GLUCOSE: 153 mg/dL — AB (ref 65–99)
POTASSIUM: 4.2 mmol/L (ref 3.5–5.1)
Sodium: 134 mmol/L — ABNORMAL LOW (ref 135–145)

## 2016-12-13 LAB — GLUCOSE, CAPILLARY
GLUCOSE-CAPILLARY: 162 mg/dL — AB (ref 65–99)
Glucose-Capillary: 141 mg/dL — ABNORMAL HIGH (ref 65–99)

## 2016-12-13 MED ORDER — AZITHROMYCIN 500 MG PO TABS: 500.0000 mg | ORAL_TABLET | Freq: Every day | ORAL | 0 refills | Status: DC

## 2016-12-13 MED ORDER — PREDNISONE 10 MG (21) PO TBPK: 10.0000 mg | ORAL_TABLET | Freq: Every day | ORAL | 0 refills | Status: DC

## 2016-12-13 MED ORDER — GUAIFENESIN ER 600 MG PO TB12: 600.0000 mg | ORAL_TABLET | Freq: Two times a day (BID) | ORAL | 0 refills | Status: DC

## 2016-12-13 MED ORDER — DEXTROMETHORPHAN POLISTIREX ER 30 MG/5ML PO SUER: 30.0000 mg | Freq: Two times a day (BID) | ORAL | 0 refills | Status: DC

## 2016-12-13 NOTE — Progress Notes (Signed)
[facility] Cardiology [facility] Encounter Note  Patient: Elaine Decker / Admit Date: 12/10/2016 / Date of Encounter: 12/13/2016, 8:01 AM   Subjective: Patient feels much better today. No evidence of myocardial infarction. Heart failure symptoms of shortness of breath and edema improved. Heart rate now with atrial flutter. No change since yesterday  Review of Systems: Positive for: Shortness of breath and some mild weakness but improved Negative for: Vision change, hearing change, syncope, dizziness, nausea, vomiting,diarrhea, bloody stool, stomach pain, cough, congestion, diaphoresis, urinary frequency, urinary pain,skin lesions, skin rashes Others previously listed  Objective: Telemetry: Atrial flutter with controlled ventricular rate Physical Exam: Blood pressure (!) 146/71, pulse 65, temperature 97.8 F (36.6 C), temperature source Oral, resp. rate 18, height 5\' 3"  (1.6 m), weight 76.5 kg (168 lb 11.2 oz), SpO2 91 %. Body mass index is 29.88 kg/m. General: Well developed, well nourished, in no acute distress. Head: Normocephalic, atraumatic, sclera non-icteric, no xanthomas, nares are without discharge. Neck: No apparent masses Lungs: Normal respirations with some wheezes, no rhonchi, no rales , few crackles   Heart: Irregular rate and rhythm, normal S1 S2, no murmur, no rub, no gallop, PMI is normal size and placement, carotid upstroke normal without bruit, jugular venous pressure normal Abdomen: Soft, non-tender, non-distended with normoactive bowel sounds. No hepatosplenomegaly. Abdominal aorta is normal size without bruit Extremities: Trace to 1+ edema, no clubbing, no cyanosis, no ulcers,  Peripheral: 2+ radial, 2+ femoral, 2+ dorsal pedal pulses Neuro: Alert and oriented. Moves all extremities spontaneously. Psych:  Responds to questions appropriately with a normal affect.   Intake/Output Summary (Last 24 hours) at 12/13/16 0801 Last data filed at 12/13/16 0519  Gross  per 24 hour  Intake              600 ml  Output             3650 ml  Net            -3050 ml    Inpatient Medications:  . aspirin EC  81 mg Oral Daily  . atorvastatin  20 mg Oral Daily  . azithromycin  500 mg Oral Daily  . cholecalciferol  1,000 Units Oral Daily  . dabigatran  150 mg Oral BID  . guaiFENesin  600 mg Oral BID   And  . dextromethorphan  30 mg Oral BID  . digoxin  0.25 mg Oral Daily  . diltiazem  120 mg Oral Daily  . diltiazem  5 mg Intravenous Once  . insulin aspart  0-15 Units Subcutaneous TID WC  . insulin aspart  0-5 Units Subcutaneous QHS  . insulin glargine  10 Units Subcutaneous Daily  . lisinopril  5 mg Oral Daily  . methylPREDNISolone (SOLU-MEDROL) injection  40 mg Intravenous Q12H  . metoprolol tartrate  25 mg Oral BID  . mometasone-formoterol  2 puff Inhalation BID  . montelukast  10 mg Oral QHS  . multivitamin with minerals  1 tablet Oral Daily  . potassium chloride SA  20 mEq Oral Daily  . sodium chloride flush  3 mL Intravenous Q12H  . tiotropium  18 mcg Inhalation Daily  . torsemide  40 mg Oral Daily   Infusions:   Labs:  Recent Labs  12/10/16 1912  12/12/16 0402 12/13/16 0406  NA 138  < > 137 134*  K 4.3  < > 4.1 4.2  CL 95*  < > 92* 89*  CO2 36*  < > 39* 39*  GLUCOSE 216*  < >  161* 153*  BUN 17  < > 20 29*  CREATININE 0.95  < > 0.77 0.83  CALCIUM 9.2  < > 8.4* 8.4*  MG 1.8  --   --   --   < > = values in this interval not displayed.  Recent Labs  12/10/16 1912  AST 36  ALT 40  ALKPHOS 95  BILITOT 0.7  PROT 7.4  ALBUMIN 3.6    Recent Labs  12/10/16 1912 12/11/16 0022 12/11/16 0812  WBC 6.5 7.3 6.7  NEUTROABS 5.0 5.1  --   HGB 12.2 10.8* 11.6*  HCT 40.2 34.1* 37.1  MCV 90.4 88.1 89.4  PLT 234 212 221    Recent Labs  12/10/16 1912 12/11/16 0022  TROPONINI 0.10* 0.11*   Invalid input(s): POCBNP  Recent Labs  12/11/16 0812  HGBA1C 5.6     Weights: Filed Weights   12/11/16 0520 12/12/16 0522 12/13/16  0501  Weight: 74.5 kg (164 lb 4.8 oz) 75.3 kg (165 lb 14.4 oz) 76.5 kg (168 lb 11.2 oz)     Radiology/Studies:  Dg Chest 2 View  Result Date: 12/11/2016 CLINICAL DATA:  Shortness of breath and productive cough. EXAM: CHEST  2 VIEW COMPARISON:  Single-view of the chest 12/10/2016. PA and lateral chest 11/05/2016. CT chest 06/05/2016. FINDINGS: Right pleural effusion is unchanged. Right worse than left basilar airspace disease appears worsened. No pneumothorax. Heart size enlarged. Aortic atherosclerosis is noted. Remote bilateral rib fractures are seen. IMPRESSION: Worsened right greater than left basilar airspace disease could be due to increased atelectasis or pneumonia. Right pleural effusion is unchanged. Electronically Signed   By: Inge Rise M.D.   On: 12/11/2016 07:34   Dg Chest Portable 1 View  Result Date: 12/10/2016 CLINICAL DATA:  Productive cough and dyspnea x2 days EXAM: PORTABLE CHEST 1 VIEW COMPARISON:  11/05/2016 FINDINGS: Slight increase in moderate volume right pleural effusion with adjacent compressive atelectasis. Superimposed pneumonia not entirely excluded at the right lung base. Persistent interstitial edema consistent with CHF. Right heart border is obscured by the pleural effusion. There is aortic atherosclerosis involving the arch. Multiple old bilateral rib deformities consistent with fractures. IMPRESSION: Slight increase in moderate sized right effusion with persistent mild CHF. Compressive atelectasis noted at the right lung base. Superimposed pneumonia is not entirely excluded. Electronically Signed   By: Ashley Royalty M.D.   On: 12/10/2016 20:01     Assessment and Recommendation  70 y.o. female with the known essential hypertension mixed hyperlipidemia and obstructive pulmonary disease on 2 L with acute on chronic diastolic dysfunction congestive heart failure with edema and exacerbating paroxysmal nonvalvular atrial flutter with rapid ventricular rate now  much  better controlled 1. Continue current medical regimen of diltiazem digoxin and metoprolol combination for heart rate control of atrial flutter and possible spontaneous conversion to normal sinus rhythm 2. Continue present anticoagulation for further risk reduction of cardiovascular event in stroke with atrial fibrillation 3. Patient instructed on low sodium diet to reduce the possibility of further episodes of diastolic dysfunction heart failure with continuation of furosemide orally as an outpatient 4. High intensity cholesterol therapy for risk reduction coronary artery disease 5. Begin ambulation and follow for improvements of symptoms and need for adjustments of medication the possible discharge to home when feeling better with follow-up next week 6. Call if further questions  Signed, Serafina Royals M.D. FACC

## 2016-12-13 NOTE — Discharge Summary (Signed)
[facility] [place] NAME: Elaine Decker    MR#:  [id number]  DATE OF BIRTH:  10-04-1946  DATE OF ADMISSION:  12/10/2016 ADMITTING PHYSICIAN: Elaine Glassing Hugelmeyer, DO  DATE OF DISCHARGE: No discharge date for patient encounter.  PRIMARY CARE PHYSICIAN: Singh,Jasmine, MD     ADMISSION DIAGNOSIS:  CHF (congestive heart failure) (HCC) [I50.9] Elevated troponin [R74.8] COPD exacerbation (HCC) [J44.1] Acute on chronic congestive heart failure, unspecified congestive heart failure type ([place]) [I50.9]  DISCHARGE DIAGNOSIS:  Principal Problem:   Acute exacerbation of CHF (congestive heart failure) (HCC) Active Problems:   Acute on chronic respiratory failure with hypoxia (HCC)   Elevated troponin   Atrial fibrillation with RVR (HCC)   COPD exacerbation (HCC)   Diabetes ([place])   SECONDARY DIAGNOSIS:   Past Medical History:  Diagnosis Date  . A-fib ([place])   . Asthma   . CHF (congestive heart failure) ([place])   . CKD (chronic kidney disease)   . COPD (chronic obstructive pulmonary disease) ([place])   . HLD (hyperlipidemia)   . Hypertension     .pro HOSPITAL COURSE:   Patient is 70 year old female with past medical history significant for history of A. fib, asthma, CHF, CAD, COPD, hypertension, hyperlipidemia, who presents to the hospital with complaints of shortness of breath. She described shortness of breath, worsening over the past one week, accompanied by cough and dyspnea on exertion. She became acutely worse on the day of admission and was admitted. She complained of associated fevers and chills. Her chest x-ray revealed right-sided pleural effusion, CHF, atelectasis versus infiltrate in the right lower lung. Patient was admitted to the hospital, diuresed with diuretics, initiated on antibiotic therapy for possible pneumonia. Her labs revealed normal white blood cell count, mild anemia, elevated beta natruretic peptide to almost 1500.  Mild elevation of troponin. With conservative therapy, patient's condition improved and she was stable to be discharged home. Patient was seen by cardiologist and recommended to continue digoxin, diltiazem, metoprolol for  A. Fib. She was recommended to continue torsemide and potassium supplementation, lisinopril for congestive heart failure.  Discussion by problem: 1. Acute on chronic diastolic congestive heart failure Treated with Lasix, diuresed more than 9 L during her stay in the hospital time, the patient was advised to continue Demadex, potassium, lisinopril -Strictintake/output, daily weight. Patient was advised to take low salt intake, follow weight at home closely  #2 Acute on chronic respiratory failure with hypoxia. Weaned to her baseline oxygen 2 L [name]. Oxygen saturation is good at 95-97 % today  #3. Elevated troponin, secondary to demand ischemia No further intervention of minimal troponin elevation more consistent with demand ischemia rather than acute coronary syndrome per Dr. Nehemiah Massed.  #4 Atrial fibrillation with rapid ventricular response -Continue diltiazem and digoxin, metoprolol resumed Pradaxa.  #5. COPD/asthma Taper oral steroids  -Continue Spiriva and Singulair - Nebulizers, O2 therapy and expectorants as needed. Xopenex as ordered. Instead of doing an abscess due to tachycardia  #6.Hyperglycemia, hemoglobin A1c 5.6, no diabetes, discontinue insulin supplementation  #7. History of hyperlipidemia-continue Lipitor  #8. History of hypertension-continue lisinopril, Cardizem, metoprolol, blood pressure is relatively well controlled DISCHARGE CONDITIONS:   stable  CONSULTS OBTAINED:  Treatment Team:  Corey Skains, MD  DRUG ALLERGIES:  No Known Allergies  DISCHARGE MEDICATIONS:   Current Discharge Medication List    START taking these medications   Details  azithromycin (ZITHROMAX) 500 MG tablet Take 1 tablet (500 mg total) by mouth daily.  Qty:  3 tablet, Refills: 0    dextromethorphan (DELSYM) 30 MG/5ML liquid Take 5 mLs (30 mg total) by mouth 2 (two) times daily. Qty: 89 mL, Refills: 0    guaiFENesin (MUCINEX) 600 MG 12 hr tablet Take 1 tablet (600 mg total) by mouth 2 (two) times daily. Qty: 20 tablet, Refills: 0    predniSONE (STERAPRED UNI-PAK 21 TAB) 10 MG (21) TBPK tablet Take 1 tablet (10 mg total) by mouth daily. Please take 6 pills in the morning on the day one, then taper by 1 pill daily until finished, thank you Qty: 21 tablet, Refills: 0      CONTINUE these medications which have NOT CHANGED   Details  acetaminophen (TYLENOL) 500 MG tablet Take 500-1,000 mg by mouth daily.    albuterol (PROVENTIL HFA;VENTOLIN HFA) 108 (90 Base) MCG/ACT inhaler Inhale 2 puffs into the lungs every 4 (four) hours as needed for wheezing or shortness of breath.    atorvastatin (LIPITOR) 20 MG tablet Take 20 mg by mouth daily.    cholecalciferol (VITAMIN D) 1000 UNITS tablet Take 1,000 Units by mouth daily.    dabigatran (PRADAXA) 150 MG CAPS capsule Take 150 mg by mouth 2 (two) times daily.    digoxin (LANOXIN) 0.25 MG tablet Take 1 tablet (0.25 mg total) by mouth daily. Qty: 30 tablet, Refills: 5    diltiazem (DILACOR XR) 120 MG 24 hr capsule Take 1 capsule (120 mg total) by mouth daily. Qty: 30 capsule, Refills: 1    Fluticasone-Salmeterol (ADVAIR) 250-50 MCG/DOSE AEPB Inhale 1 puff into the lungs 2 (two) times daily.    ipratropium-albuterol (DUONEB) 0.5-2.5 (3) MG/3ML SOLN Take 3 mLs by nebulization every 6 (six) hours. Qty: 360 mL, Refills: 1    lisinopril (PRINIVIL,ZESTRIL) 5 MG tablet TAKE 1 TABLET(5 MG) BY MOUTH DAILY Qty: 30 tablet, Refills: 5    metoprolol tartrate (LOPRESSOR) 25 MG tablet Take 25 mg by mouth 2 (two) times daily. Take 25mg  AM and 50mg  PM    montelukast (SINGULAIR) 10 MG tablet Take 10 mg by mouth at bedtime.  Refills: 5    Multiple Vitamin (MULTIVITAMIN) tablet Take 1 tablet by mouth daily.     potassium chloride SA (K-DUR,KLOR-CON) 20 MEQ tablet Take 1 tablet (20 mEq total) by mouth daily. Qty: 30 tablet, Refills: 1    tiotropium (SPIRIVA) 18 MCG inhalation capsule Place 18 mcg into inhaler and inhale daily.    torsemide (DEMADEX) 20 MG tablet Take 2 tablets (40 mg total) by mouth daily. Qty: 60 tablet, Refills: 3         DISCHARGE INSTRUCTIONS:    The patient is to follow-up with primary care physician within 1 week   If you experience worsening of your admission symptoms, develop shortness of breath, life threatening emergency, suicidal or homicidal thoughts you must seek medical attention immediately by calling 911 or calling your MD immediately  if symptoms less severe.  You Must read complete instructions/literature along with all the possible adverse reactions/side effects for all the Medicines you take and that have been prescribed to you. Take any new Medicines after you have completely understood and accept all the possible adverse reactions/side effects.   Please note  You were cared for by a hospitalist during your hospital stay. If you have any questions about your discharge medications or the care you received while you were in the hospital after you are discharged, you can call the unit and asked to speak with the hospitalist on call  if the hospitalist that took care of you is not available. Once you are discharged, your primary care physician will handle any further medical issues. Please note that NO REFILLS for any discharge medications will be authorized once you are discharged, as it is imperative that you return to your primary care physician (or establish a relationship with a primary care physician if you do not have one) for your aftercare needs so that they can reassess your need for medications and monitor your lab values.    Today   CHIEF COMPLAINT:   Chief Complaint  Patient presents with  . Shortness of Breath    HISTORY OF PRESENT ILLNESS:   Rhiannon Brien  is a 70 y.o. female with a known history of A. fib, asthma, CHF, CAD, COPD, hypertension, hyperlipidemia, who presents to the hospital with complaints of shortness of breath. She described shortness of breath, worsening over the past one week, accompanied by cough and dyspnea on exertion. She became acutely worse on the day of admission and was admitted. She complained of associated fevers and chills. Her chest x-ray revealed right-sided pleural effusion, CHF, atelectasis versus infiltrate in the right lower lung. Patient was admitted to the hospital, diuresed with diuretics, initiated on antibiotic therapy for possible pneumonia. Her labs revealed normal white blood cell count, mild anemia, elevated beta natruretic peptide to almost 1500. Mild elevation of troponin. With conservative therapy, patient's condition improved and she was stable to be discharged home. Patient was seen by cardiologist and recommended to continue digoxin, diltiazem, metoprolol for  A. Fib. She was recommended to continue torsemide and potassium supplementation, lisinopril for congestive heart failure.  Discussion by problem: 1. Acute on chronic diastolic congestive heart failure Treated with Lasix, diuresed more than 9 L during her stay in the hospital time, the patient was advised to continue Demadex, potassium, lisinopril -Strictintake/output, daily weight. Patient was advised to take low salt intake, follow weight at home closely  #2 Acute on chronic respiratory failure with hypoxia. Weaned to her baseline oxygen 2 L . Oxygen saturation is good at 95-97 % today  #3. Elevated troponin, secondary to demand ischemia No further intervention of minimal troponin elevation more consistent with demand ischemia rather than acute coronary syndrome per Dr. Nehemiah Massed.  #4 Atrial fibrillation with rapid ventricular response -Continue diltiazem and digoxin, metoprolol resumed Pradaxa.  #5. COPD/asthma Taper  oral steroids  -Continue Spiriva and Singulair - Nebulizers, O2 therapy and expectorants as needed. Xopenex as ordered. Instead of doing an abscess due to tachycardia  #6.Hyperglycemia, hemoglobin A1c 5.6, no diabetes, discontinue insulin supplementation  #7. History of hyperlipidemia-continue Lipitor  #8. History of hypertension-continue lisinopril, Cardizem, metoprolol, blood pressure is relatively well controlled    VITAL SIGNS:  Blood pressure (!) 142/69, pulse 63, temperature 97.3 F (36.3 C), temperature source Oral, resp. rate 18, height 5\' 3"  (1.6 m), weight 76.5 kg (168 lb 11.2 oz), SpO2 97 %.  I/O:   Intake/Output Summary (Last 24 hours) at 12/13/16 1417 Last data filed at 12/13/16 1311  Gross per 24 hour  Intake              480 ml  Output             4900 ml  Net            -4420 ml    PHYSICAL EXAMINATION:  GENERAL:  70 y.o.-year-old patient lying in the bed with no acute distress.  EYES: Pupils equal, round, reactive to light and  accommodation. No scleral icterus. Extraocular muscles intact.  HEENT: Head atraumatic, normocephalic. Oropharynx and nasopharynx clear.  NECK:  Supple, no jugular venous distention. No thyroid enlargement, no tenderness.  LUNGS: Normal breath sounds bilaterally, no wheezing, rales,rhonchi or crepitation. No use of accessory muscles of respiration.  CARDIOVASCULAR: S1, S2 normal. No murmurs, rubs, or gallops.  ABDOMEN: Soft, non-tender, non-distended. Bowel sounds present. No organomegaly or mass.  EXTREMITIES: No pedal edema, cyanosis, or clubbing.  NEUROLOGIC: Cranial nerves II through XII are intact. Muscle strength 5/5 in all extremities. Sensation intact. Gait not checked.  PSYCHIATRIC: The patient is alert and oriented x 3.  SKIN: No obvious rash, lesion, or ulcer.   DATA REVIEW:   CBC  Recent Labs Lab 12/11/16 0812  WBC 6.7  HGB 11.6*  HCT 37.1  PLT 221    Chemistries   Recent Labs Lab 12/10/16 1912   12/13/16 0406  NA 138  < > 134*  K 4.3  < > 4.2  CL 95*  < > 89*  CO2 36*  < > 39*  GLUCOSE 216*  < > 153*  BUN 17  < > 29*  CREATININE 0.95  < > 0.83  CALCIUM 9.2  < > 8.4*  MG 1.8  --   --   AST 36  --   --   ALT 40  --   --   ALKPHOS 95  --   --   BILITOT 0.7  --   --   < > = values in this interval not displayed.  Cardiac Enzymes  Recent Labs Lab 12/11/16 0022  TROPONINI 0.11*    Microbiology Results  Results for orders placed or performed during the hospital encounter of 03/18/16  MRSA PCR Screening     Status: None   Collection Time: 03/19/16  3:52 AM  Result Value Ref Range Status   MRSA by PCR NEGATIVE NEGATIVE Final    Comment:        The GeneXpert MRSA Assay (FDA approved for NASAL specimens only), is one component of a comprehensive MRSA colonization surveillance program. It is not intended to diagnose MRSA infection nor to guide or monitor treatment for MRSA infections.   C difficile quick scan w PCR reflex     Status: Abnormal   Collection Time: 03/19/16  8:10 PM  Result Value Ref Range Status   C Diff antigen POSITIVE (A) NEGATIVE Final   C Diff toxin POSITIVE (A) NEGATIVE Final   C Diff interpretation   Final    Positive for toxigenic C. difficile, active toxin production present.    Comment: CRITICAL RESULT CALLED TO, READ BACK BY AND VERIFIED WITH: [facility] WHITE AT 2142 03/19/16 KLK     RADIOLOGY:  No results found.  EKG:   Orders placed or performed during the hospital encounter of 12/10/16  . EKG 12-Lead  . EKG 12-Lead      Management plans discussed with the patient, family and they are in agreement.  CODE STATUS:     Code Status Orders        Start     Ordered   12/10/16 2329  Full code  Continuous     12/10/16 2328    Code Status History    Date Active Date Inactive Code Status Order ID Comments User Context   11/05/2016  6:00 PM 11/09/2016  4:50 PM Full Code [id number]  Lytle Butte, MD ED   07/02/2016  3:12 PM  07/06/2016  8:27 PM Full  Code [id number]  Allyne Gee, MD Inpatient   03/19/2016  2:41 AM 03/26/2016  3:56 PM Full Code [id number]  Saundra Shelling, MD Inpatient   01/19/2016  9:13 PM 01/24/2016  7:21 PM Full Code [id number]  Lance Coon, MD Inpatient   11/28/2015 11:41 AM 11/30/2015  5:51 PM Full Code [id number]  Loletha Grayer, MD ED      TOTAL TIME TAKING CARE OF THIS PATIENT: 40 minutes.    Theodoro Grist M.D on 12/13/2016 at 2:17 PM  Between 7am to 6pm - Pager - [phone]  After 6pm go to [email or web address] - password [email or web address] [place] [facility]  Office  [phone]  CC: Primary care physician; Glendon Axe, MD

## 2016-12-13 NOTE — Care Management (Signed)
Patient admits to noncompliance with her diuretic administration due to her working schedule. Patient shared this information with this CM on a previous admission and she now says that she is "for sure" going to retire from her job.  Says that continuing to work is not worth further jeopardizing her health.  She is not home bound and does not qualify for home health services.  She is followed by the heart failure clinic. Denies issues obtaining her medications

## 2016-12-13 NOTE — Progress Notes (Signed)
Discharged to home. Transported by a friend.  Reviewed new  Medication.  Reviewed when to call the physician re COPD.

## 2016-12-28 ENCOUNTER — Encounter: Payer: Self-pay | Admitting: Family

## 2016-12-28 ENCOUNTER — Ambulatory Visit: Payer: Medicare Other | Attending: Family | Admitting: Family

## 2016-12-28 VITALS — BP 106/89 | HR 69 | Resp 20 | Ht 63.0 in | Wt 143.1 lb

## 2016-12-28 DIAGNOSIS — E785 Hyperlipidemia, unspecified: Secondary | ICD-10-CM | POA: Diagnosis not present

## 2016-12-28 DIAGNOSIS — Z8249 Family history of ischemic heart disease and other diseases of the circulatory system: Secondary | ICD-10-CM | POA: Insufficient documentation

## 2016-12-28 DIAGNOSIS — I4891 Unspecified atrial fibrillation: Secondary | ICD-10-CM | POA: Insufficient documentation

## 2016-12-28 DIAGNOSIS — I130 Hypertensive heart and chronic kidney disease with heart failure and stage 1 through stage 4 chronic kidney disease, or unspecified chronic kidney disease: Secondary | ICD-10-CM | POA: Diagnosis not present

## 2016-12-28 DIAGNOSIS — I480 Paroxysmal atrial fibrillation: Secondary | ICD-10-CM

## 2016-12-28 DIAGNOSIS — I272 Pulmonary hypertension, unspecified: Secondary | ICD-10-CM | POA: Insufficient documentation

## 2016-12-28 DIAGNOSIS — G4733 Obstructive sleep apnea (adult) (pediatric): Secondary | ICD-10-CM

## 2016-12-28 DIAGNOSIS — I10 Essential (primary) hypertension: Secondary | ICD-10-CM

## 2016-12-28 DIAGNOSIS — Z7901 Long term (current) use of anticoagulants: Secondary | ICD-10-CM | POA: Diagnosis not present

## 2016-12-28 DIAGNOSIS — N189 Chronic kidney disease, unspecified: Secondary | ICD-10-CM | POA: Diagnosis not present

## 2016-12-28 DIAGNOSIS — J449 Chronic obstructive pulmonary disease, unspecified: Secondary | ICD-10-CM | POA: Diagnosis not present

## 2016-12-28 DIAGNOSIS — I5022 Chronic systolic (congestive) heart failure: Secondary | ICD-10-CM | POA: Diagnosis present

## 2016-12-28 DIAGNOSIS — Z87891 Personal history of nicotine dependence: Secondary | ICD-10-CM | POA: Insufficient documentation

## 2016-12-28 MED ORDER — METOPROLOL TARTRATE 25 MG PO TABS: 25.0000 mg | ORAL_TABLET | ORAL | 5 refills | Status: DC

## 2016-12-28 NOTE — Progress Notes (Signed)
Patient ID: Elaine Decker, female    DOB: [birth year], 71 y.o.   MRN: [id number]  HPI  Elaine Decker is a 70 y/o female with a history of HTN, hyperlipidemia, COPD, obstructive sleep apnea, CKD, asthma, atrial fibrillation, prior tobacco use and chronic heart failure.  Last echo was done 11/06/16 and showed an EF of 60-65% with mild AR, moderate MR and severe TR. Mild to moderate pulmonary HTN was also present. EF has improved greatly from echo done 01/21/16 which showed an EF of 25-30%.  Most recent admission was 12/10/16 for HF exacerbation along with COPD. Given IV diuretics and antibiotics. Cardiology consult was obtained. Previous admission was on 11/05/16 after being seen at outside ED on that same day with acute exacerbation of heart failure. She had not been taking her diuretic as prescribed. Was treated with IV diuretics and lost approximately 12L of fluid. Prior admission was on 07/02/16 for acute on chronic respiratory failure with hypoxemia secondary to COPD and pneumonia. Was treated with IV steroids and antibiotics. Was discharged after 4 days.   She presents today for a follow-up visit with fatigue which is improving. Denies any shortness of breath or welling in her legs. She has some intermittent episodes where her heart rate gets in the 120-130's but then it quickly comes back down. Is walking the dog 1-2 times daily for 5 minutes at a time.   Past Medical History:  Diagnosis Date  . A-fib ([place])   . Asthma   . CHF (congestive heart failure) ([place])   . CKD (chronic kidney disease)   . COPD (chronic obstructive pulmonary disease) ([place])   . HLD (hyperlipidemia)   . Hypertension    Past Surgical History:  Procedure Laterality Date  . FLEXIBLE BRONCHOSCOPY N/A 02/01/2016   Procedure: FLEXIBLE BRONCHOSCOPY;  Surgeon: Allyne Gee, MD;  Location: [facility] ORS;  Service: Pulmonary;  Laterality: N/A;  . Surgery on the neck     Family History  Problem Relation Age of Onset  . CAD Mother   .  Lung cancer Father   . Arrhythmia Brother    Social History  Substance Use Topics  . Smoking status: Former Smoker    Packs/day: 2.00    Years: 50.00    Quit date: 11/27/2015  . Smokeless tobacco: Never Used  . Alcohol use No   No Known Allergies  Prior to Admission medications   Medication Sig Start Date End Date Taking? Authorizing Provider  acetaminophen (TYLENOL) 500 MG tablet Take 500-1,000 mg by mouth daily.   Yes Historical Provider, MD  albuterol (PROVENTIL HFA;VENTOLIN HFA) 108 (90 Base) MCG/ACT inhaler Inhale 2 puffs into the lungs every 4 (four) hours as needed for wheezing or shortness of breath.   Yes Historical Provider, MD  atorvastatin (LIPITOR) 20 MG tablet Take 20 mg by mouth daily.   Yes Historical Provider, MD  cholecalciferol (VITAMIN D) 1000 UNITS tablet Take 1,000 Units by mouth daily.   Yes Historical Provider, MD  dabigatran (PRADAXA) 150 MG CAPS capsule Take 150 mg by mouth 2 (two) times daily.   Yes Historical Provider, MD  digoxin (LANOXIN) 0.25 MG tablet Take 1 tablet (0.25 mg total) by mouth daily. 06/26/16  Yes Alisa Graff, FNP  diltiazem (DILACOR XR) 120 MG 24 hr capsule Take 1 capsule (120 mg total) by mouth daily. 11/09/16  Yes Henreitta Leber, MD  Fluticasone-Salmeterol (ADVAIR) 250-50 MCG/DOSE AEPB Inhale 1 puff into the lungs 2 (two) times daily.   Yes  Historical Provider, MD  ipratropium-albuterol (DUONEB) 0.5-2.5 (3) MG/3ML SOLN Take 3 mLs by nebulization every 6 (six) hours. Patient taking differently: Take 3 mLs by nebulization every 6 (six) hours as needed (for wheezing/shortness of breath).  11/30/15  Yes Fritzi Mandes, MD  lisinopril (PRINIVIL,ZESTRIL) 5 MG tablet TAKE 1 TABLET(5 MG) BY MOUTH DAILY 07/25/16  Yes Alisa Graff, FNP  metoprolol tartrate (LOPRESSOR) 25 MG tablet Take 1 tablet (25 mg total) by mouth as directed. Take 25mg  AM and 50mg  PM 12/28/16  Yes Alisa Graff, FNP  montelukast (SINGULAIR) 10 MG tablet Take 10 mg by mouth at  bedtime.    Yes Historical Provider, MD  Multiple Vitamin (MULTIVITAMIN) tablet Take 1 tablet by mouth daily.   Yes Historical Provider, MD  potassium chloride SA (K-DUR,KLOR-CON) 20 MEQ tablet Take 1 tablet (20 mEq total) by mouth daily. 11/09/16  Yes Henreitta Leber, MD  tiotropium (SPIRIVA) 18 MCG inhalation capsule Place 18 mcg into inhaler and inhale daily.   Yes Historical Provider, MD  torsemide (DEMADEX) 20 MG tablet Take 2 tablets (40 mg total) by mouth daily. 11/28/16 12/28/16 Yes Alisa Graff, FNP     Review of Systems  Constitutional: Positive for fatigue (better). Negative for appetite change.  HENT: Negative for congestion, postnasal drip and sore throat.   Eyes: Negative.   Respiratory: Negative for cough, chest tightness and shortness of breath.   Cardiovascular: Positive for palpitations (at times). Negative for chest pain and leg swelling.  Gastrointestinal: Negative for abdominal distention and abdominal pain.  Endocrine: Negative.   Genitourinary: Negative.   Musculoskeletal: Negative for back pain and neck pain.  Skin: Negative.   Allergic/Immunologic: Negative.   Neurological: Negative for dizziness and light-headedness.  Hematological: Negative for adenopathy. Does not bruise/bleed easily.  Psychiatric/Behavioral: Positive for sleep disturbance (wearing CPAP (not fitting well) and oxygen). Negative for dysphoric mood. The patient is not nervous/anxious.    Vitals:   12/28/16 0910  BP: 106/89  Pulse: 69  Resp: 20  SpO2: 97%  Weight: 143 lb 2 oz (64.9 kg)  Height: 5\' 3"  (1.6 m)   Wt Readings from Last 3 Encounters:  12/28/16 143 lb 2 oz (64.9 kg)  12/13/16 168 lb 11.2 oz (76.5 kg)  11/28/16 160 lb (72.6 kg)   Lab Results  Component Value Date   CREATININE 0.83 12/13/2016   CREATININE 0.77 12/12/2016   CREATININE 0.69 12/11/2016    Physical Exam  Constitutional: She is oriented to person, place, and time. She appears well-developed and  well-nourished.  HENT:  Head: Normocephalic and atraumatic.  Eyes: Conjunctivae are normal. Pupils are equal, round, and reactive to light.  Neck: Normal range of motion. Neck supple. No JVD present.  Cardiovascular: Normal rate.  An irregular rhythm present.  Pulmonary/Chest: Effort normal. She has no wheezes. She has no rales.  Abdominal: Soft. She exhibits no distension. There is no tenderness.  Musculoskeletal: She exhibits no edema or tenderness.  Neurological: She is alert and oriented to person, place, and time.  Skin: Skin is warm and dry.  Psychiatric: She has a normal mood and affect. Her behavior is normal. Thought content normal.  Nursing note and vitals reviewed.    Assessment & Plan:  1: Chronic heart failure with reduced ejection fraction- - NYHA class II - EF has now normalized - on torsemide 40mg  daily - d/c weight 168 pounds and today she is 143.2 pounds. Reminded to call for an overnight weight gain of >2 pounds  or a weekly weight gain of >5 pounds.  - not adding salt but admits to eating saltier foods like popcorn. Encouraged to closely follow a 2000mg  sodium diet - drinking around 50-60 ounces of fluid daily - did receive the flu vaccine for this season - saw cardiologist ([place]) 09/25/16 and returns to him 01/23/17  2: HTN- - BP looks good today - saw PCP Candiss Norse) 10/02/16 and returns on 01/03/17  3: Atrial fibrillation- - currently rate controlled at this time - remains on dabigatran, digoxin, diltiazem and metoprolol - continues to have episodes of an elevated HR but it's infrequent. Currently taking metoprolol 25mg  AM and 50mg  PM. Instructed patient to monitor this at home and should this occur more often or last for longer periods of time, to call back so that we could increase her AM dose. HR was 69 in the office today.   4: Obstructive sleep apnea- - wearing oxygen at 2L around the clock - wears CPAP nightly but says that it's not fitting well. Due to  have a sleep study repeated on 01/02/17  Patient did not bring her medications nor a list. Each medication was verbally reviewed with the patient and she was encouraged to bring the bottles to every visit to confirm accuracy of list.  Return here in 2 months or sooner for any questions/problems before then.

## 2016-12-28 NOTE — Patient Instructions (Signed)
Continue weighing daily and call for an overnight weight gain of > 2 pounds or a weekly weight gain of >5 pounds. 

## 2017-01-02 ENCOUNTER — Ambulatory Visit: Admission: RE | Admit: 2017-01-02 | Payer: BLUE CROSS/BLUE SHIELD | Source: Ambulatory Visit

## 2017-01-08 ENCOUNTER — Other Ambulatory Visit (INDEPENDENT_AMBULATORY_CARE_PROVIDER_SITE_OTHER): Payer: Self-pay | Admitting: Vascular Surgery

## 2017-01-08 DIAGNOSIS — I712 Thoracic aortic aneurysm, without rupture, unspecified: Secondary | ICD-10-CM

## 2017-01-08 NOTE — Progress Notes (Unsigned)
ct 

## 2017-01-09 ENCOUNTER — Ambulatory Visit
Admission: RE | Admit: 2017-01-09 | Discharge: 2017-01-09 | Disposition: A | Discharge status: Home / Self Care | Payer: Medicare Other | Source: Ambulatory Visit | Attending: Vascular Surgery | Admitting: Vascular Surgery

## 2017-01-09 ENCOUNTER — Other Ambulatory Visit: Payer: Self-pay

## 2017-01-09 DIAGNOSIS — I712 Thoracic aortic aneurysm, without rupture, unspecified: Secondary | ICD-10-CM

## 2017-01-09 DIAGNOSIS — R918 Other nonspecific abnormal finding of lung field: Secondary | ICD-10-CM | POA: Insufficient documentation

## 2017-01-09 MED ORDER — DILTIAZEM HCL ER 120 MG PO CP24: 120.0000 mg | ORAL_CAPSULE | Freq: Every day | ORAL | 3 refills | Status: DC

## 2017-01-09 MED ORDER — POTASSIUM CHLORIDE CRYS ER 20 MEQ PO TBCR: 20.0000 meq | EXTENDED_RELEASE_TABLET | Freq: Every day | ORAL | 3 refills | Status: DC

## 2017-01-09 MED ORDER — IOPAMIDOL (ISOVUE-370) INJECTION 76%
100.0000 mL | Freq: Once | INTRAVENOUS | Status: AC | PRN
  Administered 2017-01-09: 85 mL via INTRAVENOUS

## 2017-01-10 ENCOUNTER — Ambulatory Visit (INDEPENDENT_AMBULATORY_CARE_PROVIDER_SITE_OTHER): Payer: Medicare Other | Admitting: Vascular Surgery

## 2017-01-10 ENCOUNTER — Other Ambulatory Visit: Payer: Self-pay | Admitting: Family

## 2017-01-10 ENCOUNTER — Encounter (INDEPENDENT_AMBULATORY_CARE_PROVIDER_SITE_OTHER): Payer: Self-pay | Admitting: Vascular Surgery

## 2017-01-10 VITALS — BP 125/74 | HR 82 | Resp 16 | Wt 144.0 lb

## 2017-01-10 DIAGNOSIS — I712 Thoracic aortic aneurysm, without rupture: Secondary | ICD-10-CM | POA: Diagnosis not present

## 2017-01-10 DIAGNOSIS — I5022 Chronic systolic (congestive) heart failure: Secondary | ICD-10-CM | POA: Diagnosis not present

## 2017-01-10 DIAGNOSIS — I7121 Aneurysm of the ascending aorta, without rupture: Secondary | ICD-10-CM

## 2017-01-10 DIAGNOSIS — I4891 Unspecified atrial fibrillation: Secondary | ICD-10-CM | POA: Diagnosis not present

## 2017-01-10 DIAGNOSIS — I10 Essential (primary) hypertension: Secondary | ICD-10-CM | POA: Diagnosis not present

## 2017-01-10 DIAGNOSIS — J449 Chronic obstructive pulmonary disease, unspecified: Secondary | ICD-10-CM

## 2017-01-10 DIAGNOSIS — J4489 Other specified chronic obstructive pulmonary disease: Secondary | ICD-10-CM

## 2017-01-11 DIAGNOSIS — I712 Thoracic aortic aneurysm, without rupture: Secondary | ICD-10-CM | POA: Insufficient documentation

## 2017-01-11 DIAGNOSIS — I7121 Aneurysm of the ascending aorta, without rupture: Secondary | ICD-10-CM | POA: Insufficient documentation

## 2017-01-11 NOTE — Progress Notes (Signed)
MRN : [id number]  Elaine Decker is a 71 y.o. ([birth year]) female who presents with chief complaint of  Chief Complaint  Patient presents with  . Follow-up  .  History of Present Illness: The patient is seen for follow up evaluation of TAA status post CTA. There were no problems or complications related to the CT scan. The patient denies interval development of abdominal or back pain. No new lower extremity pain or discoloration of the toes.   The patient has a history of coronary artery disease, no recent episodes of angina or shortness of breath. The patient denies interval anaurosis fugax. There is o recent history of TIA symptoms or focal motor deficits. The patient denies PAD or claudication symptoms. There is a history of hyperlipidemia which is being treated with a statin.   CT angiography of the abdomen and pelvis shows an ascending TAA <4.5 cm.  Current Meds  Medication Sig  . acetaminophen (TYLENOL) 500 MG tablet Take 500-1,000 mg by mouth daily.  Marland Kitchen albuterol (PROVENTIL HFA;VENTOLIN HFA) 108 (90 Base) MCG/ACT inhaler Inhale 2 puffs into the lungs every 4 (four) hours as needed for wheezing or shortness of breath.  Marland Kitchen atorvastatin (LIPITOR) 20 MG tablet Take 20 mg by mouth daily.  . cholecalciferol (VITAMIN D) 1000 UNITS tablet Take 1,000 Units by mouth daily.  . dabigatran (PRADAXA) 150 MG CAPS capsule Take 150 mg by mouth 2 (two) times daily.  . digoxin (LANOXIN) 0.25 MG tablet Take 1 tablet (0.25 mg total) by mouth daily.  Marland Kitchen diltiazem (DILACOR XR) 120 MG 24 hr capsule Take 1 capsule (120 mg total) by mouth daily.  . Fluticasone-Salmeterol (ADVAIR) 250-50 MCG/DOSE AEPB Inhale 1 puff into the lungs 2 (two) times daily.  Marland Kitchen ipratropium-albuterol (DUONEB) 0.5-2.5 (3) MG/3ML SOLN Take 3 mLs by nebulization every 6 (six) hours. (Patient taking differently: Take 3 mLs by nebulization every 6 (six) hours as needed (for wheezing/shortness of breath). )  . lisinopril (PRINIVIL,ZESTRIL) 5  MG tablet TAKE 1 TABLET(5 MG) BY MOUTH DAILY  . metoprolol tartrate (LOPRESSOR) 25 MG tablet Take 1 tablet (25 mg total) by mouth as directed. Take 25mg  AM and 50mg  PM  . montelukast (SINGULAIR) 10 MG tablet Take 10 mg by mouth at bedtime.   . Multiple Vitamin (MULTIVITAMIN) tablet Take 1 tablet by mouth daily.  . potassium chloride SA (K-DUR,KLOR-CON) 20 MEQ tablet Take 1 tablet (20 mEq total) by mouth daily.  Marland Kitchen tiotropium (SPIRIVA) 18 MCG inhalation capsule Place 18 mcg into inhaler and inhale daily.    Past Medical History:  Diagnosis Date  . A-fib ([place])   . Asthma   . CHF (congestive heart failure) ([place])   . CKD (chronic kidney disease)   . COPD (chronic obstructive pulmonary disease) ([place])   . HLD (hyperlipidemia)   . Hypertension     Past Surgical History:  Procedure Laterality Date  . FLEXIBLE BRONCHOSCOPY N/A 02/01/2016   Procedure: FLEXIBLE BRONCHOSCOPY;  Surgeon: Allyne Gee, MD;  Location: [facility] ORS;  Service: Pulmonary;  Laterality: N/A;  . Surgery on the neck      Social History Social History  Substance Use Topics  . Smoking status: Former Smoker    Packs/day: 2.00    Years: 50.00    Quit date: 11/27/2015  . Smokeless tobacco: Never Used  . Alcohol use No    Family History Family History  Problem Relation Age of Onset  . CAD Mother   . Lung cancer Father   .  Arrhythmia Brother   No family history of bleeding/clotting disorders, porphyria or autoimmune disease   No Known Allergies   REVIEW OF SYSTEMS (Negative unless checked)  Constitutional: [] Weight loss  [] Fever  [] Chills Cardiac: [] Chest pain   [] Chest pressure   [] Palpitations   [] Shortness of breath when laying flat   [] Shortness of breath with exertion. Vascular:  [] Pain in legs with walking   [] Pain in legs at rest  [] History of DVT   [] Phlebitis   [] Swelling in legs   [] Varicose veins   [] Non-healing ulcers Pulmonary:   [x] Uses home oxygen   [] Productive cough   [] Hemoptysis   [] Wheeze   [] COPD   [] Asthma Neurologic:  [] Dizziness   [] Seizures   [] History of stroke   [] History of TIA  [] Aphasia   [] Vissual changes   [] Weakness or numbness in arm   [] Weakness or numbness in leg Musculoskeletal:   [] Joint swelling   [] Joint pain   [] Low back pain Hematologic:  [] Easy bruising  [] Easy bleeding   [] Hypercoagulable state   [] Anemic Gastrointestinal:  [] Diarrhea   [] Vomiting  [] Gastroesophageal reflux/heartburn   [] Difficulty swallowing. Genitourinary:  [] Chronic kidney disease   [] Difficult urination  [] Frequent urination   [] Blood in urine Skin:  [] Rashes   [] Ulcers  Psychological:  [] History of anxiety   []  History of major depression.  Physical Examination  Vitals:   01/10/17 1619  BP: 125/74  Pulse: 82  Resp: 16  Weight: 144 lb (65.3 kg)   Body mass index is 25.51 kg/m. Gen: WD/WN, NAD Head: [place]/AT, No temporalis wasting.  Ear/Nose/Throat: Hearing grossly intact, nares w/o erythema or drainage, poor dentition Eyes: PER, EOMI, sclera nonicteric.  Neck: Supple, no masses.  No bruit or JVD.  Pulmonary:  distant air movement, crackles to auscultation bilaterally, no use of accessory muscles.  O2 by nasal cannula Cardiac: RRR, normal S1, S2, no Murmurs. Vascular:  Vessel Right Left  Radial Palpable Palpable  Ulnar Palpable Palpable  Brachial Palpable Palpable  Carotid Palpable Palpable  Femoral Palpable Palpable  Popliteal Palpable Palpable  PT Palpable Palpable  DP Palpable Palpable   Gastrointestinal: soft, non-distended. No guarding/no peritoneal signs.  Musculoskeletal: M/S 5/5 throughout.  No deformity or atrophy.  Neurologic: CN 2-12 intact. Pain and light touch intact in extremities.  Symmetrical.  Speech is fluent. Motor exam as listed above. Psychiatric: Judgment intact, Mood & affect appropriate for pt's clinical situation. Dermatologic: No rashes or ulcers noted.  No changes consistent with cellulitis. Lymph : No Cervical lymphadenopathy, no  lichenification or skin changes of chronic lymphedema.  CBC Lab Results  Component Value Date   WBC 6.7 12/11/2016   HGB 11.6 (L) 12/11/2016   HCT 37.1 12/11/2016   MCV 89.4 12/11/2016   PLT 221 12/11/2016    BMET    Component Value Date/Time   NA 134 (L) 12/13/2016 0406   K 4.2 12/13/2016 0406   CL 89 (L) 12/13/2016 0406   CO2 39 (H) 12/13/2016 0406   GLUCOSE 153 (H) 12/13/2016 0406   BUN 29 (H) 12/13/2016 0406   CREATININE 0.83 12/13/2016 0406   CALCIUM 8.4 (L) 12/13/2016 0406   GFRNONAA >60 12/13/2016 0406   GFRAA >60 12/13/2016 0406   CrCl cannot be calculated (Patient's most recent lab result is older than the maximum 21 days allowed.).  COAG Lab Results  Component Value Date   INR 1.41 12/11/2016   INR 1.83 07/06/2016   INR 1.29 07/05/2016    Radiology Ct Angio Chest Aorta W/cm &/  or Wo/cm  Result Date: 01/09/2017 CLINICAL DATA:  Followup thoracic aortic aneurysm. EXAM: CT ANGIOGRAPHY CHEST WITH CONTRAST TECHNIQUE: Multidetector CT imaging of the chest was performed using the standard protocol during bolus administration of intravenous contrast. Multiplanar CT image reconstructions and MIPs were obtained to evaluate the vascular anatomy. CONTRAST:  85 mL Isovue 370. COMPARISON:  06/05/2016, 12/23/2015 FINDINGS: Cardiovascular: The thoracic aorta again demonstrates atherosclerotic calcifications. Mild aneurysmal dilatation of the ascending portion is noted. At the level of the main pulmonary artery it measures 4.1 x 3.9 cm in greatest dimension. This is roughly stable from the prior exams dating back to January of 2017. The aorta measures 3.7 cm at the level of the sinus of Valsalva and 3.0 cm at the sino-tubular junction. Normal tapering in the thoracic aorta is noted. Mild atherosclerotic changes are noted in the descending aorta. No significant aneurysmal dilatation is noted. Pulmonary artery demonstrates a normal branching pattern. Cardiac structures are enlarged. Mild  coronary calcifications are seen. Mediastinum/Nodes: Thoracic inlet is within normal limits. Scattered small mediastinal lymph nodes are noted although not significant by size criteria. A few small scattered hilar lymph nodes are noted also not significant by size criteria. These are stable from previous exams. Lungs/Pleura: Diffuse emphysematous changes of lungs are noted. Small right pleural effusion is noted. There are multiple somewhat irregular nodular densities identified throughout both lungs. The largest of these on the right lies in the upper lobe measuring 14 mm best seen on image number 49 of series 4. Largest of these on the left measures almost 2 cm lying in the anterior aspect of the left upper lobe best seen on image number 71 of series 4. These are new from the prior exam and previously seen nodular changes are resolved when compared with the prior study. These waxing and waning nodular infiltrates again suggest a atypical infectious process. Upper Abdomen: No acute abnormality. Musculoskeletal: No chest wall abnormality. No acute or significant osseous findings. Review of the MIP images confirms the above findings. IMPRESSION: Stable appearing thoracic aortic aneurysm as described above. Recommend annual imaging followup by CTA or MRA. This recommendation follows 2010 ACCF/AHA/AATS/ACR/ASA/SCA/SCAI/SIR/STS/SVM Guidelines for the Diagnosis and Management of Patients with Thoracic Aortic Disease. Circulation. 2010; 121: [id number] Waxing and waning nodular infiltrates throughout both lungs worse on the left than the right. Previously seen nodular infiltrates have resolved from the prior exam of 05/2016. These changes again suggest an atypical infection. Short-term follow-up in 3 months is recommended to assess for resolution. No other focal abnormality is noted. Electronically Signed   By: Inez Catalina M.D.   On: 01/09/2017 11:26    Assessment/Plan 1. Ascending aortic aneurysm (HCC) No surgery or  intervention at this time. The patient has an asymptomatic abdominal aortic aneurysm that is less than 4 cm in maximal diameter.  I have discussed the natural history of thoracic aortic aneurysm and the small risk of rupture for aneurysm less than 5 cm in size.  However, as these small aneurysms tend to enlarge over time, continued surveillance with ultrasound or CT scan is mandatory.  I have also discussed optimizing medical management with hypertension and lipid control and the importance of abstinence from tobacco.  The patient is also encouraged to exercise a minimum of 30 minutes 4 times a week.  Should the patient develop new onset abdominal or back pain or signs of peripheral embolization they are instructed to seek medical attention immediately and to alert the physician providing care that they have an  aneurysm.  The patient voices their understanding. The patient will return in 12 months with an aortic duplex.  - CT ANGIO CHEST PE W OR WO CONTRAST; Future  2. Chronic systolic heart failure (HCC) Continue cardiac and antihypertensive medications as already ordered and reviewed, no changes at this time.  Continue statin as ordered and reviewed, no changes at this time  Nitrates PRN for chest pain   3. Rapid atrial fibrillation (HCC) Continue antiarrhythmic medications as already ordered, these medications have been reviewed and there are no changes at this time.   4. Essential hypertension Continue antihypertensive medications as already ordered, these medications have been reviewed and there are no changes at this time.   5. COPD (chronic obstructive pulmonary disease) with chronic bronchitis (HCC) Continue pulmonary medications and aerosols as already ordered, these medications have been reviewed and there are no changes at this time.   Hortencia Pilar, MD  01/11/2017 8:02 AM

## 2017-01-25 ENCOUNTER — Other Ambulatory Visit: Payer: Self-pay | Admitting: Family

## 2017-02-26 ENCOUNTER — Encounter: Payer: Self-pay | Admitting: Family

## 2017-02-26 ENCOUNTER — Ambulatory Visit: Payer: Medicare Other | Attending: Family | Admitting: Family

## 2017-02-26 VITALS — BP 137/68 | HR 86 | Resp 20 | Ht 63.0 in | Wt 151.1 lb

## 2017-02-26 DIAGNOSIS — I130 Hypertensive heart and chronic kidney disease with heart failure and stage 1 through stage 4 chronic kidney disease, or unspecified chronic kidney disease: Secondary | ICD-10-CM | POA: Insufficient documentation

## 2017-02-26 DIAGNOSIS — I5032 Chronic diastolic (congestive) heart failure: Secondary | ICD-10-CM | POA: Insufficient documentation

## 2017-02-26 DIAGNOSIS — Z7901 Long term (current) use of anticoagulants: Secondary | ICD-10-CM | POA: Insufficient documentation

## 2017-02-26 DIAGNOSIS — N189 Chronic kidney disease, unspecified: Secondary | ICD-10-CM | POA: Diagnosis not present

## 2017-02-26 DIAGNOSIS — Z8249 Family history of ischemic heart disease and other diseases of the circulatory system: Secondary | ICD-10-CM | POA: Insufficient documentation

## 2017-02-26 DIAGNOSIS — Z87891 Personal history of nicotine dependence: Secondary | ICD-10-CM | POA: Diagnosis not present

## 2017-02-26 DIAGNOSIS — I4891 Unspecified atrial fibrillation: Secondary | ICD-10-CM | POA: Insufficient documentation

## 2017-02-26 DIAGNOSIS — R635 Abnormal weight gain: Secondary | ICD-10-CM | POA: Diagnosis not present

## 2017-02-26 DIAGNOSIS — I272 Pulmonary hypertension, unspecified: Secondary | ICD-10-CM | POA: Diagnosis not present

## 2017-02-26 DIAGNOSIS — J449 Chronic obstructive pulmonary disease, unspecified: Secondary | ICD-10-CM | POA: Diagnosis not present

## 2017-02-26 DIAGNOSIS — I480 Paroxysmal atrial fibrillation: Secondary | ICD-10-CM

## 2017-02-26 DIAGNOSIS — G4733 Obstructive sleep apnea (adult) (pediatric): Secondary | ICD-10-CM | POA: Diagnosis not present

## 2017-02-26 DIAGNOSIS — E785 Hyperlipidemia, unspecified: Secondary | ICD-10-CM | POA: Diagnosis not present

## 2017-02-26 DIAGNOSIS — I10 Essential (primary) hypertension: Secondary | ICD-10-CM

## 2017-02-26 NOTE — Progress Notes (Signed)
Patient ID: Elaine Decker, female    DOB: [birth year], 71 y.o.   MRN: [id number]  HPI   Elaine Decker is a 71 y/o female with a history of HTN, hyperlipidemia, COPD, obstructive sleep apnea, CKD, asthma, atrial fibrillation, prior tobacco use and chronic heart failure.  Last echo was done 11/06/16 and showed an EF of 60-65% with mild AR, moderate MR and severe TR. Mild to moderate pulmonary HTN was also present. EF has improved greatly from echo done 01/21/16 which showed an EF of 25-30%.  Most recent admission was 12/10/16 for HF exacerbation along with COPD. Given IV diuretics and antibiotics. Cardiology consult was obtained. Previous admission was on 11/05/16 after being seen at outside ED on that same day with acute exacerbation of heart failure. She had not been taking her diuretic as prescribed. Was treated with IV diuretics and lost approximately 12L of fluid. Prior admission was on 07/02/16 for acute on chronic respiratory failure with hypoxemia secondary to COPD and pneumonia. Was treated with IV steroids and antibiotics. Was discharged after 4 days.   She presents today for a follow-up visit with fatigue which is improving. Has occasional shortness of breath but only when she overdoes it. Is walking the dog 1-2 times daily for 5 minutes at a time. Continues to weigh herself and has noticed a gradual weight gain but says that it's due to her overeating. She has travelled to [place] for a business trip and reports that she did well flying.   Past Medical History:  Diagnosis Date  . A-fib ([place])   . Asthma   . CHF (congestive heart failure) ([place])   . CKD (chronic kidney disease)   . COPD (chronic obstructive pulmonary disease) ([place])   . HLD (hyperlipidemia)   . Hypertension    Past Surgical History:  Procedure Laterality Date  . FLEXIBLE BRONCHOSCOPY N/A 02/01/2016   Procedure: FLEXIBLE BRONCHOSCOPY;  Surgeon: Allyne Gee, MD;  Location: [facility] ORS;  Service: Pulmonary;  Laterality: N/A;  .  Surgery on the neck     Family History  Problem Relation Age of Onset  . CAD Mother   . Lung cancer Father   . Arrhythmia Brother    Social History  Substance Use Topics  . Smoking status: Former Smoker    Packs/day: 2.00    Years: 50.00    Quit date: 11/27/2015  . Smokeless tobacco: Never Used  . Alcohol use No   No Known Allergies  Prior to Admission medications   Medication Sig Start Date End Date Taking? Authorizing Provider  acetaminophen (TYLENOL) 500 MG tablet Take 500-1,000 mg by mouth daily.   Yes Historical Provider, MD  albuterol (PROVENTIL HFA;VENTOLIN HFA) 108 (90 Base) MCG/ACT inhaler Inhale 2 puffs into the lungs every 4 (four) hours as needed for wheezing or shortness of breath.   Yes Historical Provider, MD  atorvastatin (LIPITOR) 20 MG tablet Take 20 mg by mouth daily.   Yes Historical Provider, MD  cholecalciferol (VITAMIN D) 1000 UNITS tablet Take 1,000 Units by mouth daily.   Yes Historical Provider, MD  dabigatran (PRADAXA) 150 MG CAPS capsule Take 150 mg by mouth 2 (two) times daily.   Yes Historical Provider, MD  [place] 250 MCG tablet TAKE 1 TABLET(0.25 MG) BY MOUTH DAILY 01/11/17  Yes Alisa Graff, FNP  diltiazem (DILACOR XR) 120 MG 24 hr capsule Take 1 capsule (120 mg total) by mouth daily. 01/09/17  Yes Alisa Graff, FNP  Fluticasone-Salmeterol (ADVAIR) 250-50 MCG/DOSE  AEPB Inhale 1 puff into the lungs 2 (two) times daily.   Yes Historical Provider, MD  ipratropium-albuterol (DUONEB) 0.5-2.5 (3) MG/3ML SOLN Take 3 mLs by nebulization every 6 (six) hours. Patient taking differently: Take 3 mLs by nebulization every 6 (six) hours as needed (for wheezing/shortness of breath).  11/30/15  Yes Fritzi Mandes, MD  lisinopril (PRINIVIL,ZESTRIL) 5 MG tablet TAKE 1 TABLET(5 MG) BY MOUTH DAILY 01/27/17  Yes Alisa Graff, FNP  metoprolol tartrate (LOPRESSOR) 25 MG tablet Take 1 tablet (25 mg total) by mouth as directed. Take 25mg  AM and 50mg  PM 12/28/16  Yes Alisa Graff, FNP  montelukast (SINGULAIR) 10 MG tablet Take 10 mg by mouth at bedtime.    Yes Historical Provider, MD  potassium chloride SA (K-DUR,KLOR-CON) 20 MEQ tablet Take 1 tablet (20 mEq total) by mouth daily. 01/09/17  Yes Alisa Graff, FNP  tiotropium (SPIRIVA) 18 MCG inhalation capsule Place 18 mcg into inhaler and inhale daily.   Yes Historical Provider, MD  torsemide (DEMADEX) 20 MG tablet Take 2 tablets (40 mg total) by mouth daily. 11/28/16 02/26/17 Yes Alisa Graff, FNP     Review of Systems  Constitutional: Positive for fatigue. Negative for appetite change.  HENT: Negative for congestion, postnasal drip and sore throat.   Eyes: Negative.   Respiratory: Positive for cough and shortness of breath. Negative for chest tightness.   Cardiovascular: Negative for chest pain, palpitations and leg swelling.  Gastrointestinal: Negative for abdominal distention and abdominal pain.  Endocrine: Negative.   Genitourinary: Negative.   Musculoskeletal: Negative for back pain and neck pain.  Skin: Negative.   Allergic/Immunologic: Negative.   Neurological: Positive for light-headedness (when lay on the left side). Negative for dizziness.       Restless left leg at bedtime    Hematological: Negative for adenopathy. Does not bruise/bleed easily.  Psychiatric/Behavioral: Positive for sleep disturbance (not sleeping well; sleeping on 1 pillow along with oxygen). Negative for dysphoric mood and suicidal ideas. The patient is not nervous/anxious.    Vitals:   02/26/17 1203  BP: 137/68  Pulse: 86  Resp: 20  SpO2: 99%  Weight: 151 lb 2 oz (68.5 kg)  Height: 5\' 3"  (1.6 m)   Wt Readings from Last 3 Encounters:  02/26/17 151 lb 2 oz (68.5 kg)  01/10/17 144 lb (65.3 kg)  12/28/16 143 lb 2 oz (64.9 kg)   Lab Results  Component Value Date   CREATININE 0.83 12/13/2016   CREATININE 0.77 12/12/2016   CREATININE 0.69 12/11/2016    Physical Exam  Constitutional: She is oriented to person,  place, and time. She appears well-developed and well-nourished.  HENT:  Head: Normocephalic and atraumatic.  Eyes: Conjunctivae are normal. Pupils are equal, round, and reactive to light.  Neck: Normal range of motion. Neck supple. No JVD present.  Cardiovascular: Normal rate and regular rhythm.   Pulmonary/Chest: Effort normal. She has no wheezes. She has no rales.  Abdominal: Soft. She exhibits no distension. There is no tenderness.  Musculoskeletal: She exhibits no edema or tenderness.  Neurological: She is alert and oriented to person, place, and time.  Skin: Skin is warm and dry.  Psychiatric: She has a normal mood and affect. Her behavior is normal. Thought content normal.  Nursing note and vitals reviewed.   Assessment & Plan:  1: Chronic heart failure with preserved ejection fraction- - NYHA class II - on torsemide 40mg  daily - d/c weight 168 pounds and today she is  151.2 pounds. Reminded to call for an overnight weight gain of >2 pounds or a weekly weight gain of >5 pounds. Weight is up 8 pounds from when she was last here January 2018. - not adding salt but admits to eating saltier foods like popcorn. Encouraged to closely follow a 2000mg  sodium diet - drinking around 50-60 ounces of fluid daily - did receive the flu vaccine for this season - saw cardiologist ([place]) 01/23/17 and returns to him 05/23/17  2: HTN- - BP looks good today - saw PCP Candiss Norse) 10/02/16 and returns on 03/21/17  3: Atrial fibrillation- - currently rate controlled at this time - remains on dabigatran, digoxin, diltiazem and metoprolol  4: Obstructive sleep apnea- - wearing oxygen at 2L around the clock - wears CPAP nightly but says that it's not fitting well. Had a sleep study repeated on 01/02/17 and has repeat portion tonight  Patient did not bring her medications nor a list. Each medication was verbally reviewed with the patient and she was encouraged to bring the bottles to every visit to confirm  accuracy of list.  Return in 6 months or sooner for any questions/problems before then.

## 2017-02-26 NOTE — Patient Instructions (Signed)
Continue weighing daily and call for an overnight weight gain of > 2 pounds or a weekly weight gain of >5 pounds. 

## 2017-04-25 ENCOUNTER — Other Ambulatory Visit: Payer: Self-pay | Admitting: Family

## 2017-07-10 ENCOUNTER — Other Ambulatory Visit: Payer: Self-pay | Admitting: Family

## 2017-08-05 ENCOUNTER — Other Ambulatory Visit: Payer: Self-pay | Admitting: Family

## 2017-08-27 IMAGING — CT CT CHEST W/ CM
2 of 3 series · 15 of 36 positions shown, 18 images · IV contrast (omnipaque)
Comparison: 01/21/2016

CLINICAL DATA: Pneumonia 3 months ago. Shortness of breath for 2
weeks.

EXAM:
CT CHEST WITH CONTRAST
TECHNIQUE: Multidetector CT imaging of the chest was performed during
intravenous contrast administration.
CONTRAST:  75mL OMNIPAQUE IOHEXOL 300 MG/ML  SOLN

[Series 2: soft tissue · axial · 0.64mm/px · z∈[-515,-245]mm · 12 of 64 slices shown, 15 images]
[im 5/64  mediastinal]
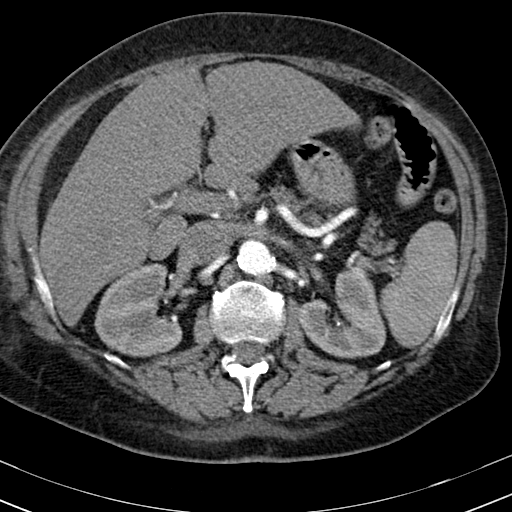
[im 5/64  lung]
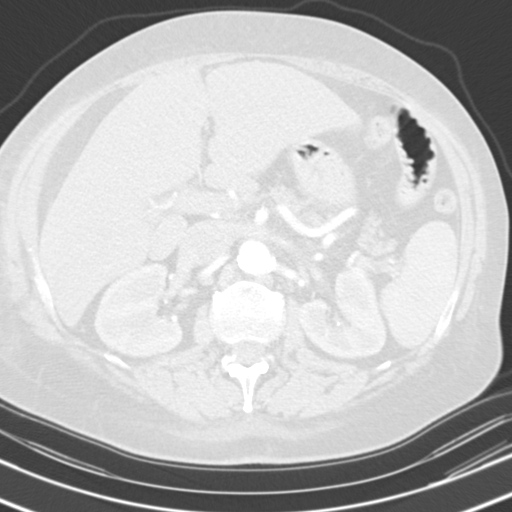
[im 10/64  lung]
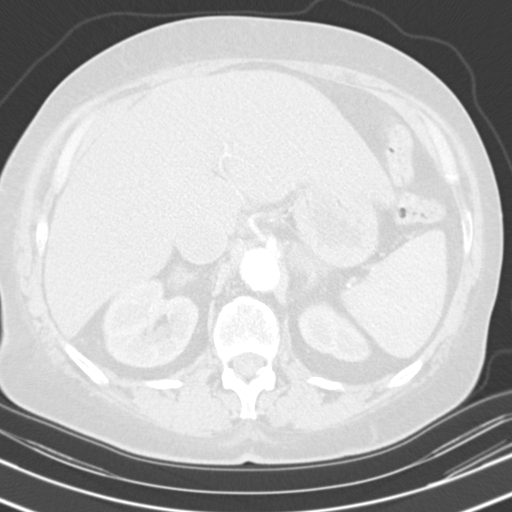
[im 15/64  lung]
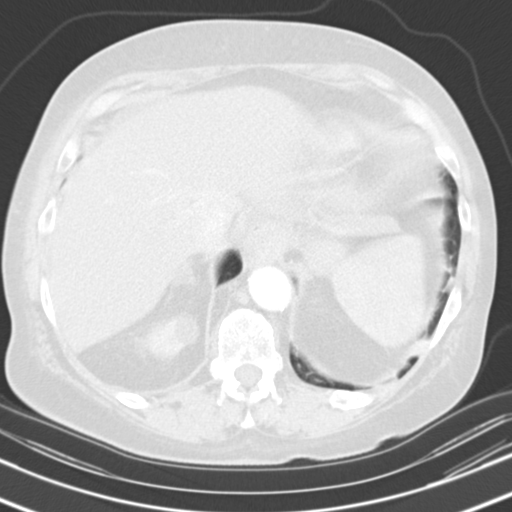
[im 19/64  lung]
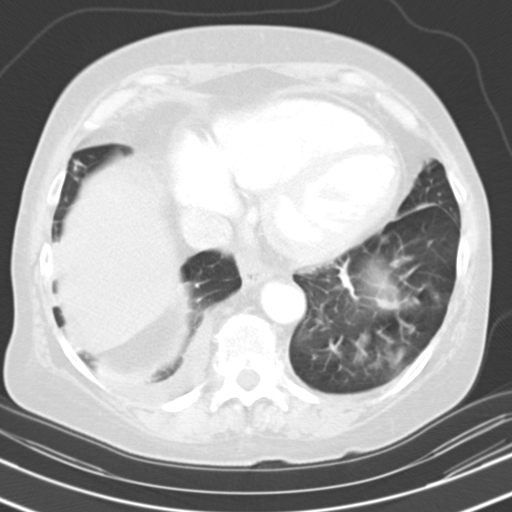
[im 24/64  mediastinal]
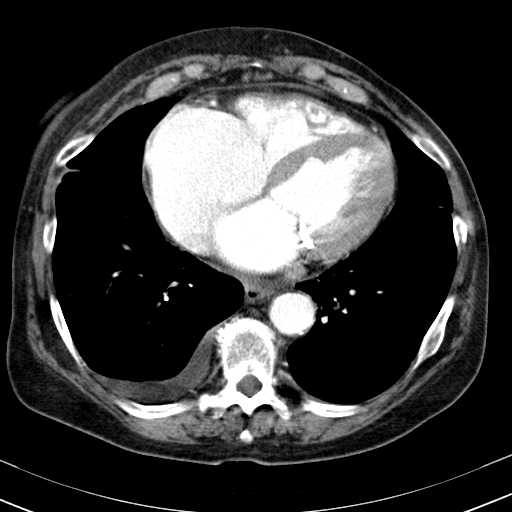
[im 24/64  lung]
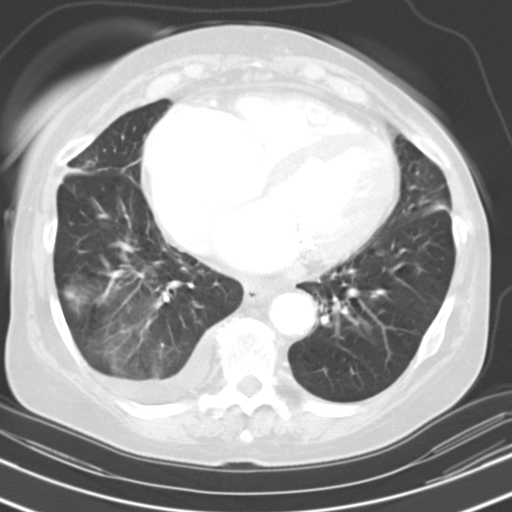
[im 29/64  lung]
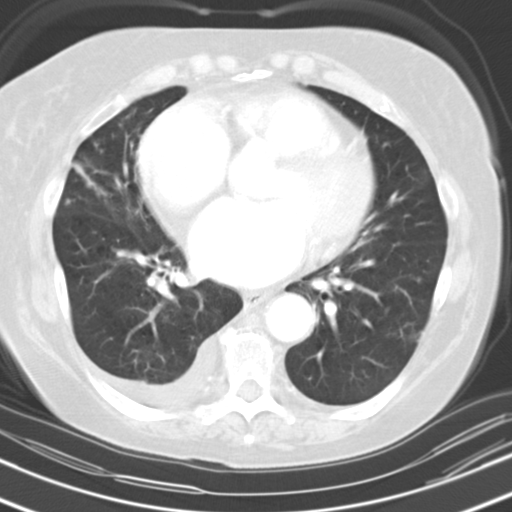
[im 36/64  lung]
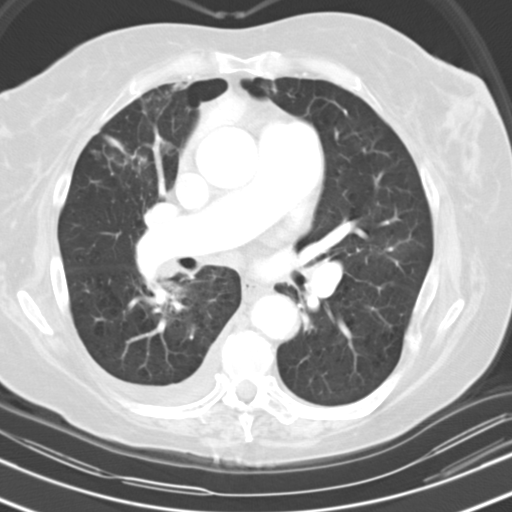
[im 40/64  lung]
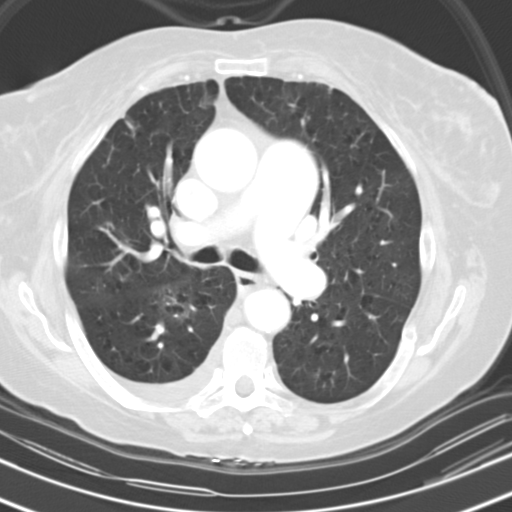
[im 45/64  mediastinal]
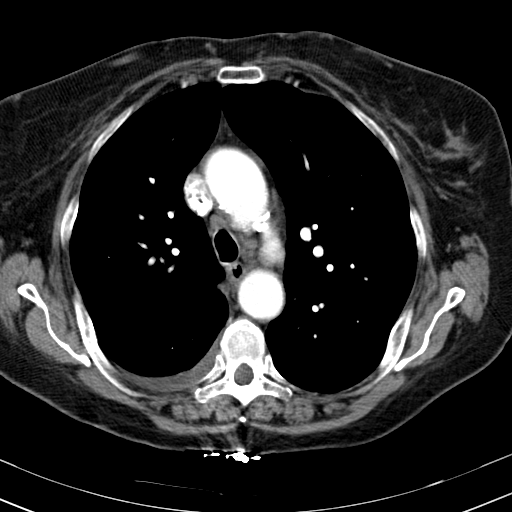
[im 45/64  lung]
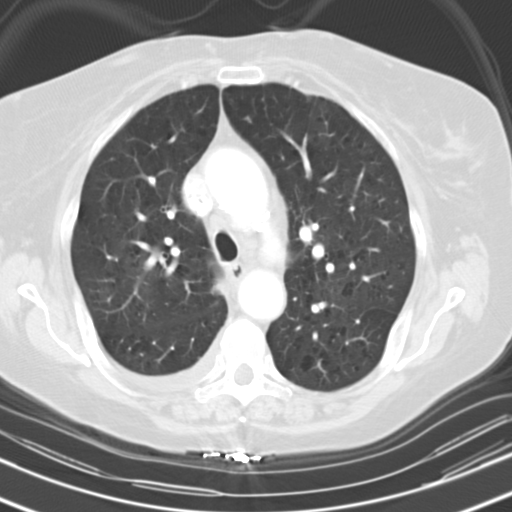
[im 50/64  lung]
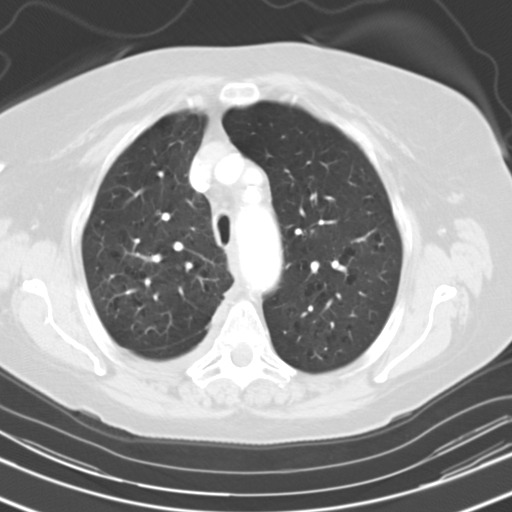
[im 54/64  lung]
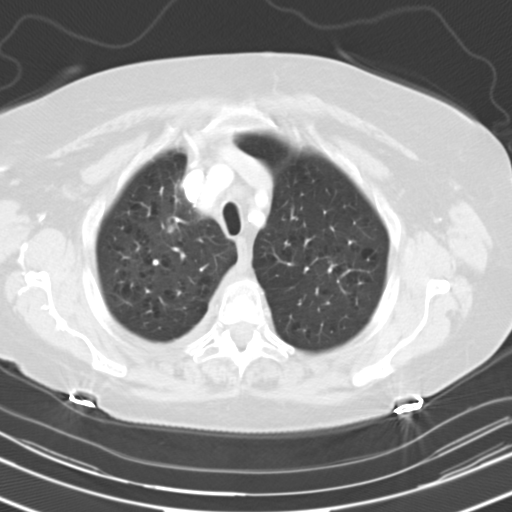
[im 59/64  lung]
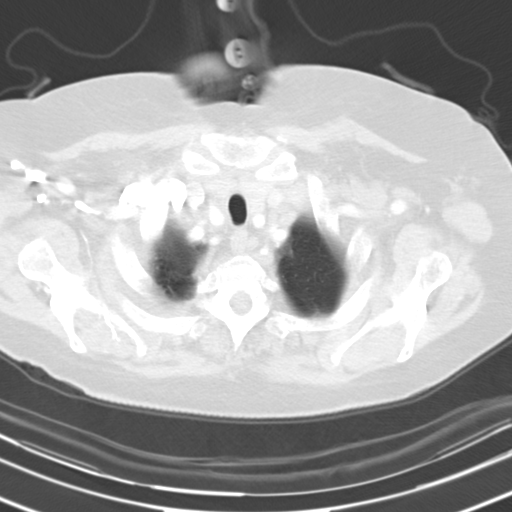

[Series 602: coronal · coronal · 0.64mm/px · 3 of 114 slices shown]
[im 23/114  lung]
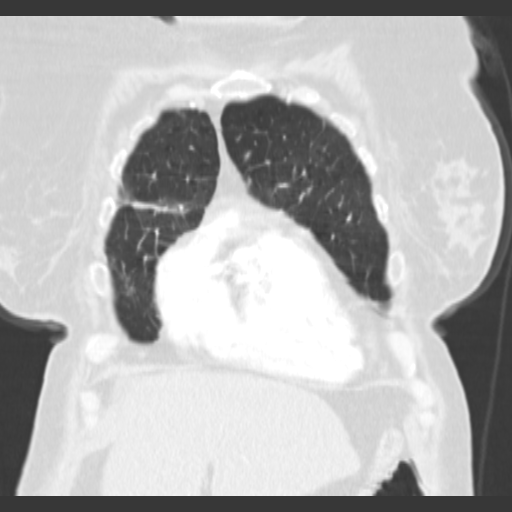
[im 46/114  lung]
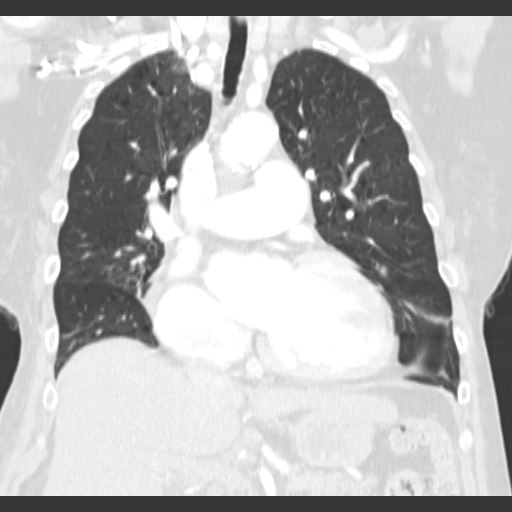
[im 68/114  lung]
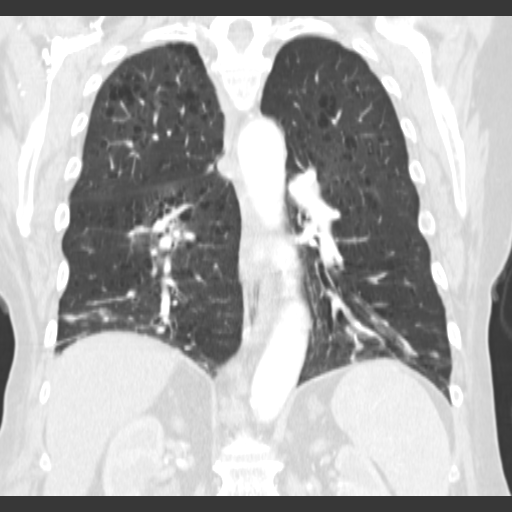

[15 of 36 positions shown; findings below may reference images not displayed]

FINDINGS: Mediastinum/Nodes: Mild cardiomegaly. Small scattered mediastinal
lymph nodes, none pathologically enlarged or changed. No axillary or
hilar adenopathy. No evidence of aortic aneurysm.

Lungs/Pleura: Mild emphysema. Pleural effusions have decreased
bilaterally. Small residual right pleural effusion. Left effusion
has resolved. Improved aeration in the lungs. Scarring in the
anterior right upper lobe and right middle lobe, stable. Scarring or
atelectasis in the lung bases. No confluent airspace opacities.

Upper abdomen: Imaging into the upper abdomen shows no acute
findings. Stable bilateral adrenal nodules.

Musculoskeletal: Chest wall soft tissues are unremarkable. No acute
bony abnormality or focal bone lesion. Old left rib fractures again
noted.
IMPRESSION: Mild emphysema.

Resolution of the previously seen left pleural effusion with
significant decrease in the right pleural effusion, now small. Areas
of scarring in the anterior right upper lobe and right middle lobe.
Scarring or atelectasis in the lung bases with improved aeration.

Mild cardiomegaly.

## 2017-08-29 ENCOUNTER — Ambulatory Visit: Payer: Medicare Other | Attending: Family | Admitting: Family

## 2017-08-29 ENCOUNTER — Encounter: Payer: Self-pay | Admitting: Family

## 2017-08-29 VITALS — BP 134/98 | HR 93 | Resp 18 | Ht 63.0 in | Wt 166.5 lb

## 2017-08-29 DIAGNOSIS — I4891 Unspecified atrial fibrillation: Secondary | ICD-10-CM | POA: Insufficient documentation

## 2017-08-29 DIAGNOSIS — Z79899 Other long term (current) drug therapy: Secondary | ICD-10-CM | POA: Diagnosis not present

## 2017-08-29 DIAGNOSIS — G4733 Obstructive sleep apnea (adult) (pediatric): Secondary | ICD-10-CM | POA: Diagnosis not present

## 2017-08-29 DIAGNOSIS — E785 Hyperlipidemia, unspecified: Secondary | ICD-10-CM | POA: Diagnosis not present

## 2017-08-29 DIAGNOSIS — I10 Essential (primary) hypertension: Secondary | ICD-10-CM

## 2017-08-29 DIAGNOSIS — I130 Hypertensive heart and chronic kidney disease with heart failure and stage 1 through stage 4 chronic kidney disease, or unspecified chronic kidney disease: Secondary | ICD-10-CM | POA: Diagnosis not present

## 2017-08-29 DIAGNOSIS — N189 Chronic kidney disease, unspecified: Secondary | ICD-10-CM | POA: Insufficient documentation

## 2017-08-29 DIAGNOSIS — J449 Chronic obstructive pulmonary disease, unspecified: Secondary | ICD-10-CM | POA: Diagnosis not present

## 2017-08-29 DIAGNOSIS — I480 Paroxysmal atrial fibrillation: Secondary | ICD-10-CM

## 2017-08-29 DIAGNOSIS — I5032 Chronic diastolic (congestive) heart failure: Secondary | ICD-10-CM | POA: Diagnosis present

## 2017-08-29 DIAGNOSIS — Z87891 Personal history of nicotine dependence: Secondary | ICD-10-CM | POA: Insufficient documentation

## 2017-08-29 NOTE — Patient Instructions (Addendum)
Continue weighing daily and call for an overnight weight gain of > 2 pounds or a weekly weight gain of >5 pounds.  Increase morning dose of lopressor to 50mg  and continue 50mg  at night

## 2017-08-29 NOTE — Progress Notes (Addendum)
Agree with pharmacist note below.  Physical exam and ROS were done by myself.  Digoxin had been stopped by cardiology and HR is higher than it has been. She says that it's been running higher at home as well. Will increase her AM dose of metoprolol tartrate to 50mg  and continue the PM dose as 50mg . When she runs out of the 25mg  tablets, she can call back so a new prescription of 50mg  tablets can be called in.   Encouraged her to work on her diet/exercise as she's gained 15 pounds in the last 6 months. Hasn't had any overnight weight gain of >2 pounds or weekly weight gain of >5 pounds.  Return here in 1 month or sooner for any questions/problems before then.  [name], FNP [facility]    Patient ID: Elaine Decker, female    DOB: 1946-06-06, 71 y.o.   MRN: [id number]  HPI   Elaine Decker is a 71 y/o female with a history of HTN, hyperlipidemia, COPD, obstructive sleep apnea, CKD, asthma, atrial fibrillation, prior tobacco use and chronic heart failure.  Last echo was done 11/06/16 and showed an EF of 60-65% with mild AR, moderate MR and severe TR. Mild to moderate pulmonary HTN was also present. EF has improved greatly from echo done 01/21/16 which showed an EF of 25-30%.  Most recent admission was 12/10/16 for HF exacerbation along with COPD. Given IV diuretics and antibiotics. Cardiology consult was obtained. Previous admission was on 11/05/16 after being seen at outside ED on that same day with acute exacerbation of heart failure. She had not been taking her diuretic as prescribed. Was treated with IV diuretics and lost approximately 12L of fluid. Prior admission was on 07/02/16 for acute on chronic respiratory failure with hypoxemia secondary to COPD and pneumonia. Was treated with IV steroids and antibiotics. Was discharged after 4 days.   She presents today for a follow-up visit with SOB, fatigue and cough. Has recently been more SOB which she attributes to beginning after she spent a  day on a boat with a dog (has allergy to dogs). Is walking her dog (hypoallergenic) every day for exercise. Continues to weigh herself and has noticed a gradual weight gain but says that it's due to her overeating. States she has been on vacation recently and hopes to get back to her regular eating habits soon. Is no longer on digoxin and says about a week after stopping digoxin she had a syncope episode that she did not remember. She experienced a second episode of syncope. Has not felt dizzy or lightheaded in the last week or two.   Past Medical History:  Diagnosis Date  . A-fib ([place])   . Asthma   . CHF (congestive heart failure) ([place])   . CKD (chronic kidney disease)   . COPD (chronic obstructive pulmonary disease) ([place])   . HLD (hyperlipidemia)   . Hypertension    Past Surgical History:  Procedure Laterality Date  . FLEXIBLE BRONCHOSCOPY N/A 02/01/2016   Procedure: FLEXIBLE BRONCHOSCOPY;  Surgeon: Allyne Gee, MD;  Location: [facility] ORS;  Service: Pulmonary;  Laterality: N/A;  . Surgery on the neck     Family History  Problem Relation Age of Onset  . CAD Mother   . Lung cancer Father   . Arrhythmia Brother    Social History  Substance Use Topics  . Smoking status: Former Smoker    Packs/day: 2.00    Years: 50.00    Quit date: 11/27/2015  .  Smokeless tobacco: Never Used  . Alcohol use No   No Known Allergies  Prior to Admission medications   Medication Sig Start Date End Date Taking? Authorizing Provider  acetaminophen (TYLENOL) 500 MG tablet Take 500-1,000 mg by mouth daily.   Yes Historical Provider, MD  albuterol (PROVENTIL HFA;VENTOLIN HFA) 108 (90 Base) MCG/ACT inhaler Inhale 2 puffs into the lungs every 4 (four) hours as needed for wheezing or shortness of breath.   Yes Historical Provider, MD  atorvastatin (LIPITOR) 20 MG tablet Take 20 mg by mouth daily.   Yes Historical Provider, MD  cholecalciferol (VITAMIN D) 1000 UNITS tablet Take 1,000 Units by mouth daily.   Yes  Historical Provider, MD  dabigatran (PRADAXA) 150 MG CAPS capsule Take 150 mg by mouth 2 (two) times daily.   Yes Historical Provider, MD  [place] 250 MCG tablet TAKE 1 TABLET(0.25 MG) BY MOUTH DAILY 01/11/17  Yes Alisa Graff, FNP  diltiazem (DILACOR XR) 120 MG 24 hr capsule Take 1 capsule (120 mg total) by mouth daily. 01/09/17  Yes Alisa Graff, FNP  Fluticasone-Salmeterol (ADVAIR) 250-50 MCG/DOSE AEPB Inhale 1 puff into the lungs 2 (two) times daily.   Yes Historical Provider, MD  ipratropium-albuterol (DUONEB) 0.5-2.5 (3) MG/3ML SOLN Take 3 mLs by nebulization every 6 (six) hours. Patient taking differently: Take 3 mLs by nebulization every 6 (six) hours as needed (for wheezing/shortness of breath).  11/30/15  Yes Fritzi Mandes, MD  lisinopril (PRINIVIL,ZESTRIL) 5 MG tablet TAKE 1 TABLET(5 MG) BY MOUTH DAILY 01/27/17  Yes Alisa Graff, FNP  metoprolol tartrate (LOPRESSOR) 25 MG tablet Take 1 tablet (25 mg total) by mouth as directed. Take 25mg  AM and 50mg  PM 12/28/16  Yes Alisa Graff, FNP  montelukast (SINGULAIR) 10 MG tablet Take 10 mg by mouth at bedtime.    Yes Historical Provider, MD  potassium chloride SA (K-DUR,KLOR-CON) 20 MEQ tablet Take 1 tablet (20 mEq total) by mouth daily. 01/09/17  Yes Alisa Graff, FNP  tiotropium (SPIRIVA) 18 MCG inhalation capsule Place 18 mcg into inhaler and inhale daily.   Yes Historical Provider, MD  torsemide (DEMADEX) 20 MG tablet Take 2 tablets (40 mg total) by mouth daily. 11/28/16 02/26/17 Yes Alisa Graff, FNP    Review of Systems  Constitutional: Positive for fatigue. Negative for appetite change.  HENT: Negative for congestion, postnasal drip and sore throat.   Eyes: Negative.   Respiratory: Positive for cough and shortness of breath. Negative for chest tightness and wheezing.   Cardiovascular: Negative for chest pain, palpitations and leg swelling.  Gastrointestinal: Negative for abdominal distention and abdominal pain.  Endocrine: Negative.    Genitourinary: Negative.   Musculoskeletal: Negative for back pain and neck pain.  Skin: Negative.   Allergic/Immunologic: Negative.   Neurological: Positive for light-headedness (when lay on the left side). Negative for dizziness.       Restless left leg at bedtime    Hematological: Negative for adenopathy. Does not bruise/bleed easily.  Psychiatric/Behavioral: Positive for sleep disturbance (not sleeping well; sleeping on 1 pillow along with oxygen). Negative for dysphoric mood and suicidal ideas. The patient is not nervous/anxious.    Vitals:   08/29/17 0910  BP: (!) 134/98  Pulse: 93  Resp: 18  SpO2: 98%  Weight: 166 lb 8 oz (75.5 kg)  Height: 5\' 3"  (1.6 m)   Wt Readings from Last 3 Encounters:  08/29/17 166 lb 8 oz (75.5 kg)  02/26/17 151 lb 2 oz (68.5 kg)  01/10/17 144 lb (65.3 kg)   Lab Results  Component Value Date   CREATININE 0.83 12/13/2016   CREATININE 0.77 12/12/2016   CREATININE 0.69 12/11/2016    Physical Exam  Constitutional: She is oriented to person, place, and time. She appears well-developed and well-nourished.  HENT:  Head: Normocephalic and atraumatic.  Eyes: Pupils are equal, round, and reactive to light. Conjunctivae are normal.  Neck: Normal range of motion. Neck supple. No JVD present.  Cardiovascular: Normal rate and regular rhythm.   Pulmonary/Chest: Effort normal. She has no wheezes. She has no rales.  Abdominal: Soft. She exhibits no distension. There is no tenderness.  Musculoskeletal: She exhibits no edema or tenderness.  Neurological: She is alert and oriented to person, place, and time.  Skin: Skin is warm and dry.  Psychiatric: She has a normal mood and affect. Her behavior is normal. Thought content normal.  Nursing note and vitals reviewed.   Assessment & Plan:  1: Chronic heart failure with preserved ejection fraction- - NYHA class II - Presents with SOB and cough; wears O2 around the clock and CPAP at night  - on torsemide  40mg  daily - Has gained 15 pounds in 6 months due to not eating as healthy; Reminded to call for an overnight weight gain of >2 pounds or a weekly weight gain of >5 pounds. - not adding salt to foods; Encouraged to closely follow a 2000mg  sodium diet - drinking around 50-60 ounces of fluid daily - saw cardiologist ([place]) 06/12/17 - Has follow up appointment with pulmonologist Humphrey Rolls) next week   2: HTN- - BP 130/98 mmHg today  - Encouraged patient to check daily blood pressure and record to bring to next appointment - Continue current BP medications   3: Atrial fibrillation- - remains on dabigatran, diltiazem and metoprolol - Digoxin was discontinued in June 2018 - HR on the high end (93bpm); Increased Metoprolol tartrate to 50mg  twice daily   4: Obstructive sleep apnea- - wearing oxygen at 2L around the clock - wears CPAP nightly but says that it's not fitting well. Had a sleep study repeated on 01/02/17 and has repeat portion tonight  Patient did not bring her medications nor a list. Each medication was verbally reviewed with the patient and she was encouraged to bring the bottles to every visit to confirm accuracy of list.  Return in 1 month or sooner for any questions/problems before then.   Candelaria Stagers, PharmD  Pharmacy Resident  08/29/17

## 2017-09-09 ENCOUNTER — Other Ambulatory Visit: Payer: Self-pay | Admitting: Family

## 2017-09-09 MED ORDER — METOPROLOL TARTRATE 50 MG PO TABS: 50.0000 mg | ORAL_TABLET | Freq: Two times a day (BID) | ORAL | 3 refills | Status: AC

## 2017-09-26 ENCOUNTER — Ambulatory Visit: Payer: Medicare Other | Attending: Family | Admitting: Family

## 2017-09-26 VITALS — BP 130/82 | HR 82 | Resp 18 | Ht 63.0 in | Wt 166.4 lb

## 2017-09-26 DIAGNOSIS — J449 Chronic obstructive pulmonary disease, unspecified: Secondary | ICD-10-CM | POA: Insufficient documentation

## 2017-09-26 DIAGNOSIS — I5032 Chronic diastolic (congestive) heart failure: Secondary | ICD-10-CM | POA: Diagnosis present

## 2017-09-26 DIAGNOSIS — E785 Hyperlipidemia, unspecified: Secondary | ICD-10-CM | POA: Diagnosis not present

## 2017-09-26 DIAGNOSIS — Z7901 Long term (current) use of anticoagulants: Secondary | ICD-10-CM | POA: Insufficient documentation

## 2017-09-26 DIAGNOSIS — I480 Paroxysmal atrial fibrillation: Secondary | ICD-10-CM

## 2017-09-26 DIAGNOSIS — I4891 Unspecified atrial fibrillation: Secondary | ICD-10-CM | POA: Diagnosis present

## 2017-09-26 DIAGNOSIS — Z87891 Personal history of nicotine dependence: Secondary | ICD-10-CM | POA: Insufficient documentation

## 2017-09-26 DIAGNOSIS — I130 Hypertensive heart and chronic kidney disease with heart failure and stage 1 through stage 4 chronic kidney disease, or unspecified chronic kidney disease: Secondary | ICD-10-CM | POA: Insufficient documentation

## 2017-09-26 DIAGNOSIS — G4733 Obstructive sleep apnea (adult) (pediatric): Secondary | ICD-10-CM | POA: Diagnosis not present

## 2017-09-26 DIAGNOSIS — N189 Chronic kidney disease, unspecified: Secondary | ICD-10-CM | POA: Diagnosis not present

## 2017-09-26 DIAGNOSIS — Z79899 Other long term (current) drug therapy: Secondary | ICD-10-CM | POA: Insufficient documentation

## 2017-09-26 DIAGNOSIS — I10 Essential (primary) hypertension: Secondary | ICD-10-CM

## 2017-09-26 DIAGNOSIS — I272 Pulmonary hypertension, unspecified: Secondary | ICD-10-CM | POA: Diagnosis not present

## 2017-09-26 NOTE — Patient Instructions (Addendum)
Continue weighing daily and call for an overnight weight gain of > 2 pounds or a weekly weight gain of >5 pounds.  Dr. Raul Del [phone] [facility] Pulmonology [phone]

## 2017-09-26 NOTE — Progress Notes (Signed)
Patient ID: Elaine Decker, female    DOB: 06-18-1946, 71 y.o.   MRN: [id number]  HPI   Ms Elaine Decker is a 71 y/o female with a history of HTN, hyperlipidemia, COPD, obstructive sleep apnea, CKD, asthma, atrial fibrillation, prior tobacco use and chronic heart failure.  Last echo was done 11/06/16 and showed an EF of 60-65% with mild AR, moderate MR and severe TR. Mild to moderate pulmonary HTN was also present. EF has improved greatly from echo done 01/21/16 which showed an EF of 25-30%.  Most recent admission was 12/10/16 for HF exacerbation along with COPD. Given IV diuretics and antibiotics. Cardiology consult was obtained. Previous admission was on 11/05/16 after being seen at outside ED on that same day with acute exacerbation of heart failure. She had not been taking her diuretic as prescribed. Was treated with IV diuretics and lost approximately 12L of fluid. Prior admission was on 07/02/16 for acute on chronic respiratory failure with hypoxemia secondary to COPD and pneumonia. Was treated with IV steroids and antibiotics. Was discharged after 4 days.   She presents today for a follow-up visit with mild shortness of breath upon moderate exertion. She describes this as being present for several years with varying levels of severity. She does feel like her shortness of breath is worsening but feels like it's more related to her COPD. She has associated fatigue, cough, tremors and difficulty sleeping. Occasionally notices palpitations at night when she's transitioning her oxygen from her nasal cannula to her CPAP. Denies any chest pain, edema, dizziness or weight gain.   Past Medical History:  Diagnosis Date  . A-fib ([place])   . Asthma   . CHF (congestive heart failure) ([place])   . CKD (chronic kidney disease)   . COPD (chronic obstructive pulmonary disease) ([place])   . HLD (hyperlipidemia)   . Hypertension    Past Surgical History:  Procedure Laterality Date  . FLEXIBLE BRONCHOSCOPY N/A 02/01/2016   Procedure: FLEXIBLE BRONCHOSCOPY;  Surgeon: Allyne Gee, MD;  Location: [facility] ORS;  Service: Pulmonary;  Laterality: N/A;  . Surgery on the neck     Family History  Problem Relation Age of Onset  . CAD Mother   . Lung cancer Father   . Arrhythmia Brother    Social History  Substance Use Topics  . Smoking status: Former Smoker    Packs/day: 2.00    Years: 50.00    Quit date: 11/27/2015  . Smokeless tobacco: Never Used  . Alcohol use No   No Known Allergies  Prior to Admission medications   Medication Sig Start Date End Date Taking? Authorizing Provider  acetaminophen (TYLENOL) 500 MG tablet Take 500-1,000 mg by mouth daily.   Yes [provider]  albuterol (PROVENTIL HFA;VENTOLIN HFA) 108 (90 Base) MCG/ACT inhaler Inhale 2 puffs into the lungs every 4 (four) hours as needed for wheezing or shortness of breath.   Yes [provider]  atorvastatin (LIPITOR) 20 MG tablet Take 20 mg by mouth daily.   Yes [provider]  cholecalciferol (VITAMIN D) 1000 UNITS tablet Take 1,000 Units by mouth daily.   Yes [provider]  dabigatran (PRADAXA) 150 MG CAPS capsule Take 150 mg by mouth 2 (two) times daily.   Yes [provider]  diltiazem (DILACOR XR) 120 MG 24 hr capsule Take 1 capsule (120 mg total) by mouth daily. 01/09/17  Yes Ciarah Peace A, FNP  Fluticasone-Salmeterol (ADVAIR) 250-50 MCG/DOSE AEPB Inhale 1 puff into the lungs 2 (two)  times daily.   Yes [provider]  ipratropium-albuterol (DUONEB) 0.5-2.5 (3) MG/3ML SOLN Take 3 mLs by nebulization every 6 (six) hours. Patient taking differently: Take 3 mLs by nebulization every 6 (six) hours as needed (for wheezing/shortness of breath).  11/30/15  Yes Fritzi Mandes, MD  lisinopril (PRINIVIL,ZESTRIL) 5 MG tablet TAKE 1 TABLET(5 MG) BY MOUTH DAILY 08/05/17  Yes [name] A, FNP  metoprolol tartrate (LOPRESSOR) 50 MG tablet Take 1 tablet (50 mg total) by mouth 2 (two) times daily.  09/09/17  Yes Nehemiah Montee A, FNP  montelukast (SINGULAIR) 10 MG tablet Take 10 mg by mouth at bedtime.    Yes [provider]  Multiple Vitamin (MULTIVITAMIN) capsule Take 1 capsule by mouth daily.   Yes [provider]  potassium chloride SA (K-DUR,KLOR-CON) 20 MEQ tablet Take 1 tablet (20 mEq total) by mouth daily. 01/09/17  Yes [name] A, FNP  tiotropium (SPIRIVA) 18 MCG inhalation capsule Place 18 mcg into inhaler and inhale daily.   Yes [provider]  torsemide (DEMADEX) 20 MG tablet TAKE 2 TABLETS(40 MG) BY MOUTH DAILY 04/25/17  Yes Alisa Graff, FNP    Review of Systems  Constitutional: Positive for fatigue. Negative for appetite change.  HENT: Negative for congestion, postnasal drip and sore throat.   Eyes: Negative.   Respiratory: Positive for cough and shortness of breath. Negative for chest tightness and wheezing.   Cardiovascular: Negative for chest pain, palpitations and leg swelling.  Gastrointestinal: Negative for abdominal distention and abdominal pain.  Endocrine: Negative.   Genitourinary: Negative.   Musculoskeletal: Negative for back pain and neck pain.  Skin: Negative.   Allergic/Immunologic: Negative.   Neurological: Positive for tremors. Negative for dizziness and light-headedness.       Restless left leg at bedtime    Hematological: Negative for adenopathy. Does not bruise/bleed easily.  Psychiatric/Behavioral: Positive for sleep disturbance (not sleeping well; sleeping on 1 pillow along with oxygen). Negative for dysphoric mood and suicidal ideas. The patient is not nervous/anxious.    Vitals:   09/26/17 0905  BP: 130/82  Pulse: 82  Resp: 18  SpO2: 92%  Weight: 166 lb 6 oz (75.5 kg)  Height: 5\' 3"  (1.6 m)   Wt Readings from Last 3 Encounters:  09/26/17 166 lb 6 oz (75.5 kg)  08/29/17 166 lb 8 oz (75.5 kg)  02/26/17 151 lb 2 oz (68.5 kg)    Lab Results  Component Value Date   CREATININE 0.83 12/13/2016    CREATININE 0.77 12/12/2016   CREATININE 0.69 12/11/2016    Physical Exam  Constitutional: She is oriented to person, place, and time. She appears well-developed and well-nourished.  HENT:  Head: Normocephalic and atraumatic.  Eyes: Pupils are equal, round, and reactive to light. Conjunctivae are normal.  Neck: Normal range of motion. Neck supple. No JVD present.  Cardiovascular: Normal rate and regular rhythm.   Pulmonary/Chest: Effort normal. She has no wheezes. She has rhonchi in the right upper field. She has no rales.  Abdominal: Soft. She exhibits no distension. There is no tenderness.  Musculoskeletal: She exhibits no edema or tenderness.  Neurological: She is alert and oriented to person, place, and time.  Skin: Skin is warm and dry.  Psychiatric: She has a normal mood and affect. Her behavior is normal. Thought content normal.  Nursing note and vitals reviewed.   Assessment & Plan:  1: Chronic heart failure with preserved ejection fraction- - NYHA class II - on torsemide 40mg   daily - Continues to weigh daily and says that her home weight has been stable.  Reminded to call for an overnight weight gain of >2 pounds or a weekly weight gain of >5 pounds. - not adding salt to foods; Encouraged to closely follow a 2000mg  sodium diet - did go out of town and went to a bacon/bourbon festival. Admits to eating bacon and other foods that were high in salt. Is now back on her normal diet. - drinking around 50-60 ounces of fluid daily - saw cardiologist ([place]) 09/12/17 and returns to him January 2019 - Has follow up appointment with pulmonologist Humphrey Rolls) next week  - says that she may want a 2nd opinion regarding her COPD and requests phone numbers of other practices. This was provided on her AVS  2: HTN- - BP 130/82 mmHg today  - Continue medications at this time  3: Atrial fibrillation- - remains on dabigatran, diltiazem and metoprolol - Digoxin was discontinued in June 2018 -  Metoprolol tartrate now 50mg  twice daily and she is tolerating that well  4: Obstructive sleep apnea- - wearing oxygen at 2L around the clock - wears CPAP nightly   Patient did not bring her medications nor a list. Each medication was verbally reviewed with the patient and she was encouraged to bring the bottles to every visit to confirm accuracy of list.  Return in 6 months or sooner for any questions/problems before then.

## 2017-09-27 ENCOUNTER — Encounter: Payer: Self-pay | Admitting: Family

## 2017-11-27 ENCOUNTER — Ambulatory Visit (INDEPENDENT_AMBULATORY_CARE_PROVIDER_SITE_OTHER): Payer: Medicare Other | Admitting: Internal Medicine

## 2017-11-27 ENCOUNTER — Encounter: Payer: Self-pay | Admitting: Internal Medicine

## 2017-11-27 VITALS — BP 117/68 | HR 77 | Resp 18 | Ht 63.0 in | Wt 175.0 lb

## 2017-11-27 DIAGNOSIS — I480 Paroxysmal atrial fibrillation: Secondary | ICD-10-CM | POA: Diagnosis not present

## 2017-11-27 DIAGNOSIS — J440 Chronic obstructive pulmonary disease with acute lower respiratory infection: Secondary | ICD-10-CM

## 2017-11-27 DIAGNOSIS — J9611 Chronic respiratory failure with hypoxia: Secondary | ICD-10-CM

## 2017-11-27 DIAGNOSIS — J209 Acute bronchitis, unspecified: Secondary | ICD-10-CM | POA: Diagnosis not present

## 2017-11-27 DIAGNOSIS — G4733 Obstructive sleep apnea (adult) (pediatric): Secondary | ICD-10-CM

## 2017-11-27 MED ORDER — PREDNISONE 10 MG (21) PO TBPK
ORAL_TABLET | ORAL | 0 refills | Status: DC
Start: 2017-11-27 — End: 2018-03-27

## 2017-11-27 MED ORDER — AZITHROMYCIN 250 MG PO TABS: ORAL_TABLET | ORAL | 0 refills | Status: DC

## 2017-11-27 NOTE — Progress Notes (Signed)
Pulmonary Critical Care    Elaine Decker EVO:[id number] DOB: 06/19/1946 DOA: (Not on file)   Chief Complaint: Shortness of Breath  HPI: Elaine Decker is a 71 y.o. female She has been noting more than usual shortness of breath. She is currently on 2lpm. She states that she did increased her oxygen temporarily with some improvement. She has been having cough and some sputum. She has noted mainly exertional dyspnea. She has had no fever noted. She has no chest pain noted. No edema is noted. She is not on steroids at this time. Patient was last admitted last year  Review of Systems:  Constitutional:  No weight loss, night sweats, Fevers, chills, +fatigue.  HEENT:  No headaches, nasal congestion, post nasal drip,  Cardio-vascular:  No chest pain, Orthopnea, PND, no swelling in lower extremities, anasarca, dizziness, palpitations  GI:  No heartburn, indigestion, abdominal pain, nausea, vomiting, diarrhea  Resp:  +shortness of breath with exertion. +cough, No coughing up of blood.No wheezing Skin:  no rash or lesions.  Musculoskeletal:  No joint pain or swelling.   Remainder ROS performed and is unremarkable other than noted in HPI  Past Medical History:  Diagnosis Date  . A-fib ([place])   . Asthma   . CHF (congestive heart failure) ([place])   . CKD (chronic kidney disease)   . COPD (chronic obstructive pulmonary disease) ([place])   . HLD (hyperlipidemia)   . Hypertension    Past Surgical History:  Procedure Laterality Date  . FLEXIBLE BRONCHOSCOPY N/A 02/01/2016   Procedure: FLEXIBLE BRONCHOSCOPY;  Surgeon: Allyne Gee, MD;  Location: [facility] ORS;  Service: Pulmonary;  Laterality: N/A;  . Surgery on the neck     Social History:  reports that she quit smoking about 2 years ago. She has a 100.00 pack-year smoking history. she has never used smokeless tobacco. She reports that she does not drink alcohol or use drugs.  No Known Allergies  Family History  Problem Relation Age of Onset  .  CAD Mother   . Lung cancer Father   . Arrhythmia Brother     Prior to Admission medications   Medication Sig Start Date End Date Taking? Authorizing Provider  Tiotropium Bromide Monohydrate (SPIRIVA RESPIMAT) 1.25 MCG/ACT AERS Inhale 1.25 mcg into the lungs daily. 1 puff daily   Yes [provider]  acetaminophen (TYLENOL) 500 MG tablet Take 500-1,000 mg by mouth daily.    [provider]  albuterol (PROVENTIL HFA;VENTOLIN HFA) 108 (90 Base) MCG/ACT inhaler Inhale 2 puffs into the lungs every 4 (four) hours as needed for wheezing or shortness of breath.    [provider]  atorvastatin (LIPITOR) 20 MG tablet Take 20 mg by mouth daily.    [provider]  cholecalciferol (VITAMIN D) 1000 UNITS tablet Take 1,000 Units by mouth daily.    [provider]  dabigatran (PRADAXA) 150 MG CAPS capsule Take 150 mg by mouth 2 (two) times daily.    [provider]  diltiazem (DILACOR XR) 120 MG 24 hr capsule Take 1 capsule (120 mg total) by mouth daily. 01/09/17   Alisa Graff, FNP  Fluticasone-Salmeterol (ADVAIR) 250-50 MCG/DOSE AEPB Inhale 1 puff into the lungs 2 (two) times daily.    [provider]  ipratropium-albuterol (DUONEB) 0.5-2.5 (3) MG/3ML SOLN Take 3 mLs by nebulization every 6 (six) hours. Patient taking differently: Take 3 mLs by nebulization every 6 (six) hours as needed (for wheezing/shortness of breath).  11/30/15   Fritzi Mandes,  MD  lisinopril (PRINIVIL,ZESTRIL) 5 MG tablet TAKE 1 TABLET(5 MG) BY MOUTH DAILY 08/05/17   [name] A, FNP  metoprolol tartrate (LOPRESSOR) 50 MG tablet Take 1 tablet (50 mg total) by mouth 2 (two) times daily. 09/09/17   Alisa Graff, FNP  montelukast (SINGULAIR) 10 MG tablet Take 10 mg by mouth at bedtime.     [provider]  Multiple Vitamin (MULTIVITAMIN) capsule Take 1 capsule by mouth daily.    [provider]  potassium chloride SA (K-DUR,KLOR-CON) 20 MEQ tablet Take 1  tablet (20 mEq total) by mouth daily. 01/09/17   Alisa Graff, FNP  tiotropium (SPIRIVA) 18 MCG inhalation capsule Place 18 mcg into inhaler and inhale daily.    [provider]  torsemide (DEMADEX) 20 MG tablet TAKE 2 TABLETS(40 MG) BY MOUTH DAILY 04/25/17   Alisa Graff, FNP   Physical Exam: Vitals:   11/27/17 1059  BP: 117/68  Pulse: 77  Resp: 18  SpO2: 95%  Weight: 175 lb (79.4 kg)  Height: 5\' 3"  (1.6 m)    Wt Readings from Last 3 Encounters:  11/27/17 175 lb (79.4 kg)  09/26/17 166 lb 6 oz (75.5 kg)  08/29/17 166 lb 8 oz (75.5 kg)    General:  Appears calm and comfortable Eyes: PERRL, normal lids, irises & conjunctiva ENT: grossly normal hearing, lips & tongue Neck: no LAD, masses or thyromegaly Cardiovascular: RRR, no m/r/g. No LE edema. Respiratory: CTA bilaterally, no w/r/r.       Normal respiratory effort. Abdomen: soft, nontender Skin: no rash or induration seen on limited exam Musculoskeletal: grossly normal tone BUE/BLE Psychiatric: grossly normal mood and affect Neurologic: grossly non-focal.          Labs on Admission:  Basic Metabolic Panel: No results for input(s): NA, K, CL, CO2, GLUCOSE, BUN, CREATININE, CALCIUM, MG, PHOS in the last 168 hours. Liver Function Tests: No results for input(s): AST, ALT, ALKPHOS, BILITOT, PROT, ALBUMIN in the last 168 hours. No results for input(s): LIPASE, AMYLASE in the last 168 hours. No results for input(s): AMMONIA in the last 168 hours. CBC: No results for input(s): WBC, NEUTROABS, HGB, HCT, MCV, PLT in the last 168 hours. Cardiac Enzymes: No results for input(s): CKTOTAL, CKMB, CKMBINDEX, TROPONINI in the last 168 hours.  BNP (last 3 results) Recent Labs    12/10/16 1912  BNP 1,483.0*    ProBNP (last 3 results) No results for input(s): PROBNP in the last 8760 hours.  CBG: No results for input(s): GLUCAP in the last 168 hours.  Radiological Exams on Admission: No results found.  EKG:  Independently reviewed.  Assessment/Plan Patient Active Problem List   Diagnosis Date Noted  . Chronic diastolic heart failure ([employer]) 02/26/2017  . Ascending aortic aneurysm ([place]) 01/11/2017  . Elevated troponin 12/13/2016  . Atrial fibrillation with RVR ([place]) 12/13/2016  . Diabetes ([place]) 12/13/2016  . Occasional tremors 08/07/2016  . Chronic systolic heart failure ([place]) 04/04/2016  . COPD (chronic obstructive pulmonary disease) with chronic bronchitis ([place]) 04/04/2016  . Obstructive sleep apnea 04/04/2016  . Bilateral pneumonia 01/24/2016  . Status post thoracentesis 01/24/2016  . Prediabetes 01/24/2016  . Swelling of lower extremity 01/24/2016  . Generalized weakness 01/24/2016  . Paroxysmal atrial fibrillation ([place]) 01/19/2016  . Essential hypertension 01/19/2016  . Recurrent right pleural effusion 01/19/2016  . HLD (hyperlipidemia) 01/19/2016  . Rapid atrial fibrillation ([place]) 11/28/2015     1. COPD mild exacerbation Mild exacerbation I think a short course  of steroids and abx should be reasonable She has noted increased sputum from baseline Would suggest increasing the O2 to 3lpm with exertion  2. Chronic respiratory failure with hypoxia 3lpm with exertion Monitor pulsox she has her own device  3. Sleep apnea She is doing well with her friend the CPAP  4. Atrial fibrillation Rate is controlled will monitor   Code Status: full code   Time spent: 98min  I have personally obtained a history, examined the patient, evaluated laboratory and imaging results, formulated the assessment and plan and placed orders.   Allyne Gee, MD [facility] Pulmonary Critical Care Medicine Sleep Medicine

## 2017-11-27 NOTE — Patient Instructions (Signed)

## 2017-11-28 ENCOUNTER — Ambulatory Visit: Payer: Self-pay | Admitting: Internal Medicine

## 2017-12-02 ENCOUNTER — Other Ambulatory Visit: Payer: Self-pay | Admitting: Family

## 2017-12-03 ENCOUNTER — Other Ambulatory Visit: Payer: Self-pay

## 2017-12-03 MED ORDER — ALBUTEROL SULFATE HFA 108 (90 BASE) MCG/ACT IN AERS: 2.0000 | INHALATION_SPRAY | RESPIRATORY_TRACT | 3 refills | Status: DC | PRN

## 2017-12-19 ENCOUNTER — Other Ambulatory Visit: Payer: Self-pay

## 2017-12-19 MED ORDER — TIOTROPIUM BROMIDE MONOHYDRATE 1.25 MCG/ACT IN AERS: 1.2500 ug | INHALATION_SPRAY | Freq: Every day | RESPIRATORY_TRACT | 3 refills | Status: AC

## 2017-12-26 ENCOUNTER — Ambulatory Visit: Payer: Self-pay

## 2017-12-27 ENCOUNTER — Other Ambulatory Visit: Payer: Self-pay

## 2017-12-27 ENCOUNTER — Ambulatory Visit: Payer: Medicare Other

## 2017-12-27 DIAGNOSIS — R0602 Shortness of breath: Secondary | ICD-10-CM

## 2018-01-03 ENCOUNTER — Other Ambulatory Visit: Payer: Self-pay

## 2018-01-03 MED ORDER — FLUTICASONE-SALMETEROL 250-50 MCG/DOSE IN AEPB: 1.0000 | INHALATION_SPRAY | Freq: Two times a day (BID) | RESPIRATORY_TRACT | 1 refills | Status: DC

## 2018-01-08 ENCOUNTER — Other Ambulatory Visit: Payer: Self-pay | Admitting: Family

## 2018-01-28 ENCOUNTER — Other Ambulatory Visit: Payer: Self-pay | Admitting: Internal Medicine

## 2018-02-06 ENCOUNTER — Ambulatory Visit: Payer: Self-pay | Admitting: Internal Medicine

## 2018-02-25 ENCOUNTER — Ambulatory Visit: Payer: Self-pay | Admitting: Internal Medicine

## 2018-02-25 ENCOUNTER — Other Ambulatory Visit: Payer: Self-pay | Admitting: Family

## 2018-02-26 ENCOUNTER — Ambulatory Visit (INDEPENDENT_AMBULATORY_CARE_PROVIDER_SITE_OTHER): Payer: Medicare Other | Admitting: Internal Medicine

## 2018-02-26 ENCOUNTER — Encounter: Payer: Self-pay | Admitting: Internal Medicine

## 2018-02-26 VITALS — BP 148/84 | HR 69 | Ht 63.0 in | Wt 179.8 lb

## 2018-02-26 DIAGNOSIS — J449 Chronic obstructive pulmonary disease, unspecified: Secondary | ICD-10-CM

## 2018-02-26 DIAGNOSIS — Z9989 Dependence on other enabling machines and devices: Secondary | ICD-10-CM

## 2018-02-26 DIAGNOSIS — G4733 Obstructive sleep apnea (adult) (pediatric): Secondary | ICD-10-CM | POA: Diagnosis not present

## 2018-02-26 DIAGNOSIS — I5032 Chronic diastolic (congestive) heart failure: Secondary | ICD-10-CM | POA: Diagnosis not present

## 2018-02-26 DIAGNOSIS — I480 Paroxysmal atrial fibrillation: Secondary | ICD-10-CM | POA: Diagnosis not present

## 2018-02-26 MED ORDER — ALBUTEROL SULFATE HFA 108 (90 BASE) MCG/ACT IN AERS: 2.0000 | INHALATION_SPRAY | Freq: Four times a day (QID) | RESPIRATORY_TRACT | 4 refills | Status: AC | PRN

## 2018-02-26 MED ORDER — LEVOFLOXACIN 500 MG PO TABS: 500.0000 mg | ORAL_TABLET | Freq: Every day | ORAL | 0 refills | Status: DC

## 2018-02-26 NOTE — Patient Instructions (Signed)

## 2018-02-26 NOTE — Progress Notes (Signed)
[facility] [employer], [place] [address]  Pulmonary Sleep Medicine  Office Visit Note  Patient Name: Elaine Decker DOB: 1946/01/03 MRN [id number]  Date of Service: 02/26/2018  Complaints/HPI:  She says that she has had a little bit of a decline in her status.  Has had more shortness of breath noted.  She continues to use oxygen.  She has been compliant with this therapy.  Right now she states no cough no congestion.  She has no wheezing noted.  Patient states that her insurance company is also requesting that she no longer use albuterol and we need to find a substitute for her  ROS  General: (-) fever, (-) chills, (-) night sweats, (-) weakness Skin: (-) rashes, (-) itching,. Eyes: (-) visual changes, (-) redness, (-) itching. Nose and Sinuses: (-) nasal stuffiness or itchiness, (-) postnasal drip, (-) nosebleeds, (-) sinus trouble. Mouth and Throat: (-) sore throat, (-) hoarseness. Neck: (-) swollen glands, (-) enlarged thyroid, (-) neck pain. Respiratory: + cough, (-) bloody sputum, + shortness of breath, - wheezing. Cardiovascular: - ankle swelling, (-) chest pain. Lymphatic: (-) lymph node enlargement. Neurologic: (-) numbness, (-) tingling. Psychiatric: (-) anxiety, (-) depression   Current Medication: Outpatient Encounter Medications as of 02/26/2018  Medication Sig Note  . acetaminophen (TYLENOL) 500 MG tablet Take 500-1,000 mg by mouth daily.   Marland Kitchen ADVAIR DISKUS 250-50 MCG/DOSE AEPB INHALE 1 PUFF INTO THE LUNGS TWICE DAILY   . albuterol (PROVENTIL HFA;VENTOLIN HFA) 108 (90 Base) MCG/ACT inhaler Inhale 2 puffs into the lungs every 4 (four) hours as needed for wheezing or shortness of breath.   Marland Kitchen atorvastatin (LIPITOR) 20 MG tablet Take 20 mg by mouth daily.   Marland Kitchen azithromycin (ZITHROMAX) 250 MG tablet 1 zpk as directed   . cholecalciferol (VITAMIN D) 1000 UNITS tablet Take 1,000 Units by mouth daily.   . dabigatran (PRADAXA) 150 MG CAPS capsule Take  150 mg by mouth 2 (two) times daily.   Marland Kitchen DILT-XR 120 MG 24 hr capsule TAKE 1 CAPSULE(120 MG) BY MOUTH DAILY   . ipratropium-albuterol (DUONEB) 0.5-2.5 (3) MG/3ML SOLN Take 3 mLs by nebulization every 6 (six) hours. (Patient taking differently: Take 3 mLs by nebulization every 6 (six) hours as needed (for wheezing/shortness of breath). ) 11/05/2016: Only uses once to twice a day.   . lisinopril (PRINIVIL,ZESTRIL) 5 MG tablet Take 1 tablet (5 mg total) by mouth daily.   . metoprolol tartrate (LOPRESSOR) 50 MG tablet Take 1 tablet (50 mg total) by mouth 2 (two) times daily.   . montelukast (SINGULAIR) 10 MG tablet Take 10 mg by mouth at bedtime.    . Multiple Vitamin (MULTIVITAMIN) capsule Take 1 capsule by mouth daily.   . potassium chloride SA (K-DUR,KLOR-CON) 20 MEQ tablet TAKE 1 TABLET(20 MEQ) BY MOUTH DAILY   . predniSONE (STERAPRED UNI-PAK 21 TAB) 10 MG (21) TBPK tablet 1 dose pack as directed   . tiotropium (SPIRIVA) 18 MCG inhalation capsule Place 18 mcg into inhaler and inhale daily.   . Tiotropium Bromide Monohydrate (SPIRIVA RESPIMAT) 1.25 MCG/ACT AERS Inhale 1.25 mcg into the lungs daily. 1 puff daily   . torsemide (DEMADEX) 20 MG tablet TAKE 2 TABLETS(40 MG) BY MOUTH DAILY    No facility-administered encounter medications on file as of 02/26/2018.     Surgical History: Past Surgical History:  Procedure Laterality Date  . FLEXIBLE BRONCHOSCOPY N/A 02/01/2016   Procedure: FLEXIBLE BRONCHOSCOPY;  Surgeon: Allyne Gee, MD;  Location:  [facility] ORS;  Service: Pulmonary;  Laterality: N/A;  . Surgery on the neck      Medical History: Past Medical History:  Diagnosis Date  . A-fib ([place])   . Asthma   . CHF (congestive heart failure) ([place])   . CKD (chronic kidney disease)   . COPD (chronic obstructive pulmonary disease) ([place])   . HLD (hyperlipidemia)   . Hypertension     Family History: Family History  Problem Relation Age of Onset  . CAD Mother   . Lung cancer Father   .  Arrhythmia Brother     Social History: Social History   Socioeconomic History  . Marital status: Divorced    Spouse name: Not on file  . Number of children: Not on file  . Years of education: Not on file  . Highest education level: Not on file  Social Needs  . Financial resource strain: Not on file  . Food insecurity - worry: Not on file  . Food insecurity - inability: Not on file  . Transportation needs - medical: Not on file  . Transportation needs - non-medical: Not on file  Occupational History  . Occupation: retired  Tobacco Use  . Smoking status: Former Smoker    Packs/day: 2.00    Years: 50.00    Pack years: 100.00    Last attempt to quit: 11/27/2015    Years since quitting: 2.2  . Smokeless tobacco: Never Used  Substance and Sexual Activity  . Alcohol use: No    Alcohol/week: 0.0 oz  . Drug use: No  . Sexual activity: Not on file  Other Topics Concern  . Not on file  Social History Narrative  . Not on file    Vital Signs: Blood pressure (!) 148/84, pulse 69, height 5\' 3"  (1.6 m), weight 179 lb 12.8 oz (81.6 kg), SpO2 95 %.  Examination: General Appearance: The patient is well-developed, well-nourished, and in no distress. Skin: Gross inspection of skin unremarkable. Head: normocephalic, no gross deformities. Eyes: no gross deformities noted. ENT: ears appear grossly normal no exudates. Neck: Supple. No thyromegaly. No LAD. Respiratory: rhonchi noted scattered bilateral. Cardiovascular: Normal S1 and S2 without murmur or rub. Extremities: No cyanosis. pulses are equal. Neurologic: Alert and oriented. No involuntary movements.  LABS: No results found for this or any previous visit (from the past 2160 hour(s)).  Radiology: Ct Angio Chest Aorta W/cm &/or Wo/cm  Result Date: 01/09/2017 CLINICAL DATA:  Followup thoracic aortic aneurysm. EXAM: CT ANGIOGRAPHY CHEST WITH CONTRAST TECHNIQUE: Multidetector CT imaging of the chest was performed using the  standard protocol during bolus administration of intravenous contrast. Multiplanar CT image reconstructions and MIPs were obtained to evaluate the vascular anatomy. CONTRAST:  85 mL Isovue 370. COMPARISON:  06/05/2016, 12/23/2015 FINDINGS: Cardiovascular: The thoracic aorta again demonstrates atherosclerotic calcifications. Mild aneurysmal dilatation of the ascending portion is noted. At the level of the main pulmonary artery it measures 4.1 x 3.9 cm in greatest dimension. This is roughly stable from the prior exams dating back to January of 2017. The aorta measures 3.7 cm at the level of the sinus of Valsalva and 3.0 cm at the sino-tubular junction. Normal tapering in the thoracic aorta is noted. Mild atherosclerotic changes are noted in the descending aorta. No significant aneurysmal dilatation is noted. Pulmonary artery demonstrates a normal branching pattern. Cardiac structures are enlarged. Mild coronary calcifications are seen. Mediastinum/Nodes: Thoracic inlet is within normal limits. Scattered small mediastinal lymph nodes are noted although not significant by size  criteria. A few small scattered hilar lymph nodes are noted also not significant by size criteria. These are stable from previous exams. Lungs/Pleura: Diffuse emphysematous changes of lungs are noted. Small right pleural effusion is noted. There are multiple somewhat irregular nodular densities identified throughout both lungs. The largest of these on the right lies in the upper lobe measuring 14 mm best seen on image number 49 of series 4. Largest of these on the left measures almost 2 cm lying in the anterior aspect of the left upper lobe best seen on image number 71 of series 4. These are new from the prior exam and previously seen nodular changes are resolved when compared with the prior study. These waxing and waning nodular infiltrates again suggest a atypical infectious process. Upper Abdomen: No acute abnormality. Musculoskeletal: No chest  wall abnormality. No acute or significant osseous findings. Review of the MIP images confirms the above findings. IMPRESSION: Stable appearing thoracic aortic aneurysm as described above. Recommend annual imaging followup by CTA or MRA. This recommendation follows 2010 ACCF/AHA/AATS/ACR/ASA/SCA/SCAI/SIR/STS/SVM Guidelines for the Diagnosis and Management of Patients with Thoracic Aortic Disease. Circulation. 2010; 121: U202-R427 Waxing and waning nodular infiltrates throughout both lungs worse on the left than the right. Previously seen nodular infiltrates have resolved from the prior exam of 05/2016. These changes again suggest an atypical infection. Short-term follow-up in 3 months is recommended to assess for resolution. No other focal abnormality is noted. Electronically Signed   By: Inez Catalina M.D.   On: 01/09/2017 11:26    No results found.  No results found.    Assessment and Plan: Patient Active Problem List   Diagnosis Date Noted  . Chronic diastolic heart failure ([place]) 02/26/2017  . Ascending aortic aneurysm ([place]) 01/11/2017  . Elevated troponin 12/13/2016  . Atrial fibrillation with RVR ([place]) 12/13/2016  . Diabetes ([place]) 12/13/2016  . Occasional tremors 08/07/2016  . Chronic systolic heart failure ([place]) 04/04/2016  . COPD (chronic obstructive pulmonary disease) with chronic bronchitis ([place]) 04/04/2016  . Obstructive sleep apnea 04/04/2016  . Bilateral pneumonia 01/24/2016  . Status post thoracentesis 01/24/2016  . Prediabetes 01/24/2016  . Swelling of lower extremity 01/24/2016  . Generalized weakness 01/24/2016  . Paroxysmal atrial fibrillation ([place]) 01/19/2016  . Essential hypertension 01/19/2016  . Recurrent right pleural effusion 01/19/2016  . HLD (hyperlipidemia) 01/19/2016  . Rapid atrial fibrillation ([place]) 11/28/2015    1.  COPD  She will continue with her current him hours.  I will send script Ventolin status will be overall apparently insurance will not cover  the generic form. 2. Obstructive sleep apnea  She is compliant with her CPAP patient an she is also using oxygen with CPAP machine at nighttime. 3. Paroxysmal atrial fibrillation  Rate is controlled we will continue with supportive care 4. Chronic diastolic heart failure currently compensated will follow along  General Counseling: I have discussed the findings of the evaluation and examination with Mardene Celeste.  I have also discussed any further diagnostic evaluation thatmay be needed or ordered today. Kaavya verbalizes understanding of the findings of todays visit. We also reviewed her medications today and discussed drug interactions and side effects including but not limited excessive drowsiness and altered mental states. We also discussed that there is always a risk not just to her but also people around her. she has been encouraged to call the office with any questions or concerns that should arise related to todays visit.    Time spent: 58min  I have personally obtained  a history, examined the patient, evaluated laboratory and imaging results, formulated the assessment and plan and placed orders.    Allyne Gee, MD [facility] Pulmonary and Critical Care Sleep medicine

## 2018-02-26 NOTE — Addendum Note (Signed)
Addended by: Devona Konig on: 02/26/2018 10:53 AM   Modules accepted: Orders

## 2018-03-21 ENCOUNTER — Telehealth: Payer: Self-pay

## 2018-03-21 NOTE — Telephone Encounter (Signed)
Pt called requesting [place] to write a letter to the city of [place].  Pt explained that they have bins that have to be moved to the road for trash and recycle pick up.  Pt has breathing issues and are getting worse and pt isn't able to move the bins well.  Letter is to get help for someone assist her in getting bins move to road for pick up.  Tat is writing letter for pt and I left vm on pt's phone that letter should be ready Tuesday morning.  dbs

## 2018-03-25 ENCOUNTER — Ambulatory Visit
Admission: RE | Admit: 2018-03-25 | Discharge: 2018-03-25 | Disposition: A | Discharge status: Home / Self Care | Payer: Medicare Other | Source: Ambulatory Visit | Attending: Cardiology | Admitting: Cardiology

## 2018-03-25 ENCOUNTER — Encounter
Admission: RE | Disposition: A | Discharge status: Home / Self Care | Payer: Self-pay | Source: Ambulatory Visit | Attending: Cardiology

## 2018-03-25 DIAGNOSIS — I480 Paroxysmal atrial fibrillation: Secondary | ICD-10-CM | POA: Diagnosis not present

## 2018-03-25 DIAGNOSIS — Z7901 Long term (current) use of anticoagulants: Secondary | ICD-10-CM | POA: Insufficient documentation

## 2018-03-25 DIAGNOSIS — I130 Hypertensive heart and chronic kidney disease with heart failure and stage 1 through stage 4 chronic kidney disease, or unspecified chronic kidney disease: Secondary | ICD-10-CM | POA: Diagnosis not present

## 2018-03-25 DIAGNOSIS — J449 Chronic obstructive pulmonary disease, unspecified: Secondary | ICD-10-CM | POA: Diagnosis not present

## 2018-03-25 DIAGNOSIS — Z87891 Personal history of nicotine dependence: Secondary | ICD-10-CM | POA: Insufficient documentation

## 2018-03-25 DIAGNOSIS — N189 Chronic kidney disease, unspecified: Secondary | ICD-10-CM | POA: Insufficient documentation

## 2018-03-25 DIAGNOSIS — E782 Mixed hyperlipidemia: Secondary | ICD-10-CM | POA: Diagnosis not present

## 2018-03-25 DIAGNOSIS — R55 Syncope and collapse: Secondary | ICD-10-CM | POA: Diagnosis not present

## 2018-03-25 DIAGNOSIS — I429 Cardiomyopathy, unspecified: Secondary | ICD-10-CM | POA: Insufficient documentation

## 2018-03-25 DIAGNOSIS — I272 Pulmonary hypertension, unspecified: Secondary | ICD-10-CM | POA: Insufficient documentation

## 2018-03-25 DIAGNOSIS — Z79899 Other long term (current) drug therapy: Secondary | ICD-10-CM | POA: Diagnosis not present

## 2018-03-25 DIAGNOSIS — I4891 Unspecified atrial fibrillation: Secondary | ICD-10-CM

## 2018-03-25 DIAGNOSIS — I5022 Chronic systolic (congestive) heart failure: Secondary | ICD-10-CM | POA: Diagnosis not present

## 2018-03-25 HISTORY — PX: LOOP RECORDER INSERTION: EP1214

## 2018-03-25 SURGERY — LOOP RECORDER INSERTION
Anesthesia: Moderate Sedation

## 2018-03-25 MED ORDER — LIDOCAINE-EPINEPHRINE 1 %-1:100000 IJ SOLN
INTRAMUSCULAR | Status: DC | PRN
  Administered 2018-03-25: 20 mL via INTRADERMAL

## 2018-03-25 MED ORDER — LIDOCAINE-EPINEPHRINE (PF) 1 %-1:200000 IJ SOLN
INTRAMUSCULAR | Status: AC
  Filled 2018-03-25: qty 30

## 2018-03-25 SURGICAL SUPPLY — 2 items
LOOP REVEAL LINQSYS (Prosthesis & Implant Heart) ×3 IMPLANT
PACK LOOP INSERTION (CUSTOM PROCEDURE TRAY) ×3 IMPLANT

## 2018-03-26 ENCOUNTER — Encounter: Payer: Self-pay | Admitting: Cardiology

## 2018-03-26 NOTE — Progress Notes (Signed)
Patient ID: Elaine Decker, female    DOB: Mar 22, 1946, 72 y.o.   MRN: [id number]  HPI  Elaine Decker is a 72 y/o female with a history of HTN, hyperlipidemia, COPD, obstructive sleep apnea, CKD, asthma, atrial fibrillation, prior tobacco use and chronic heart failure.  Last echo was done 11/06/16 and showed an EF of 60-65% with mild AR, moderate MR and severe TR. Mild to moderate pulmonary HTN was also present. EF has improved greatly from echo done 01/21/16 which showed an EF of 25-30%.  She has not been admitted or been in the ED in the last 6 months.   She presents today for a follow-up visit with a chief complaint of moderate shortness of breath upon minimal exertion. She describes this as chronic in nature having been present for several years but it has been worsening over the last few weeks/months. She has associated fatigue, cough, dizziness, tremors and syncopal episodes. She denies any difficulty sleeping, abdominal distention, palpitations, edema, chest pain, wheezing or weight gain. Has recently worn a holter monitor and just had a loop recorder inserted a couple of days ago. Is thinking of getting a second pulmonology opinion regarding her COPD.   Past Medical History:  Diagnosis Date  . A-fib ([place])   . Asthma   . CHF (congestive heart failure) ([place])   . CKD (chronic kidney disease)   . COPD (chronic obstructive pulmonary disease) ([place])   . HLD (hyperlipidemia)   . Hypertension    Past Surgical History:  Procedure Laterality Date  . FLEXIBLE BRONCHOSCOPY N/A 02/01/2016   Procedure: FLEXIBLE BRONCHOSCOPY;  Surgeon: Allyne Gee, MD;  Location: [facility] ORS;  Service: Pulmonary;  Laterality: N/A;  . LOOP RECORDER INSERTION N/A 03/25/2018   Procedure: LOOP RECORDER INSERTION;  Surgeon: Isaias Cowman, MD;  Location: [facility];  Service: Cardiovascular;  Laterality: N/A;  . Surgery on the neck     Family History  Problem Relation Age of Onset  . CAD Mother   . Lung cancer  Father   . Arrhythmia Brother    Social History   Tobacco Use  . Smoking status: Former Smoker    Packs/day: 2.00    Years: 50.00    Pack years: 100.00    Last attempt to quit: 11/27/2015    Years since quitting: 2.3  . Smokeless tobacco: Never Used  Substance Use Topics  . Alcohol use: No    Alcohol/week: 0.0 oz   No Known Allergies  Prior to Admission medications   Medication Sig Start Date End Date Taking? Authorizing Provider  acetaminophen (TYLENOL) 500 MG tablet Take 500-1,000 mg by mouth daily as needed for moderate pain or headache.    Yes [provider]  ADVAIR DISKUS 250-50 MCG/DOSE AEPB INHALE 1 PUFF INTO THE LUNGS TWICE DAILY 01/28/18  Yes Allyne Gee, MD  albuterol (VENTOLIN HFA) 108 (90 Base) MCG/ACT inhaler Inhale 2 puffs into the lungs every 6 (six) hours as needed for wheezing or shortness of breath. 02/26/18  Yes Allyne Gee, MD  atorvastatin (LIPITOR) 20 MG tablet Take 20 mg by mouth daily.   Yes [provider]  cholecalciferol (VITAMIN D) 1000 UNITS tablet Take 1,000 Units by mouth daily.   Yes [provider]  dabigatran (PRADAXA) 150 MG CAPS capsule Take 150 mg by mouth 2 (two) times daily.   Yes [provider]  DILT-XR 120 MG 24 hr capsule TAKE 1 CAPSULE(120 MG) BY MOUTH DAILY 02/25/18  Yes O'Neill,  Aura Fey, FNP  guaiFENesin (MUCINEX) 600 MG 12 hr tablet Take 600 mg by mouth daily as needed (congestion).   Yes [provider]  ipratropium-albuterol (DUONEB) 0.5-2.5 (3) MG/3ML SOLN Take 3 mLs by nebulization every 6 (six) hours. Patient taking differently: Take 3 mLs by nebulization every 6 (six) hours as needed (for wheezing/shortness of breath).  11/30/15  Yes Fritzi Mandes, MD  lisinopril (PRINIVIL,ZESTRIL) 5 MG tablet Take 1 tablet (5 mg total) by mouth daily. 02/25/18  Yes [name] A, FNP  metoprolol tartrate (LOPRESSOR) 50 MG tablet Take 1 tablet (50 mg total) by mouth 2 (two) times daily. 09/09/17  Yes  Peniel Biel A, FNP  montelukast (SINGULAIR) 10 MG tablet Take 10 mg by mouth at bedtime.    Yes [provider]  Multiple Vitamin (MULTIVITAMIN) capsule Take 1 capsule by mouth daily.   Yes [provider]  potassium chloride SA (K-DUR,KLOR-CON) 20 MEQ tablet TAKE 1 TABLET(20 MEQ) BY MOUTH DAILY 01/08/18  Yes [name] A, FNP  Tiotropium Bromide Monohydrate (SPIRIVA RESPIMAT) 1.25 MCG/ACT AERS Inhale 1.25 mcg into the lungs daily. 1 puff daily 12/19/17  Yes Ronnell [place], NP  torsemide (DEMADEX) 20 MG tablet TAKE 2 TABLETS(40 MG) BY MOUTH DAILY 12/02/17  Yes Alisa Graff, FNP    Review of Systems  Constitutional: Positive for fatigue. Negative for appetite change.  HENT: Negative for congestion, postnasal drip and sore throat.   Eyes: Negative.   Respiratory: Positive for cough and shortness of breath. Negative for chest tightness and wheezing.   Cardiovascular: Negative for chest pain, palpitations and leg swelling.  Gastrointestinal: Negative for abdominal distention and abdominal pain.  Endocrine: Negative.   Genitourinary: Negative.   Musculoskeletal: Negative for back pain and neck pain.  Skin: Negative.   Allergic/Immunologic: Negative.   Neurological: Positive for dizziness and tremors. Negative for light-headedness.  Hematological: Negative for adenopathy. Does not bruise/bleed easily.  Psychiatric/Behavioral: Negative for dysphoric mood, sleep disturbance (not sleeping well; sleeping on 1 pillow along with oxygen) and suicidal ideas. The patient is not nervous/anxious.    Vitals:   03/27/18 0855  BP: 124/72  Pulse: 62  Resp: 18  SpO2: 95%  Weight: 178 lb 2 oz (80.8 kg)  Height: 5\' 3"  (1.6 m)   Wt Readings from Last 3 Encounters:  03/27/18 178 lb 2 oz (80.8 kg)  03/25/18 179 lb (81.2 kg)  02/26/18 179 lb 12.8 oz (81.6 kg)   Lab Results  Component Value Date   CREATININE 0.83 12/13/2016   CREATININE 0.77 12/12/2016   CREATININE 0.69  12/11/2016   Physical Exam  Constitutional: She is oriented to person, place, and time. She appears well-developed and well-nourished.  HENT:  Head: Normocephalic and atraumatic.  Eyes: Pupils are equal, round, and reactive to light. Conjunctivae are normal.  Neck: Normal range of motion. Neck supple. No JVD present.  Cardiovascular: Normal rate and regular rhythm.  Pulmonary/Chest: [place] normal. She has no wheezes. She has no rhonchi. She has no rales.  Abdominal: Soft. She exhibits no distension. There is no tenderness.  Musculoskeletal: She exhibits no edema or tenderness.  Neurological: She is alert and oriented to person, place, and time.  Skin: Skin is warm and dry.  Psychiatric: She has a normal mood and affect. Her behavior is normal. Thought content normal.  Nursing note and vitals reviewed.   Assessment & Plan:  1: Chronic heart failure with preserved ejection fraction- - NYHA class III - euvolemic today - Continues  to weigh daily and says that her home weight has been stable.  Reminded to call for an overnight weight gain of >2 pounds or a weekly weight gain of >5 pounds. - weight unchanged from 02/26/18 - not adding salt to foods; Encouraged to closely follow a 2000mg  sodium diet - drinking around 50-60 ounces of fluid daily - saw cardiologist ([place]) 03/13/18 & returns in a couple of weeks - saw pulmonologist Humphrey Rolls) 02/26/18  - wore holter monitor 03/05/18 due to multiple syncopal episodes - Pharm D reconciled medications  2: HTN- - BP looks good today - Continue medications at this time - saw PCP Candiss Norse) 02/26/18 - BMP on 12/30/17 [facility]) reviewed and showed sodium 147, potassium 4.1 and GFR 55  3: Atrial fibrillation- - remains on dabigatran, diltiazem and metoprolol - Digoxin was discontinued in June 2018 - had loop recorder placed 03/25/18  4: Obstructive sleep apnea- - wearing oxygen at 2L around the clock - wears CPAP nightly   Patient did not  bring her medications nor a list. Each medication was verbally reviewed with the patient and she was encouraged to bring the bottles to every visit to confirm accuracy of list.  Return in 6 months or sooner for any questions/problems before then.

## 2018-03-27 ENCOUNTER — Ambulatory Visit: Payer: Medicare Other | Attending: Family | Admitting: Family

## 2018-03-27 ENCOUNTER — Encounter: Payer: Self-pay | Admitting: Family

## 2018-03-27 VITALS — BP 124/72 | HR 62 | Resp 18 | Ht 63.0 in | Wt 178.1 lb

## 2018-03-27 DIAGNOSIS — Z7901 Long term (current) use of anticoagulants: Secondary | ICD-10-CM | POA: Insufficient documentation

## 2018-03-27 DIAGNOSIS — Z87891 Personal history of nicotine dependence: Secondary | ICD-10-CM | POA: Insufficient documentation

## 2018-03-27 DIAGNOSIS — I130 Hypertensive heart and chronic kidney disease with heart failure and stage 1 through stage 4 chronic kidney disease, or unspecified chronic kidney disease: Secondary | ICD-10-CM | POA: Diagnosis present

## 2018-03-27 DIAGNOSIS — I5032 Chronic diastolic (congestive) heart failure: Secondary | ICD-10-CM

## 2018-03-27 DIAGNOSIS — I4891 Unspecified atrial fibrillation: Secondary | ICD-10-CM | POA: Diagnosis not present

## 2018-03-27 DIAGNOSIS — J449 Chronic obstructive pulmonary disease, unspecified: Secondary | ICD-10-CM | POA: Diagnosis not present

## 2018-03-27 DIAGNOSIS — N189 Chronic kidney disease, unspecified: Secondary | ICD-10-CM | POA: Diagnosis not present

## 2018-03-27 DIAGNOSIS — I509 Heart failure, unspecified: Secondary | ICD-10-CM | POA: Insufficient documentation

## 2018-03-27 DIAGNOSIS — G4733 Obstructive sleep apnea (adult) (pediatric): Secondary | ICD-10-CM | POA: Insufficient documentation

## 2018-03-27 DIAGNOSIS — Z79899 Other long term (current) drug therapy: Secondary | ICD-10-CM | POA: Diagnosis not present

## 2018-03-27 DIAGNOSIS — I10 Essential (primary) hypertension: Secondary | ICD-10-CM

## 2018-03-27 DIAGNOSIS — I480 Paroxysmal atrial fibrillation: Secondary | ICD-10-CM

## 2018-03-27 NOTE — Patient Instructions (Signed)
Continue weighing daily and call for an overnight weight gain of > 2 pounds or a weekly weight gain of >5 pounds. 

## 2018-05-17 DEATH — deceased

## 2018-07-03 ENCOUNTER — Ambulatory Visit: Payer: Self-pay | Admitting: Internal Medicine

## 2018-09-16 NOTE — Progress Notes (Deleted)
Patient ID: JACOBI RYANT, female    DOB: [birth year], 72 y.o.   MRN: [id number]  HPI  Ms Holsclaw is a 72 y/o female with a history of HTN, hyperlipidemia, COPD, obstructive sleep apnea, CKD, asthma, atrial fibrillation, prior tobacco use and chronic heart failure.  Last echo was done 11/06/16 and showed an EF of 60-65% with mild AR, moderate MR and severe TR. Mild to moderate pulmonary HTN was also present. EF has improved greatly from echo done 01/21/16 which showed an EF of 25-30%.  She has not been admitted or been in the ED in the last 6 months.   She presents today for a follow-up visit with a chief complaint of   Past Medical History:  Diagnosis Date  . A-fib ([place])   . Asthma   . CHF (congestive heart failure) ([place])   . CKD (chronic kidney disease)   . COPD (chronic obstructive pulmonary disease) ([place])   . HLD (hyperlipidemia)   . Hypertension    Past Surgical History:  Procedure Laterality Date  . FLEXIBLE BRONCHOSCOPY N/A 02/01/2016   Procedure: FLEXIBLE BRONCHOSCOPY;  Surgeon: Allyne Gee, MD;  Location: [facility];  Service: Pulmonary;  Laterality: N/A;  . LOOP RECORDER INSERTION N/A 03/25/2018   Procedure: LOOP RECORDER INSERTION;  Surgeon: Isaias Cowman, MD;  Location: [facility];  Service: Cardiovascular;  Laterality: N/A;  . Surgery on the neck     Family History  Problem Relation Age of Onset  . CAD Mother   . Lung cancer Father   . Arrhythmia Brother    Social History   Tobacco Use  . Smoking status: Former Smoker    Packs/day: 2.00    Years: 50.00    Pack years: 100.00    Last attempt to quit: 11/27/2015    Years since quitting: 2.8  . Smokeless tobacco: Never Used  Substance Use Topics  . Alcohol use: No    Alcohol/week: 0.0 standard drinks   No Known Allergies    Review of Systems  Constitutional: Positive for fatigue. Negative for appetite change.  HENT: Negative for congestion, postnasal drip and sore throat.   Eyes: Negative.    Respiratory: Positive for cough and shortness of breath. Negative for chest tightness and wheezing.   Cardiovascular: Negative for chest pain, palpitations and leg swelling.  Gastrointestinal: Negative for abdominal distention and abdominal pain.  Endocrine: Negative.   Genitourinary: Negative.   Musculoskeletal: Negative for back pain and neck pain.  Skin: Negative.   Allergic/Immunologic: Negative.   Neurological: Positive for dizziness and tremors. Negative for light-headedness.  Hematological: Negative for adenopathy. Does not bruise/bleed easily.  Psychiatric/Behavioral: Negative for dysphoric mood, sleep disturbance (not sleeping well; sleeping on 1 pillow along with oxygen) and suicidal ideas. The patient is not nervous/anxious.     Physical Exam  Constitutional: She is oriented to person, place, and time. She appears well-developed and well-nourished.  HENT:  Head: Normocephalic and atraumatic.  Eyes: Pupils are equal, round, and reactive to light. Conjunctivae are normal.  Neck: Normal range of motion. Neck supple. No JVD present.  Cardiovascular: Normal rate and regular rhythm.  Pulmonary/Chest: Effort normal. She has no wheezes. She has no rhonchi. She has no rales.  Abdominal: Soft. She exhibits no distension. There is no tenderness.  Musculoskeletal: She exhibits no edema or tenderness.  Neurological: She is alert and oriented to person, place, and time.  Skin: Skin is warm and dry.  Psychiatric: She has a normal mood and affect.  Her behavior is normal. Thought content normal.  Nursing note and vitals reviewed.   Assessment & Plan:  1: Chronic heart failure with preserved ejection fraction- - NYHA class III - euvolemic today - Continues to weigh daily and says that her home weight has been stable.  Reminded to call for an overnight weight gain of >2 pounds or a weekly weight gain of >5 pounds. - weight  - not adding salt to foods; Encouraged to closely follow a  2000mg  sodium diet - drinking around 50-60 ounces of fluid daily - saw cardiologist ([place]) 03/13/18 & returns in a couple of weeks - saw pulmonologist Humphrey Rolls) 02/26/18  -  - Pharm D reconciled medications  2: HTN- - BP  - Continue medications at this time - saw PCP Candiss Norse) 02/26/18 - BMP on 12/30/17 [facility]) reviewed and showed sodium 147, potassium 4.1 and GFR 55  3: Atrial fibrillation- - remains on dabigatran, diltiazem and metoprolol - Digoxin was discontinued in June 2018 - had loop recorder placed 03/25/18  4: Obstructive sleep apnea- - wearing oxygen at 2L around the clock - wears CPAP nightly   Patient did not bring her medications nor a list. Each medication was verbally reviewed with the patient and she was encouraged to bring the bottles to every visit to confirm accuracy of list.

## 2018-09-18 ENCOUNTER — Telehealth: Payer: Self-pay | Admitting: Family

## 2018-09-18 ENCOUNTER — Ambulatory Visit: Payer: Medicare Other | Admitting: Family

## 2018-09-18 NOTE — Telephone Encounter (Signed)
Patient did not show for her [facility] appointment on 09/18/18. Will attempt to reschedule.  

## 2018-09-25 ENCOUNTER — Ambulatory Visit: Payer: Medicare Other | Admitting: Family
